# Patient Record
Sex: Female | Born: 1975 | Race: White | Hispanic: No | Smoking: Never smoker
Health system: Southern US, Community
[De-identification: ages and names within clinical notes are randomized; demographics above are authoritative.]

## PROBLEM LIST (undated history)

## (undated) DIAGNOSIS — D721 Eosinophilia: Secondary | ICD-10-CM

## (undated) DIAGNOSIS — F419 Anxiety disorder, unspecified: Secondary | ICD-10-CM

## (undated) DIAGNOSIS — J302 Other seasonal allergic rhinitis: Secondary | ICD-10-CM

## (undated) DIAGNOSIS — T7840XA Allergy, unspecified, initial encounter: Secondary | ICD-10-CM

## (undated) DIAGNOSIS — K219 Gastro-esophageal reflux disease without esophagitis: Secondary | ICD-10-CM

## (undated) DIAGNOSIS — K279 Peptic ulcer, site unspecified, unspecified as acute or chronic, without hemorrhage or perforation: Secondary | ICD-10-CM

## (undated) DIAGNOSIS — G4730 Sleep apnea, unspecified: Secondary | ICD-10-CM

## (undated) DIAGNOSIS — F32A Depression, unspecified: Secondary | ICD-10-CM

## (undated) DIAGNOSIS — D7219 Other eosinophilia: Secondary | ICD-10-CM

## (undated) HISTORY — DX: Gastro-esophageal reflux disease without esophagitis: K21.9

## (undated) HISTORY — PX: TONSILLECTOMY: SUR1361

## (undated) HISTORY — PX: FRACTURE SURGERY: SHX138

## (undated) HISTORY — PX: FOOT SURGERY: SHX648

## (undated) HISTORY — DX: Other seasonal allergic rhinitis: J30.2

## (undated) HISTORY — DX: Eosinophilia: D72.1

## (undated) HISTORY — DX: Anxiety disorder, unspecified: F41.9

## (undated) HISTORY — DX: Allergy, unspecified, initial encounter: T78.40XA

## (undated) HISTORY — DX: Depression, unspecified: F32.A

## (undated) HISTORY — DX: Other eosinophilia: D72.19

## (undated) HISTORY — PX: NASAL SINUS SURGERY: SHX719

## (undated) HISTORY — DX: Sleep apnea, unspecified: G47.30

## (undated) HISTORY — DX: Peptic ulcer, site unspecified, unspecified as acute or chronic, without hemorrhage or perforation: K27.9

---

## 2007-03-18 ENCOUNTER — Encounter: Admission: RE | Admit: 2007-03-18 | Discharge: 2007-03-18 | Payer: Self-pay | Admitting: Otolaryngology

## 2007-05-08 ENCOUNTER — Ambulatory Visit (HOSPITAL_BASED_OUTPATIENT_CLINIC_OR_DEPARTMENT_OTHER): Admission: RE | Admit: 2007-05-08 | Discharge: 2007-05-08 | Payer: Self-pay | Admitting: Otolaryngology

## 2007-05-08 ENCOUNTER — Encounter (INDEPENDENT_AMBULATORY_CARE_PROVIDER_SITE_OTHER): Payer: Self-pay | Admitting: Otolaryngology

## 2009-08-17 ENCOUNTER — Inpatient Hospital Stay (HOSPITAL_COMMUNITY): Admission: RE | Admit: 2009-08-17 | Discharge: 2009-08-18 | Payer: Self-pay | Admitting: Orthopedic Surgery

## 2010-07-31 ENCOUNTER — Encounter: Payer: Self-pay | Admitting: Gastroenterology

## 2010-07-31 ENCOUNTER — Ambulatory Visit: Payer: Self-pay | Admitting: Gastroenterology

## 2010-07-31 DIAGNOSIS — K219 Gastro-esophageal reflux disease without esophagitis: Secondary | ICD-10-CM | POA: Insufficient documentation

## 2010-08-10 ENCOUNTER — Encounter: Payer: Self-pay | Admitting: Gastroenterology

## 2010-08-10 LAB — CONVERTED CEMR LAB
Alkaline Phosphatase: 59 units/L (ref 39–117)
BUN: 9 mg/dL (ref 6–23)
CO2: 20 meq/L (ref 19–32)
Creatinine, Ser: 0.82 mg/dL (ref 0.40–1.20)
Eosinophils Absolute: 0.6 10*3/uL (ref 0.0–0.7)
Eosinophils Relative: 8 % — ABNORMAL HIGH (ref 0–5)
Free T4: 1.07 ng/dL (ref 0.80–1.80)
Glucose, Bld: 101 mg/dL — ABNORMAL HIGH (ref 70–99)
HCT: 43.7 % (ref 36.0–46.0)
Hemoglobin: 13.9 g/dL (ref 12.0–15.0)
IgA: 202 mg/dL (ref 68–378)
Lymphocytes Relative: 29 % (ref 12–46)
Lymphs Abs: 2.3 10*3/uL (ref 0.7–4.0)
MCV: 92.2 fL (ref 78.0–100.0)
Monocytes Absolute: 0.8 10*3/uL (ref 0.1–1.0)
TSH: 1.066 microintl units/mL (ref 0.350–4.500)
Tissue Transglutaminase Ab, IgA: 13.2 units (ref <20)
Total Bilirubin: 0.6 mg/dL (ref 0.3–1.2)
Total Protein: 6.5 g/dL (ref 6.0–8.3)
WBC: 8 10*3/uL (ref 4.0–10.5)

## 2010-08-16 HISTORY — PX: ESOPHAGOGASTRODUODENOSCOPY: SHX1529

## 2010-09-10 ENCOUNTER — Ambulatory Visit: Payer: Self-pay | Admitting: Gastroenterology

## 2010-09-10 ENCOUNTER — Ambulatory Visit (HOSPITAL_COMMUNITY): Admission: RE | Admit: 2010-09-10 | Discharge: 2010-09-10 | Payer: Self-pay | Admitting: Gastroenterology

## 2010-12-25 ENCOUNTER — Encounter (INDEPENDENT_AMBULATORY_CARE_PROVIDER_SITE_OTHER): Payer: Self-pay | Admitting: *Deleted

## 2011-01-15 ENCOUNTER — Ambulatory Visit
Admission: RE | Admit: 2011-01-15 | Discharge: 2011-01-15 | Payer: Self-pay | Source: Home / Self Care | Attending: Urgent Care | Admitting: Urgent Care

## 2011-01-15 DIAGNOSIS — D721 Eosinophilia: Secondary | ICD-10-CM

## 2011-01-15 DIAGNOSIS — R898 Other abnormal findings in specimens from other organs, systems and tissues: Secondary | ICD-10-CM | POA: Insufficient documentation

## 2011-01-15 DIAGNOSIS — K279 Peptic ulcer, site unspecified, unspecified as acute or chronic, without hemorrhage or perforation: Secondary | ICD-10-CM | POA: Insufficient documentation

## 2011-01-15 NOTE — Letter (Signed)
Summary: egd order  egd order   Imported By: Rosine Beat 08/10/2010 14:43:36  _____________________________________________________________________  External Attachment:    Type:   Image     Comment:   External Document

## 2011-01-15 NOTE — Assessment & Plan Note (Signed)
Summary: evaluate hiatal hernia/ss   Visit Type:  Consult Referring Provider:  Warrick Parisian Primary Care Provider:  Velna Hatchet Stallings/[facility]  Chief Complaint:  hiatal hernia evaluation.  History of Present Illness: [name] is a pleasant 35 y/o female, patient of Dr. Creta Levin, who presents for further evaluation of chronic GERD/epigastric pain. She tells me she also has 6 month h/o change in bowels.  Chronic epigastric fullness noted especially with asthma episodes. Chronic belching/hiccups. Chronic heartburn. Some regurgitation. No n/v. No dysphagia. Been on various meds for past 6 years, but most recently on ranitidine. Saw Dr. Loreta Ave early 2011, for loose stools. Had bloodwork. She notes her diarrhea improved while on prednisone given for asthma. Used to have one solid stool a day. For six months, 4-5 loose stools with fecal urgency. No melena, brbpr. No nocturnal symptoms.   Current Medications (verified): 1)  Symbicort 160-4.5 Mcg/act Aero (Budesonide-Formoterol Fumarate) .... Two Times A Day 2)  Loratadine 10 Mg Tabs (Loratadine) .... Once Daily 3)  Ranitidine Hcl 150 Mg Tabs (Ranitidine Hcl) .... Two Times A Day 4)  Ventolin Hfa 108 (90 Base) Mcg/act Aers (Albuterol Sulfate) .... As Needed 5)  Neb Tx .... As Needed  Allergies (verified): 1)  ! Jonne Ply  Past History:  Past Medical History: Allergies Asthma Chronic GERD  Past Surgical History: Sinus surgery X 2 Right foot surgery, three screws, chronic fracture Tonsillectomy  Family History: Mother, colon polyps, GERD No FH of CRC, liver dz.  Social History: Married. No children. Never smoked. No alcohol. No drug. Works for [employer].   Review of Systems General:  Denies fever, chills, sweats, anorexia, fatigue, weakness, and weight loss. Eyes:  Denies vision loss. ENT:  Denies nasal congestion, sore throat, hoarseness, and difficulty swallowing. CV:  Denies chest pains,  angina, palpitations, dyspnea on exertion, and peripheral edema. Resp:  Complains of wheezing; denies cough and sputum. GI:  See HPI. GU:  Denies urinary burning and blood in urine. MS:  Denies joint pain / LOM. Derm:  Denies rash and itching. Neuro:  Denies weakness, frequent headaches, memory loss, and confusion. Psych:  Denies depression and anxiety. Endo:  Denies unusual weight change. Heme:  Denies bruising and bleeding. Allergy:  Denies hives and rash.  Vital Signs:  Patient profile:   35 year old female Height:      61 inches Weight:      207 pounds BMI:     39.25 Temp:     98.1 degrees F oral Pulse rate:   80 / minute BP sitting:   132 / 90  (left arm) Cuff size:   regular  Vitals Entered By: Hendricks Limes LPN (July 31, 2010 10:45 AM)  Physical Exam  General:  Well developed, well nourished, no acute distress. Head:  Normocephalic and atraumatic. Eyes:  sclera nonicteric Mouth:  Oropharyngeal mucosa moist, pink.  No lesions, erythema or exudate.    Neck:  Supple; no masses or thyromegaly. Lungs:  Clear throughout to auscultation. Heart:  Regular rate and rhythm; no murmurs, rubs,  or bruits. Abdomen:  Bowel sounds normal.  Abdomen is soft, nontender, nondistended.  No rebound or guarding.  No hepatosplenomegaly, masses or hernias.  No abdominal bruits. obese.   Extremities:  No clubbing, cyanosis, edema or deformities noted. Neurologic:  Alert and  oriented x4;  grossly normal neurologically. Skin:  Intact without significant lesions or rashes. Cervical Nodes:  No significant cervical adenopathy. Psych:  Alert and cooperative. Normal mood and affect.  Impression & Recommendations:  Problem # 1:  EPIGASTRIC PAIN (ICD-789.06) Chronic GERD/indigestion. Notes epigastric fullness and pressure especially with asthma episods. She has allergies. There is concern if GERD is contributing to her asthma or if she has hiatal hernia. Suspect all symptoms GERD related but gb  remains in differential as well. EGD to be performed in near future.  Risks, alternatives, benefits including but not limited to risk of reaction to medications, bleeding, infection, and perforation addressed.  Patient voiced understanding and verbal consent obtained.   Orders: Consultation Level III (04540) T-Comprehensive Metabolic Panel [phone]) T-CBC w/Diff ([id number])  Problem # 2:  DIARRHEA (ICD-787.91) Six month h/o diarrhea. Routine labs/stools. Will check celiac screen prior to EGD. Interestingly her diarrhea improved while on prednisone given for her asthma. ?underlying IBD. May eventually need colonoscopy.  Orders: Consultation Level III [phone]) T-TSH [phone]) T-Comprehensive Metabolic Panel [id number]) T-CBC w/Diff [id number]) T-Free T4 (23300) T-Tissue Transglutamase Ab IgA ([id number]) T-igA (33295) T-Culture, C-Diff Toxin A/B ([id number]) T-Stool for O&P ([id number]) T-Fecal WBC ([id number]) T-Culture, Stool ([id number])  I would like to thank Dr. Creta Levin for allowing [place] to take part in the care of this nice patient.  Appended Document: evaluate hiatal hernia/ss ADD two times a day PPI ANF SCHEDULE EGD/BRAVO CAPSULE PLACEMENT IN ONE MONTH. STOP THE RANITIDINE.  Appended Document: evaluate hiatal hernia/ss Please see [facility] orders above. Please have pt stop ranitidine. Start Nexium 40mg  by mouth two times a day (30 mins before meals), #60 2 refills. Schedule EGD/BRAVO in one month, instead of EGD now. Will await pending stools and labs.  Appended Document: evaluate hiatal hernia/ss tried to call pt- LMOM  Appended Document: evaluate hiatal hernia/ss pt aware, already scheduled for 09/10/10, called Kim and changed to egd/bravo. [facility]

## 2011-01-17 NOTE — Letter (Signed)
Summary: Recall Office Visit  [facility]  [address]   [place], [place] [address]   Phone: [phone]  Fax: [phone]      December 25, 2010   [facility] [address] [place], [place]  [address] [birth year]   Dear Ms. [facility],   According to our records, it is time for you to schedule a follow-up office visit with [place].   At your convenience, please call [phone] to schedule an office visit. If you have any questions, concerns, or feel that this letter is in error, we would appreciate your call.   Sincerely,    Diana Eves  [facility] Ph: [phone]   Fax: [phone]

## 2011-01-18 LAB — CONVERTED CEMR LAB
Eosinophils Absolute: 0.1 10*3/uL (ref 0.0–0.7)
Lymphs Abs: 3.7 10*3/uL (ref 0.7–4.0)
MCV: 92.9 fL (ref 78.0–100.0)
Neutro Abs: 10.7 10*3/uL — ABNORMAL HIGH (ref 1.7–7.7)
Neutrophils Relative %: 64 % (ref 43–77)
Platelets: 350 10*3/uL (ref 150–400)
RBC: 5.23 M/uL — ABNORMAL HIGH (ref 3.87–5.11)
WBC: 16.6 10*3/uL — ABNORMAL HIGH (ref 4.0–10.5)

## 2011-01-23 NOTE — Assessment & Plan Note (Signed)
Summary: PP FU IN 4 MONTHS.SS   Visit Type:  Follow-up Visit Referring Provider:  same Primary Care Provider:  Dr Warrick Parisian [facility] [place])  Chief Complaint:  follow up- doing ok.  History of Present Illness: Here for follow-up EGD.  Doing well on dexilant, but expensive w/ insurance.  Pt wants generic pantoprazole.  Denies any breakthough symptoms.  Trying to lose weight, but stable.  Appetite ok.  BM normal.  Denies dysphagia or odynophagia.  Asthma better.   EOS 8% on last CBC, hx asthma/allergies.  Current Problems (verified): 1)  Eosinophilia  (ICD-288.3) 2)  Gerd  (ICD-530.81)  Current Medications (verified): 1)  Symbicort 160-4.5 Mcg/act Aero (Budesonide-Formoterol Fumarate) .... Two Times A Day 2)  Loratadine 10 Mg Tabs (Loratadine) .... Once Daily 3)  Ranitidine Hcl 150 Mg Tabs (Ranitidine Hcl) .... Two Times A Day As Needed 4)  Ventolin Hfa 108 (90 Base) Mcg/act Aers (Albuterol Sulfate) .... As Needed 5)  Neb Tx .... As Needed 6)  Pantoprazole Sodium 40 Mg Tbec (Pantoprazole Sodium) .Marland Kitchen.. 1 By Mouth Daily For Acid Refulx  Allergies (verified): 1)  ! Jonne Ply  Past History:  Past Medical History: Allergies Asthma peripheral eosinophilia suspected secondary to above Chronic GERD PUD Last EGD 08/2010->Normal esophagus, 3 1-mm superficial gastric ulcers seen in the antrum, Diffuse erythema in the antrum. No evidence h pylori. Normal duodenal bulb, ampulla, and second portion of the duodenum.  Past Surgical History: Reviewed history from 07/31/2010 and no changes required. Sinus surgery X 2 Right foot surgery, three screws, chronic fracture Tonsillectomy  Family History: Reviewed history from 07/31/2010 and no changes required. Mother, colon polyps, GERD No FH of CRC, liver dz.  Social History: Reviewed history from 07/31/2010 and no changes required. Married. No children. Never smoked. No alcohol. No drug. Works for [employer].   Review  of Systems      See HPI General:  Denies fever, chills, sweats, anorexia, fatigue, weakness, malaise, weight loss, and sleep disorder. CV:  Denies chest pains, angina, palpitations, syncope, dyspnea on exertion, orthopnea, PND, peripheral edema, and claudication. Resp:  Denies dyspnea at rest, dyspnea with exercise, cough, sputum, wheezing, coughing up blood, and pleurisy. GI:  Denies difficulty swallowing, pain on swallowing, nausea, indigestion/heartburn, vomiting, vomiting blood, abdominal pain, jaundice, gas/bloating, diarrhea, constipation, change in bowel habits, bloody BM's, black BMs, and fecal incontinence. GU:  Denies urinary burning, blood in urine, nocturnal urination, urinary frequency, and urinary incontinence. MS:  Denies joint pain / LOM, joint swelling, joint stiffness, joint deformity, low back pain, muscle weakness, muscle cramps, muscle atrophy, leg pain at night, leg pain with exertion, and shoulder pain / LOM hand / wrist pain (CTS). Derm:  Denies rash, itching, dry skin, hives, moles, warts, and unhealing ulcers. Psych:  Denies depression, anxiety, memory loss, suicidal ideation, hallucinations, paranoia, phobia, and confusion. Heme:  Denies bruising, bleeding, and enlarged lymph nodes.  Vital Signs:  Patient profile:   35 year old female Height:      61 inches Weight:      205 pounds BMI:     38.87 Temp:     98.9 degrees F oral Pulse rate:   80 / minute BP sitting:   122 / 80  (left arm) Cuff size:   large  Vitals Entered By: Hendricks Limes LPN (January 15, 2011 10:35 AM)  Physical Exam  General:  Well developed, well nourished, no acute distress. Head:  Normocephalic and atraumatic. Eyes:  Sclera clear, no icterus. Ears:  Normal auditory acuity. Nose:  No deformity, discharge,  or lesions. Mouth:  No deformity or lesions, dentition normal. Lungs:  Clear throughout to auscultation. Heart:  Regular rate and rhythm; no murmurs, rubs,  or bruits. Abdomen:  Obese  Soft, nontender and nondistended. No masses, hepatosplenomegaly or hernias noted. Normal bowel sounds.without guarding and without rebound.   Msk:  Symmetrical with no gross deformities. Normal posture. Extremities:  No clubbing, cyanosis, edema or deformities noted. Neurologic:  Alert and  oriented x4;  grossly normal neurologically. Skin:  Intact without significant lesions or rashes. Cervical Nodes:  No significant cervical adenopathy. Psych:  Alert and cooperative. Normal mood and affect.  Impression & Recommendations:  Problem # 1:  GERD (ICD-530.81) Here for FU EGD.  Doing well, requests change in PPI for insurance purposes.  Orders: Est. Patient Level III (16010)  Problem # 2:  PUD (ICD-533.90) Doing well.  No evidence h pylori, no NSAIDs.  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  Other Orders: T-CBC w/Diff [id number])  Patient Instructions: 1)  Change to pantoprazole 2)  Call if any problems, otherwise office visit 1 yr Dr Darrick Penna 3)  Continue gradual weight loss Prescriptions: PANTOPRAZOLE SODIUM 40 MG TBEC (PANTOPRAZOLE SODIUM) 1 by mouth daily for acid refulx  #31 x 11   Entered and Authorized by:   Joselyn Arrow FNP-BC   Signed by:   Joselyn Arrow FNP-BC on 01/15/2011   Method used:   [facility]        [facility] [phone]* (retail)       [address]       [place], [place]  [address]       Ph: [phone] or [phone]       Fax: [phone]   RxID:   [id number]   Appended Document: PP FU IN 4 MONTHS.SS 1 YR F/U OPV IS IN THE COMPUTER

## 2011-03-22 LAB — COMPREHENSIVE METABOLIC PANEL
ALT: 23 U/L (ref 0–35)
AST: 26 U/L (ref 0–37)
CO2: 25 mEq/L (ref 19–32)
Chloride: 104 mEq/L (ref 96–112)
Creatinine, Ser: 0.81 mg/dL (ref 0.4–1.2)
GFR calc Af Amer: >60 mL/min (ref >60)
GFR calc non Af Amer: >60 mL/min (ref >60)
Glucose, Bld: 115 mg/dL — ABNORMAL HIGH (ref 70–99)
Sodium: 136 mEq/L (ref 135–145)
Total Bilirubin: 1 mg/dL (ref 0.3–1.2)

## 2011-03-22 LAB — CBC
Hemoglobin: 14.4 g/dL (ref 12.0–15.0)
MCV: 93.9 fL (ref 78.0–100.0)
RBC: 4.51 MIL/uL (ref 3.87–5.11)
WBC: 9.2 10*3/uL (ref 4.0–10.5)

## 2011-04-30 NOTE — Op Note (Signed)
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   PROCEDURE:  Right foot triple arthrodesis.   SURGEON:  Nadara Mustard, MD   ANESTHESIA:  General plus popliteal block.   ESTIMATED BLOOD LOSS:  Minimal.   ANTIBIOTICS:  Kefzol 1 g.   DRAINS:  None.   COMPLICATIONS:  None.   TOURNIQUET TIME:  Esmarch at the ankle for approximately 50 minutes.   DISPOSITION:  To PACU in stable condition.   INDICATIONS FOR PROCEDURE:  The patient is a 35 year old woman with  posterior tibial tendon insufficiency on the right.  She has essentially  no subtalar motion due to the posterior tibial insufficiency with  subtalar arthrosis.  She has a BMI greater than 40 and has failed  conservative care.  She presents at this time for right foot triple  arthrodesis.  Risks and benefits were discussed including infection,  neurovascular injury, persistent pain, nonunion, and need for additional  surgery.  The patient states she understands and wished proceed at this  time.   DESCRIPTION OF PROCEDURE:  The patient was brought to OR room-4.  After  undergoing a popliteal block, she then underwent a general anesthetic.  After adequate level of anesthesia was obtained, the patient's right  lower extremity was prepped using DuraPrep and draped into a sterile  field.  An oblique incision was made in line with the skin creases over  the sinus tarsi, this was carried sharply through the skin.  The  extensor  muscles were elevated from proximal to distal and reflected in  one block of tissue.  The calcaneocuboid joint was denuded of cartilage  and the subtalar joint was also debrided of cartilage.  This was  debrided back to bleeding viable subchondral bone.  A separate incision  was made over the anterior tibial tendon.  Blunt dissection was carried  down medial to the anterior tibial tendon and the talonavicular joint  was debrided of articular cartilage.  The wounds were irrigated with  normal saline.  After further debridement, the foot was then held at  neutral dorsiflexion with about 10 degrees of valgus and a guidewire was  inserted from the calcaneus into the talus, this was verified with C-arm  fluoroscopy.  The skin was incised and a 7.3 cannulated screw of 70 mm  in length was placed to stabilize the subtalar joint.  Attention was  then focused on the talonavicular joint with the first column and  alignment.  A 3.5 cortical screw was then used to stabilize the  talonavicular joint from the navicular to the  talus.  A separate small  stab incision was then made proximal to the lateral incision and a 2.5  drill bit was used to drill across the calcaneus into the cuboid and  this was then stabilized with a 30-mm 3.5 cortical screw.  A C-arm  fluoroscopy verified reduction in both AP and lateral planes.  The  wounds were irrigated with normal saline and the arthrodesis sites were  then packed with graft on demineralized bone matrix and a cancellus bone  chips.  The reflected extensor muscles were then reattached with 2-0  Vicryl reapproximating through the fascia.  The subcu was closed using 2-  0 Vicryl and the skin was closed using 2-0 nylon with a modified  vertical mattress suture.  The wounds were covered with Adaptic  orthopedic sponges, ABD dressing, Webril, and Coban.  The patient was  extubated and taken to PACU in stable condition.      Nadara Mustard, MD  Electronically  Signed     MVD/MEDQ  D:  08/17/2009  T:  08/18/2009  Job:  [id number]

## 2011-05-03 NOTE — Op Note (Signed)
Katrina Roman, Katrina Roman                ACCOUNT NO.:  [id number]   MEDICAL RECORD NO.:  [id number]          PATIENT TYPE:  AMB   LOCATION:  DSC                          FACILITY:  [facility]   PHYSICIAN:  Christopher E. 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   PROCEDURE:  Functional endoscopic sinus surgery with bilateral  ethmoidectomies with removal of sinonasal polyps bilaterally, maxillary  ostial enlargements with removal of sinonasal polyps, and bilateral  sphenoidotomies with removal of sinonasal polyps.   SURGEON:  Kristine Garbe. Ezzard Standing, M.D.   ANESTHESIA:  General endotracheal.   COMPLICATIONS:  None.   CLINICAL NOTE:  Katrina Roman is a 35 year old female with history of  allergies as well as history of nasal polyps.  She has had several  previous surgeries for recurrence of nasal polyps, the last time in [place]  in September of 2004.  She is having recurrent problems with  obstruction, infections, and headaches.  On exam, she has recurrence of  sinonasal polyps.  CT scan shows fairly extensive polyps involving  predominantly the ethmoid area bilaterally as well as obstructing the  sphenoid maxillary ostia, and she has total opacification of both  frontal sinuses.  She is taken to the operating room at this time for  endoscopic removal of sinonasal polyps.   DESCRIPTION OF PROCEDURE:  After adequate endotracheal anesthesia, the  nose was prepped with Betadine solution and draped out with sterile  towels.  The patient received 1 gram Ancef and 10 mg of Decadron IV  preoperatively.   Using the 0 and 30-degree scopes and the microdebrider, first the right  side was approached.  A portion of the right middle turbinate had  been  previously amputated, and some of it anteriorly was adhered to the  lateral ethmoid wall.  These adhesions were broken up, and the middle  turbinate was outfractured.  She had fairly extensive polyps involving  the ethmoid area extending back to a sphenoidotomy that had previously  been created.  Using the microdebrider, these polyps were removed.  The  maxillary ostia was identified, and this was still open but had several  polypoid disease around the ostia, and these were removed with a  microdebrider.  After cleaning out the ethmoid of the recurrent polyps  as well as the sphenoidotomy and maxillary ostia, the nasal frontal area  was approached.  Using the microdebrider and through-cut grasping  forceps.  The lower portion of the nasal frontal duct was opened up.  Because of previous scar tissue, it was a little bit difficult to  definitely identify definitive nasal frontal duct area, so just the  lower portion of nasal frontal area was opened up, and no probes were  really forced into the sinus region.  Next, the left side was  approached.  Again, Katrina Roman had several large recurrent polyps within the  anterior  and posterior ethmoid area as well as the maxillary ostia and  sphenoid ostia that had been previously created.  These polyps were  removed with the microdebrider.  Care was taken to preserve the lamina  and the roof of the ethmoid.  Sphenoid ostia was opened up by removing  the polyps obstructing the ostia as was the maxillary ostia.  Again, the  lower portion of the nasal frontal area was opened up with the  microdebrider.  There were a few anterior ethmoid cells that were opened  up likewise with up through-cut forceps.  This completed the procedure.  The nose was injected with 80 mg of Depo-Medrol at the completion of the  case.  Kennedy sinus packs were placed in the ethmoid areas bilaterally.  Of note, the lower portion of the nasal frontal areas were opened up   bilaterally, but the nasal frontal duct was not really explored into the  frontal sinus area.   Katrina Roman was awakened from anesthesia and transferred to the recovery room  postoperatively doing well.   DISPOSITION:  Katrina Roman is discharged home later this morning on Keflex 5 mg  b.i.d. for 1 week, Tylenol and Vicodin p.r.n. pain.  I will have her  followup in my office in 4 days for recheck to have the Kennedy sinus  packs removed.           ______________________________  Kristine Garbe Ezzard Standing, M.D.     CEN/MEDQ  D:  05/08/2007  T:  05/08/2007  Job:  [id number]   cc:   Ace Gins, MD

## 2011-12-23 ENCOUNTER — Encounter: Payer: Self-pay | Admitting: Gastroenterology

## 2014-05-12 ENCOUNTER — Institutional Professional Consult (permissible substitution): Payer: Self-pay | Admitting: Emergency Medicine

## 2014-05-17 ENCOUNTER — Encounter (INDEPENDENT_AMBULATORY_CARE_PROVIDER_SITE_OTHER): Payer: Self-pay

## 2014-05-17 ENCOUNTER — Encounter: Payer: Self-pay | Admitting: Emergency Medicine

## 2014-05-17 ENCOUNTER — Ambulatory Visit (INDEPENDENT_AMBULATORY_CARE_PROVIDER_SITE_OTHER): Payer: 59 | Admitting: Emergency Medicine

## 2014-05-17 VITALS — BP 132/80 | HR 109 | Temp 98.5°F | Ht 61.0 in | Wt 247.0 lb

## 2014-05-17 DIAGNOSIS — J45909 Unspecified asthma, uncomplicated: Secondary | ICD-10-CM

## 2014-05-17 DIAGNOSIS — R059 Cough, unspecified: Secondary | ICD-10-CM

## 2014-05-17 DIAGNOSIS — R05 Cough: Secondary | ICD-10-CM

## 2014-05-17 MED ORDER — HYDROCODONE-HOMATROPINE 5-1.5 MG/5ML PO SYRP: 5.0000 mL | ORAL_SOLUTION | Freq: Four times a day (QID) | ORAL | Status: DC | PRN

## 2014-05-17 NOTE — Assessment & Plan Note (Addendum)
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  - stop symbicort as a potential UA irritant - keep albuterol available to use prn - will work to manage GERD and PND more tightly - full PFT in the future once this flare has improved

## 2014-05-17 NOTE — Progress Notes (Signed)
Subjective:    Patient ID: Katrina Roman, female    DOB: Apr 18, 1976, 38 y.o.   MRN: [id number]  HPI 38 yo never smoker, hx allergies, GERD, asthma with peripheral eosinophilia, dx at age 82. Referred by Dr Deatra Ina for asthma. She has a hx of ASA sensitivity, also multiple sinus surgeries.  She has been less well controlled since Dec '14 when she may have had URI, has not been able to get back to baseline. At her baseline barely ever needs albuterol. Currently using it 2-3x a day.    Review of Systems  Constitutional: Negative for fever and unexpected weight change.  HENT: Negative for congestion, dental problem, ear pain, nosebleeds, postnasal drip, rhinorrhea, sinus pressure, sneezing, sore throat and trouble swallowing.   Eyes: Negative for redness and itching.  Respiratory: Positive for cough, shortness of breath and wheezing. Negative for chest tightness.   Cardiovascular: Negative for palpitations and leg swelling.  Gastrointestinal: Negative for nausea and vomiting.  Genitourinary: Negative for dysuria.  Musculoskeletal: Negative for joint swelling.  Skin: Negative for rash.  Neurological: Negative for headaches.  Hematological: Does not bruise/bleed easily.  Psychiatric/Behavioral: Negative for dysphoric mood. The patient is not nervous/anxious.    Past Medical History  Diagnosis Date  . Seasonal allergies   . Asthma   . Peripheral eosinophilia   . GERD (gastroesophageal reflux disease)   . PUD (peptic ulcer disease)      Family History  Problem Relation Age of Onset  . Skin cancer Maternal Grandfather   . Hypertension Father   . Hypertension Mother   . Diabetes Father      History   Social History  . Marital Status: Married    Spouse Name: N/A    Number of Children: N/A  . Years of Education: N/A   Occupational History  . [place] History Main Topics  . Smoking status: Never Smoker   . Smokeless tobacco: Not on file  .  Alcohol Use: No  . Drug Use: No  . Sexual Activity: Not on file   Other Topics Concern  . Not on file   Social History Narrative  . No narrative on file     Allergies  Allergen Reactions  . Aspirin     REACTION: affects asthma     Outpatient Prescriptions Prior to Visit  Medication Sig Dispense Refill  . albuterol (VENTOLIN HFA) 108 (90 BASE) MCG/ACT inhaler Inhale 2 puffs into the lungs every 6 (six) hours as needed.        . budesonide-formoterol (SYMBICORT) 160-4.5 MCG/ACT inhaler Inhale 2 puffs into the lungs 2 (two) times daily.        Marland Kitchen loratadine (CLARITIN) 10 MG tablet Take 10 mg by mouth daily.        . pantoprazole (PROTONIX) 40 MG tablet Take 40 mg by mouth daily.        . ranitidine (ZANTAC) 150 MG capsule Take 150 mg by mouth 2 (two) times daily.         No facility-administered medications prior to visit.         Objective:   Physical Exam Filed Vitals:   05/17/14 1619  BP: 132/80  Pulse: 109  Temp: 98.5 F (36.9 C)  TempSrc: Oral  Height: 5' 1"  (1.549 m)  Weight: 247 lb (112.038 kg)  SpO2: 96%   Gen: Pleasant, overwt, in no distress,  normal affect  ENT: No lesions,  mouth clear,  oropharynx clear,  some mild nasal congestion, hoarse voice  Neck: No JVD, no TMG, no carotid bruits, no stridor  Lungs: No use of accessory muscles, clear without rales or rhonchi, no wheeze  Cardiovascular: RRR, heart sounds normal, no murmur or gallops, no peripheral edema  Musculoskeletal: No deformities, no cyanosis or clubbing  Neuro: alert, non focal  Skin: Warm, no lesions or rashes     Assessment & Plan:  Intrinsic asthma Poorer control for the last 6 months - some of this sounds like UA sx and cyclical cough.  - stop symbicort as a potential UA irritant - keep albuterol available to use prn - will work to manage GERD and PND more tightly - full PFT in the future once this flare has improved  Cough - increase PPI temporarily, add NSW and nasal  steroid.  - discussed voice rest, may need to do this at some point - hycodan for cough suppression - consider FOB in the future if sx continue

## 2014-05-17 NOTE — Assessment & Plan Note (Signed)
-   increase PPI temporarily, add NSW and nasal steroid.  - discussed voice rest, may need to do this at some point - hycodan for cough suppression - consider FOB in the future if sx continue

## 2014-05-17 NOTE — Patient Instructions (Addendum)
Please stop Symbicort for now Continue to have your albuterol available to use as needed Increase your pantoprazole to twice a day for the next 2 weeks  Start nasonex 2 sprays each nostril daily until next visit Continue your zantac and loratadine as you are taking them Start nasal saline washes daily We will perform full pulmonary function testing in the future when your symptoms subside.  Use hycodan 5cc up to every 6 hours if needed for cough Follow with Dr Lamonte Sakai in 1 month

## 2014-05-23 ENCOUNTER — Telehealth: Payer: Self-pay | Admitting: Emergency Medicine

## 2014-05-23 MED ORDER — PREDNISONE 10 MG PO TABS: ORAL_TABLET | ORAL | Status: DC

## 2014-05-23 MED ORDER — DOXYCYCLINE HYCLATE 100 MG PO TABS: 100.0000 mg | ORAL_TABLET | Freq: Two times a day (BID) | ORAL | Status: DC

## 2014-05-23 NOTE — Telephone Encounter (Signed)
Please order  - pred taper: Take 16m daily for 3 days, then 353mdaily for 3 days, then 2061maily for 3 days, then 109m70mily for 3 days, then stop - doxycycline 100mg75m x 7 days - ask her to try using temporary symptomatic relief like OTC Tylenol cold & flu, etc.  - make sure we made a follow up visit within a month to review sx

## 2014-05-23 NOTE — Telephone Encounter (Signed)
Pt is aware of recs. RX sent in. Nothing further needed

## 2014-05-23 NOTE — Telephone Encounter (Signed)
Per OV 05/17/14: Patient Instructions      Please stop Symbicort for now Continue to have your albuterol available to use as needed Increase your pantoprazole to twice a day for the next 2 weeks   Start nasonex 2 sprays each nostril daily until next visit Continue your zantac and loratadine as you are taking them Start nasal saline washes daily We will perform full pulmonary function testing in the future when your symptoms subside.   Use hycodan 5cc up to every 6 hours if needed for cough Follow with Dr Lamonte Sakai in 1 month  ---  Called spoke with pt. C/o lots of nasal/chest cong, prod cough-yellow-green phlem, wheezing/chest tx. She had to do breathing TX this AM. Took mucinex couple weeks ago but did not help so stopped taking it. Still doing all recs as stated above. Please advise thanks  Allergies  Allergen Reactions  . Aspirin     REACTION: affects asthma

## 2014-06-13 ENCOUNTER — Other Ambulatory Visit: Payer: Self-pay | Admitting: Emergency Medicine

## 2014-06-13 ENCOUNTER — Telehealth: Payer: Self-pay | Admitting: Emergency Medicine

## 2014-06-13 NOTE — Telephone Encounter (Signed)
Spoke with pt and advised that pred taper was sent to pharm. Pt verbalized understanding and will call back if not better

## 2014-06-22 ENCOUNTER — Encounter: Payer: Self-pay | Admitting: Emergency Medicine

## 2014-06-22 ENCOUNTER — Ambulatory Visit (INDEPENDENT_AMBULATORY_CARE_PROVIDER_SITE_OTHER): Payer: 59 | Admitting: Emergency Medicine

## 2014-06-22 VITALS — BP 172/90 | HR 99 | Ht 61.0 in | Wt 249.4 lb

## 2014-06-22 DIAGNOSIS — J45909 Unspecified asthma, uncomplicated: Secondary | ICD-10-CM

## 2014-06-22 NOTE — Progress Notes (Addendum)
Subjective:    Patient ID: Katrina Roman, female    DOB: [birth year], 38 y.o.   MRN: [id number]  HPI 38 yo never smoker, hx allergies, GERD, asthma with peripheral eosinophilia, dx at age 16. Referred by Dr Deatra Ina for asthma. She has a hx of ASA sensitivity, also multiple sinus surgeries.  She has been less well controlled since Dec '14 when she may have had URI, has not been able to get back to baseline. At her baseline barely ever needs albuterol. Currently using it 2-3x a day.   ROV 06/22/14 -- follows up for asthma. Has had poor control, suspect UA irritation superimposed on her asthma. We increased her PPI, added nasal washes and steroid. She reports that she has been given pred a few times for persistent throat noise and mucous. She is on nasal steroid, NSW, PPI qd. Not on immunotherapy.   Review of Systems  Constitutional: Negative for fever and unexpected weight change.  HENT: Negative for congestion, dental problem, ear pain, nosebleeds, postnasal drip, rhinorrhea, sinus pressure, sneezing, sore throat and trouble swallowing.   Eyes: Negative for redness and itching.  Respiratory: Positive for cough, shortness of breath and wheezing. Negative for chest tightness.   Cardiovascular: Negative for palpitations and leg swelling.  Gastrointestinal: Negative for nausea and vomiting.  Genitourinary: Negative for dysuria.  Musculoskeletal: Negative for joint swelling.  Skin: Negative for rash.  Neurological: Negative for headaches.  Hematological: Does not bruise/bleed easily.  Psychiatric/Behavioral: Negative for dysphoric mood. The patient is not nervous/anxious.    Past Medical History  Diagnosis Date  . Seasonal allergies   . Asthma   . Peripheral eosinophilia   . GERD (gastroesophageal reflux disease)   . PUD (peptic ulcer disease)      Family History  Problem Relation Age of Onset  . Skin cancer Maternal Grandfather   . Hypertension Father   . Hypertension Mother   . Diabetes  Father      History   Social History  . Marital Status: Married    Spouse Name: N/A    Number of Children: N/A  . Years of Education: N/A   Occupational History  . [place] History Main Topics  . Smoking status: Never Smoker   . Smokeless tobacco: Not on file  . Alcohol Use: No  . Drug Use: No  . Sexual Activity: Not on file   Other Topics Concern  . Not on file   Social History Narrative  . No narrative on file     Allergies  Allergen Reactions  . Aspirin     REACTION: affects asthma     Outpatient Prescriptions Prior to Visit  Medication Sig Dispense Refill  . albuterol (VENTOLIN HFA) 108 (90 BASE) MCG/ACT inhaler Inhale 2 puffs into the lungs every 6 (six) hours as needed.        . budesonide-formoterol (SYMBICORT) 160-4.5 MCG/ACT inhaler Inhale 2 puffs into the lungs 2 (two) times daily.        Marland Kitchen loratadine (CLARITIN) 10 MG tablet Take 10 mg by mouth daily.        . pantoprazole (PROTONIX) 40 MG tablet Take 40 mg by mouth daily.        . predniSONE (DELTASONE) 10 MG tablet 4TABS X 3DAYS--3TABS X 3 DAYS--2TABS X 3 DAYS--1 TAB X 3 DAYS--THEN STOP  30 tablet  0  . ranitidine (ZANTAC) 150 MG capsule Take 150 mg by mouth 2 (two) times daily.        Marland Kitchen  doxycycline (VIBRA-TABS) 100 MG tablet Take 1 tablet (100 mg total) by mouth 2 (two) times daily.  14 tablet  0  . HYDROcodone-homatropine (HYCODAN) 5-1.5 MG/5ML syrup Take 5 mLs by mouth every 6 (six) hours as needed for cough.  240 mL  0   No facility-administered medications prior to visit.         Objective:   Physical Exam Filed Vitals:   06/22/14 1544  BP: 172/90  Pulse: 99  Height: 5' 1"  (1.549 m)  Weight: 249 lb 6.4 oz (113.127 kg)  SpO2: 96%   Gen: Pleasant, overwt, in no distress,  normal affect  ENT: No lesions,  mouth clear,  oropharynx clear, some mild nasal congestion, hoarse voice  Neck: No JVD, no TMG, no carotid bruits, no stridor  Lungs: No use of  accessory muscles, clear without rales or rhonchi, no wheeze  Cardiovascular: RRR, heart sounds normal, no murmur or gallops, no peripheral edema  Musculoskeletal: No deformities, no cyanosis or clubbing  Neuro: alert, non focal  Skin: Warm, no lesions or rashes     Assessment & Plan:  Intrinsic asthma She has asthma, but severity unclear to me - most of her recent problems have been consistent with UA irritation. No real improvement with GERD and allergy regimens. She may need to restart allergy shots. We will get full PFT, follow her sx, avoid throat clearing.   We will perform full Pulmonary Function Testing at your next visit Please continue your current medications as you are taking them Finish your prednisone as planned Try hard to avoid throat clearing and coughing Follow with Dr Lamonte Sakai next available with full PFT

## 2014-06-22 NOTE — Assessment & Plan Note (Addendum)
She has asthma, but severity unclear to me - most of her recent problems have been consistent with UA irritation. No real improvement with GERD and allergy regimens. She may need to restart allergy shots. We will get full PFT, follow her sx, avoid throat clearing.   We will perform full Pulmonary Function Testing at your next visit Please continue your current medications as you are taking them Finish your prednisone as planned Try hard to avoid throat clearing and coughing Follow with Dr Lamonte Sakai next available with full PFT

## 2014-06-22 NOTE — Patient Instructions (Signed)
We will perform full Pulmonary Function Testing at your next visit Please continue your current medications as you are taking them Finish your prednisone as planned Try hard to avoid throat clearing and coughing Follow with Dr Lamonte Sakai next available with full PFT

## 2014-06-29 ENCOUNTER — Telehealth: Payer: Self-pay | Admitting: Emergency Medicine

## 2014-06-29 MED ORDER — MONTELUKAST SODIUM 10 MG PO TABS: 10.0000 mg | ORAL_TABLET | Freq: Every day | ORAL | Status: DC

## 2014-06-29 MED ORDER — PREDNISONE 10 MG PO TABS: ORAL_TABLET | ORAL | Status: DC

## 2014-06-29 NOTE — Telephone Encounter (Signed)
Called and spoke with pt and she is aware of RB recs.  She stated that she has a pending appt with RB next month and will discuss at this time.

## 2014-06-29 NOTE — Telephone Encounter (Signed)
Ask her to take prednisone taper > Take 15m daily for 3 days, then 348mdaily for 3 days, then 2075maily for 3 days, then 8m1mily for 3 days, then stop. Also tell her that i believe we need to break the cycle of recurrent pred use. Would like to add singulair 8mg68m, and we will have to consider going back on allergy shots or a more extensive allergy workup

## 2014-06-29 NOTE — Telephone Encounter (Signed)
Pt returning caLL.Katrina Roman

## 2014-06-29 NOTE — Telephone Encounter (Signed)
Pt saw RB 06/22/14. Spoke with pt. She c/o increased chest cong causing her to have hard time to breathe. She is not coughing up any phlem. Congestion seems worse at bedtime. Also c/o chest tx, wheeszing. She has tried tylenol cold/sinus. Did breathing TX and does not help. Pt has finished prednisone. Please advise RB thanks  Allergies  Allergen Reactions  . Aspirin     REACTION: affects asthma     Current Outpatient Prescriptions on File Prior to Visit  Medication Sig Dispense Refill  . albuterol (VENTOLIN HFA) 108 (90 BASE) MCG/ACT inhaler Inhale 2 puffs into the lungs every 6 (six) hours as needed.        . budesonide-formoterol (SYMBICORT) 160-4.5 MCG/ACT inhaler Inhale 2 puffs into the lungs 2 (two) times daily.        Marland Kitchen loratadine (CLARITIN) 10 MG tablet Take 10 mg by mouth daily.        . pantoprazole (PROTONIX) 40 MG tablet Take 40 mg by mouth daily.        . predniSONE (DELTASONE) 10 MG tablet 4TABS X 3DAYS--3TABS X 3 DAYS--2TABS X 3 DAYS--1 TAB X 3 DAYS--THEN STOP  30 tablet  0  . ranitidine (ZANTAC) 150 MG capsule Take 150 mg by mouth 2 (two) times daily.         No current facility-administered medications on file prior to visit.

## 2014-06-29 NOTE — Telephone Encounter (Signed)
[id number] x1 

## 2014-07-19 ENCOUNTER — Telehealth: Payer: Self-pay | Admitting: Emergency Medicine

## 2014-07-19 NOTE — Telephone Encounter (Signed)
Called spoke with pt. appt scheduled to see MW in AM. Nothing further needed

## 2014-07-20 ENCOUNTER — Encounter: Payer: Self-pay | Admitting: Internal Medicine

## 2014-07-20 ENCOUNTER — Ambulatory Visit (INDEPENDENT_AMBULATORY_CARE_PROVIDER_SITE_OTHER): Payer: 59 | Admitting: Internal Medicine

## 2014-07-20 ENCOUNTER — Other Ambulatory Visit (INDEPENDENT_AMBULATORY_CARE_PROVIDER_SITE_OTHER): Payer: 59

## 2014-07-20 ENCOUNTER — Telehealth: Payer: Self-pay | Admitting: Internal Medicine

## 2014-07-20 VITALS — BP 120/82 | HR 108 | Temp 97.8°F | Ht 61.0 in | Wt 250.0 lb

## 2014-07-20 DIAGNOSIS — J45909 Unspecified asthma, uncomplicated: Secondary | ICD-10-CM

## 2014-07-20 LAB — CBC WITH DIFFERENTIAL/PLATELET
BASOS ABS: 0.1 10*3/uL (ref 0.0–0.1)
Basophils Relative: 0.8 % (ref 0.0–3.0)
EOS PCT: 7 % — AB (ref 0.0–5.0)
Eosinophils Absolute: 0.7 10*3/uL (ref 0.0–0.7)
HEMATOCRIT: 39.8 % (ref 36.0–46.0)
Hemoglobin: 13.1 g/dL (ref 12.0–15.0)
LYMPHS PCT: 23.3 % (ref 12.0–46.0)
Lymphs Abs: 2.4 10*3/uL (ref 0.7–4.0)
MCHC: 32.9 g/dL (ref 30.0–36.0)
MCV: 91.6 fl (ref 78.0–100.0)
MONOS PCT: 7 % (ref 3.0–12.0)
Monocytes Absolute: 0.7 10*3/uL (ref 0.1–1.0)
NEUTROS PCT: 61.9 % (ref 43.0–77.0)
Neutro Abs: 6.4 10*3/uL (ref 1.4–7.7)
PLATELETS: 333 10*3/uL (ref 150.0–400.0)
RBC: 4.34 Mil/uL (ref 3.87–5.11)
RDW: 14.1 % (ref 11.5–15.5)
WBC: 10.3 10*3/uL (ref 4.0–10.5)

## 2014-07-20 MED ORDER — MOMETASONE FURO-FORMOTEROL FUM 100-5 MCG/ACT IN AERO: INHALATION_SPRAY | RESPIRATORY_TRACT | Status: DC

## 2014-07-20 MED ORDER — RANITIDINE HCL 150 MG PO CAPS: 300.0000 mg | ORAL_CAPSULE | Freq: Every evening | ORAL | Status: DC

## 2014-07-20 MED ORDER — PREDNISONE 10 MG PO TABS: ORAL_TABLET | ORAL | Status: DC

## 2014-07-20 MED ORDER — PANTOPRAZOLE SODIUM 40 MG PO TBEC: DELAYED_RELEASE_TABLET | ORAL | Status: DC

## 2014-07-20 NOTE — Patient Instructions (Signed)
Prednisone 10 mg take  4 each am x 2 days,   2 each am x 2 days,  1 each am x 2 days and stop  Start trial of dulera 100 Take 2 puffs first thing in am and then another 2 puffs about 12 hours later.   Pantoprazole (protonix) 40 mg   Take 30-60 min before first meal of the day and Zantac 150 mg two at bedtime until return to office - this is the best way to tell whether stomach acid is contributing to your problem.    GERD (REFLUX)  is an extremely common cause of respiratory symptoms, many times with no significant heartburn at all.    It can be treated with medication, but also with lifestyle changes including avoidance of late meals, excessive alcohol, smoking cessation, and avoid fatty foods, chocolate, peppermint, colas, red wine, and acidic juices such as orange juice.  NO MINT OR MENTHOL PRODUCTS SO NO COUGH DROPS  USE SUGARLESS CANDY INSTEAD (jolley ranchers or Stover's)  NO OIL BASED VITAMINS - use powdered substitutes.    Keep appt with Dr Lamonte Sakai

## 2014-07-20 NOTE — Progress Notes (Signed)
Subjective:    Patient ID: Katrina Roman, female    DOB: [birth year]  MRN: [id number]  HPI 39 yo never smoker, hx allergies, GERD, asthma with peripheral eosinophilia, dx at age 63. Referred by Dr Deatra Ina for asthma. She has a hx of ASA sensitivity, also multiple sinus surgeries.  She has been less well controlled since Dec '14 when she may have had URI, has not been able to get back to baseline. At her baseline barely ever needs albuterol.    ROV 06/22/14 -- follows up for asthma. Has had poor control, suspect UA irritation superimposed on her asthma. We increased her PPI, added nasal washes and steroid. She reports that she has been given pred a few times for persistent throat noise and mucous. She is on nasal steroid, NSW, PPI qd. Not on immunotherapy.  rec We will perform full Pulmonary Function Testing at your next visit Please continue your current medications as you are taking them [facility] your prednisone as planned Try hard to avoid throat clearing and coughing Follow with Dr Lamonte Sakai next available with full P   07/20/2014  Acute ov/Wert re: severe cough responsive to steroids   Chief Complaint  Patient presents with  . Acute Visit    Pt c/o wheezing, chest tightness, SOB and cough since Dec 2014. Cough is non prod. Using rescue inhaler 3-4 x per day.   on prednisone no cough no wheeze no need for rescue including one month prior to OV  And benefit lasts a week then gradually declines - this is a new pattern since dec 2014. Cough remains dry and night = day  No obvious day to day or daytime variabilty or assoc  cp or chest tightness, subjective wheeze overt sinus or hb symptoms. No unusual exp hx or h/o childhood pna/  or knowledge of premature birth.   Also denies any obvious fluctuation of symptoms with weather or environmental changes or other aggravating or alleviating factors except as outlined above   Current Medications, Allergies, Complete Past Medical History, Past Surgical  History, Family History, and Social History were reviewed in [employer] record.  ROS  The following are not active complaints unless bolded sore throat, dysphagia, dental problems, itching, sneezing,  nasal congestion or excess/ purulent secretions, ear ache,   fever, chills, sweats, unintended wt loss, pleuritic or exertional cp, hemoptysis,  orthopnea pnd or leg swelling, presyncope, palpitations, heartburn, abdominal pain, anorexia, nausea, vomiting, diarrhea  or change in bowel or urinary habits, change in stools or urine, dysuria,hematuria,  rash, arthralgias, visual complaints, headache, numbness weakness or ataxia or problems with walking or coordination,  change in mood/affect or memory.       .    Past Medical History  Diagnosis Date  . Seasonal allergies   . Asthma   . Peripheral eosinophilia   . GERD (gastroesophageal reflux disease)   . PUD (peptic ulcer disease)      Family History  Problem Relation Age of Onset  . Skin cancer Maternal Grandfather   . Hypertension Father   . Hypertension Mother   . Diabetes Father      History   Social History  . Marital Status: Married    Spouse Name: N/A    Number of Children: N/A  . Years of Education: N/A   Occupational History  . [place] History Main Topics  . Smoking status: Never Smoker   . Smokeless tobacco: Not on file  .  Alcohol Use: No  . Drug Use: No  . Sexual Activity: Not on file   Other Topics Concern  . Not on file   Social History Narrative  . No narrative on file     Allergies  Allergen Reactions  . Aspirin     REACTION: affects asthma         Objective:   Physical Exam  Gen: Pleasant, overwt, in no distress,  normal affect/ harsh barking cough  ENT: No lesions,  mouth clear,  oropharynx clear, some mild nasal congestion, hoarse voice  Neck: No JVD, no TMG, no carotid bruits, no stridor  Lungs: No use of accessory muscles,  clear without rales or rhonchi, no wheeze  Cardiovascular: RRR, heart sounds normal, no murmur or gallops, no peripheral edema  Musculoskeletal: No deformities, no cyanosis or clubbing  Neuro: alert, non focal  Skin: Warm, no lesions or rashes     Assessment & Plan:

## 2014-07-20 NOTE — Telephone Encounter (Signed)
Called and spoke to [facility] at [facility]. [facility] awaiting to [facility] medications. Pt stated she never received the meds scripted from today's visit with MW. Ruthe Mannan was printed and pt stated she does not have the script. Meds were pred taper, protonix, zantac and dulera, verbal order given for all. Med list updated. Nothing further needed. Pt aware.

## 2014-07-21 ENCOUNTER — Encounter: Payer: Self-pay | Admitting: Internal Medicine

## 2014-07-21 LAB — ALLERGY FULL PROFILE
Alternaria Alternata: <0.1 kU/L
Bermuda Grass: <0.1 kU/L
Candida Albicans: <0.1 kU/L
Cat Dander: <0.1 kU/L
Curvularia lunata: <0.1 kU/L
Elm IgE: <0.1 kU/L
Fescue: 0.13 kU/L — ABNORMAL HIGH
G005 RYE, PERENNIAL: 0.12 kU/L — AB
G009 Red Top: 0.12 kU/L — ABNORMAL HIGH
Lamb's Quarters: <0.1 kU/L
Timothy Grass: 0.11 kU/L — ABNORMAL HIGH

## 2014-07-21 NOTE — Assessment & Plan Note (Addendum)
-   allergy profile 07/20/2014 :   Eos 7%,  IgE < 1.5  The proper method of use, as well as anticipated side effects, of a metered-dose inhaler are discussed and demonstrated to the patient. Improved effectiveness after extensive coaching during this visit to a level of approximately  75% from a baseline of < 25 %  ? Could she have eos bronchitis vs cough variant asthma that she hasn't treated effectively with topicals because her hfa is so poor at baseline  The problem is that may ics, esp in high doses, can contribute to cough.    rec trial of low dose dulera after a short course prednisone and max rx for acid and non acid gerd  See instructions for specific recommendations which were reviewed directly with the patient who was given a copy with highlighter outlining the key components.

## 2014-07-21 NOTE — Progress Notes (Signed)
Quick Note:  Spoke with pt and notified of results per Dr. Wert. Pt verbalized understanding and denied any questions.  ______ 

## 2014-08-05 ENCOUNTER — Ambulatory Visit (INDEPENDENT_AMBULATORY_CARE_PROVIDER_SITE_OTHER): Payer: 59 | Admitting: Emergency Medicine

## 2014-09-02 ENCOUNTER — Ambulatory Visit (INDEPENDENT_AMBULATORY_CARE_PROVIDER_SITE_OTHER): Payer: 59 | Admitting: Emergency Medicine

## 2014-09-02 ENCOUNTER — Encounter: Payer: Self-pay | Admitting: Emergency Medicine

## 2014-09-02 ENCOUNTER — Encounter: Payer: 59 | Admitting: Emergency Medicine

## 2014-09-02 VITALS — BP 118/78 | HR 100 | Ht 60.5 in | Wt 248.0 lb

## 2014-09-02 DIAGNOSIS — J4520 Mild intermittent asthma, uncomplicated: Secondary | ICD-10-CM

## 2014-09-02 DIAGNOSIS — J45909 Unspecified asthma, uncomplicated: Secondary | ICD-10-CM

## 2014-09-02 LAB — PULMONARY FUNCTION TEST
DL/VA % PRED: 115 %
DL/VA: 4.99 ml/min/mmHg/L
DLCO unc % pred: 115 %
DLCO unc: 22.59 ml/min/mmHg
FEF 25-75 POST: 2.55 L/s
FEF 25-75 Pre: 1.75 L/sec
FEF2575-%CHANGE-POST: 45 %
FEF2575-%PRED-POST: 85 %
FEF2575-%PRED-PRE: 58 %
FEV1-%Change-Post: 10 %
FEV1-%PRED-PRE: 83 %
FEV1-%Pred-Post: 92 %
FEV1-POST: 2.52 L
FEV1-PRE: 2.28 L
FEV1FVC-%CHANGE-POST: 6 %
FEV1FVC-%PRED-PRE: 90 %
FEV6-%CHANGE-POST: 4 %
FEV6-%PRED-POST: 97 %
FEV6-%Pred-Pre: 93 %
FEV6-Post: 3.16 L
FEV6-Pre: 3.04 L
FEV6FVC-%Pred-Post: 102 %
FEV6FVC-%Pred-Pre: 102 %
FVC-%Change-Post: 4 %
FVC-%PRED-POST: 95 %
FVC-%Pred-Pre: 91 %
FVC-POST: 3.16 L
FVC-Pre: 3.04 L
POST FEV1/FVC RATIO: 80 %
Post FEV6/FVC ratio: 100 %
Pre FEV1/FVC ratio: 75 %
Pre FEV6/FVC Ratio: 100 %
RV % PRED: 124 %
RV: 1.71 L
TLC % pred: 108 %
TLC: 4.9 L

## 2014-09-02 MED ORDER — AEROCHAMBER MV MISC: Status: DC

## 2014-09-02 NOTE — Progress Notes (Signed)
PFT done today. 

## 2014-09-02 NOTE — Patient Instructions (Signed)
Please continue your Ruthe Mannan twice a day with a spacer Use albuterol 2 puffs if needed for shortness of breath Continue your other medications as you have been taking them Follow with Dr Lamonte Sakai in 6 months or sooner if you have any problems

## 2014-09-02 NOTE — Assessment & Plan Note (Signed)
She is improved since the pred taper and the dulera. Will continue these. Continue the GERD and allergy regimens. Will give her a spacer. ROV 6

## 2014-09-02 NOTE — Progress Notes (Signed)
   Subjective:    Patient ID: Katrina Roman, female    DOB: 04/12/1976, 38 y.o.   MRN: [id number]  HPI 38 yo never smoker, hx allergies, GERD, asthma with peripheral eosinophilia, dx at age 24. Referred by Dr Deatra Ina for asthma. She has a hx of ASA sensitivity, also multiple sinus surgeries.  She has been less well controlled since Dec '14 when she may have had URI, has not been able to get back to baseline. At her baseline barely ever needs albuterol. Currently using it 2-3x a day.   ROV 06/22/14 -- follows up for asthma. Has had poor control, suspect UA irritation superimposed on her asthma. We increased her PPI, added nasal washes and steroid. She reports that she has been given pred a few times for persistent throat noise and mucous. She is on nasal steroid, NSW, PPI qd. Not on immunotherapy.   ROV 09/02/14 -- follow up for asthma, chronic cough in setting GERD and allergic rhinitis. She was seen by Dr Melvyn Novas a month ago with a dry barking cough. She was changed to [facility] and taught proper technique. Also a pred taper. Ruthe Mannan seems to have helped her.  PFT today > mild AFL with borderline BD response.   Review of Systems  Constitutional: Negative for fever and unexpected weight change.  HENT: Negative for congestion, dental problem, ear pain, nosebleeds, postnasal drip, rhinorrhea, sinus pressure, sneezing, sore throat and trouble swallowing.   Eyes: Negative for redness and itching.  Respiratory: Positive for cough, shortness of breath and wheezing. Negative for chest tightness.   Cardiovascular: Negative for palpitations and leg swelling.  Gastrointestinal: Negative for nausea and vomiting.  Genitourinary: Negative for dysuria.  Musculoskeletal: Negative for joint swelling.  Skin: Negative for rash.  Neurological: Negative for headaches.  Hematological: Does not bruise/bleed easily.  Psychiatric/Behavioral: Negative for dysphoric mood. The patient is not nervous/anxious.        Objective:   Physical Exam Filed Vitals:   09/02/14 1449  BP: 118/78  Pulse: 100  Height: 5' 0.5" (1.537 m)  Weight: 248 lb (112.492 kg)  SpO2: 96%   Gen: Pleasant, overwt, in no distress, normal affect  ENT: No lesions, mouth clear, oropharynx clear, some mild nasal congestion, hoarse voice  Neck: No JVD, no TMG, no carotid bruits, no stridor  Lungs: No use of accessory muscles, clear without rales or rhonchi, no wheeze  Cardiovascular: RRR, heart sounds normal, no murmur or gallops, no peripheral edema  Musculoskeletal: No deformities, no cyanosis or clubbing  Neuro: alert, non focal  Skin: Warm, no lesions or rashes      Assessment & Plan:  Intrinsic asthma She is improved since the pred taper and the dulera. Will continue these. Continue the GERD and allergy regimens. Will give her a spacer. ROV 6

## 2014-09-03 ENCOUNTER — Other Ambulatory Visit: Payer: Self-pay | Admitting: Internal Medicine

## 2014-10-04 ENCOUNTER — Other Ambulatory Visit: Payer: Self-pay | Admitting: Internal Medicine

## 2015-02-17 ENCOUNTER — Telehealth: Payer: Self-pay | Admitting: *Deleted

## 2015-02-17 NOTE — Telephone Encounter (Signed)
Called for PA singulair. This was approved for lifetime through plan. Called [facility] and made aware. Nothing further needed

## 2015-04-11 ENCOUNTER — Other Ambulatory Visit: Payer: Self-pay | Admitting: Emergency Medicine

## 2015-05-10 ENCOUNTER — Other Ambulatory Visit: Payer: Self-pay | Admitting: Emergency Medicine

## 2015-06-05 ENCOUNTER — Other Ambulatory Visit: Payer: Self-pay | Admitting: Emergency Medicine

## 2015-06-08 ENCOUNTER — Other Ambulatory Visit: Payer: Self-pay | Admitting: Internal Medicine

## 2015-06-08 ENCOUNTER — Other Ambulatory Visit: Payer: Self-pay | Admitting: Emergency Medicine

## 2015-08-24 ENCOUNTER — Encounter (HOSPITAL_COMMUNITY): Payer: Self-pay | Admitting: Emergency Medicine

## 2015-08-24 ENCOUNTER — Emergency Department (HOSPITAL_COMMUNITY)
Admission: EM | Admit: 2015-08-24 | Discharge: 2015-08-24 | Disposition: A | Discharge status: Home / Self Care | Payer: BLUE CROSS/BLUE SHIELD | Attending: Emergency Medicine | Admitting: Emergency Medicine

## 2015-08-24 DIAGNOSIS — Z79899 Other long term (current) drug therapy: Secondary | ICD-10-CM | POA: Insufficient documentation

## 2015-08-24 DIAGNOSIS — J45909 Unspecified asthma, uncomplicated: Secondary | ICD-10-CM | POA: Diagnosis not present

## 2015-08-24 DIAGNOSIS — R42 Dizziness and giddiness: Secondary | ICD-10-CM | POA: Diagnosis present

## 2015-08-24 DIAGNOSIS — K219 Gastro-esophageal reflux disease without esophagitis: Secondary | ICD-10-CM | POA: Insufficient documentation

## 2015-08-24 DIAGNOSIS — Z862 Personal history of diseases of the blood and blood-forming organs and certain disorders involving the immune mechanism: Secondary | ICD-10-CM | POA: Diagnosis not present

## 2015-08-24 DIAGNOSIS — J0100 Acute maxillary sinusitis, unspecified: Secondary | ICD-10-CM

## 2015-08-24 DIAGNOSIS — J40 Bronchitis, not specified as acute or chronic: Secondary | ICD-10-CM

## 2015-08-24 MED ORDER — AZITHROMYCIN 250 MG PO TABS: ORAL_TABLET | ORAL | Status: DC

## 2015-08-24 MED ORDER — PREDNISONE 50 MG PO TABS: 50.0000 mg | ORAL_TABLET | Freq: Every day | ORAL | Status: DC

## 2015-08-24 NOTE — ED Notes (Signed)
Pt states lightheadedness began about a month ago, worsening since then. Onset of cold symptoms on Monday brought worsening dizziness. Pt c/o lightheadedness when coughing.

## 2015-08-24 NOTE — ED Notes (Signed)
MD at bedside. 

## 2015-08-24 NOTE — Discharge Instructions (Signed)
Increase fluids.  Prescription for antibiotic and prednisone. Tylenol for fever.

## 2015-08-24 NOTE — ED Notes (Signed)
Pt has hx of sinus infections, feels congested with clear drainage. Also complaining of dizziness starting yesterday, especially upon standing. No LOC reported.

## 2015-08-24 NOTE — ED Provider Notes (Signed)
CSN: [id number]     Arrival date & time 08/24/15  0702 History  This chart was scribed for Nat Christen, MD by Irene Pap, ED Scribe. This patient was seen in room APA04/APA04 and patient care was started at 8:43 AM.      Chief Complaint  Patient presents with  . Dizziness   The history is provided by the patient. No language interpreter was used.   HPI Comments: Katrina Roman is a 39 y.o. Female with hx of seasonal allergies, sinus infections and asthma who presents to the Emergency Department complaining of lightheadedness, cough, fever, fullness in her ears, throat scratchiness for 1 week. She has tried over-the-counter products with minimal relief. Severity is mild to moderate.  Past Medical History  Diagnosis Date  . Seasonal allergies   . Asthma   . Peripheral eosinophilia   . GERD (gastroesophageal reflux disease)   . PUD (peptic ulcer disease)    Past Surgical History  Procedure Laterality Date  . Esophagogastroduodenoscopy  08/2010    noraml esophagus,3 1-mm superficial gastric ulcers seen in the antrum, No evidence H pylori  . Nasal sinus surgery  1997 and 2009    x 2  . Foot surgery      Right; three screws,chronic frature  . Tonsillectomy     Family History  Problem Relation Age of Onset  . Skin cancer Maternal Grandfather   . Hypertension Father   . Hypertension Mother   . Diabetes Father    Social History  Substance Use Topics  . Smoking status: Never Smoker   . Smokeless tobacco: None  . Alcohol Use: No   OB History    No data available     Review of Systems  HENT: Positive for congestion and postnasal drip.   Neurological: Negative for syncope.  All other systems reviewed and are negative.  Allergies  Aspirin  Home Medications   Prior to Admission medications   Medication Sig Start Date End Date Taking? Authorizing Provider  albuterol (VENTOLIN HFA) 108 (90 BASE) MCG/ACT inhaler Inhale 2 puffs into the lungs every 6 (six) hours as needed.       Historical Provider, MD  azithromycin (ZITHROMAX) 250 MG tablet 2 tablets day 1, 1 tablet day 2 through 5 08/24/15   Nat Christen, MD  loratadine (CLARITIN) 10 MG tablet Take 10 mg by mouth daily.      Historical Provider, MD  mometasone-formoterol (DULERA) 100-5 MCG/ACT AERO Take 2 puffs first thing in am and then another 2 puffs about 12 hours later. 07/20/14   Tanda Rockers, MD  montelukast (SINGULAIR) 10 MG tablet TAKE 1 TABLET (10 MG TOTAL) BY MOUTH AT BEDTIME. 06/09/15   Collene Gobble, MD  pantoprazole (PROTONIX) 40 MG tablet TAKE 1 TABLET BY MOUTH 30 TO 60 MINUTES BEFORE 1ST MEAL OF THE DAY 06/09/15   Collene Gobble, MD  predniSONE (DELTASONE) 50 MG tablet Take 1 tablet (50 mg total) by mouth daily. 08/24/15   Nat Christen, MD  ranitidine (ZANTAC) 150 MG capsule Take 2 capsules (300 mg total) by mouth every evening. 07/20/14   Tanda Rockers, MD  Spacer/Aero-Holding Chambers (AEROCHAMBER MV) inhaler Use as instructed 09/02/14   Collene Gobble, MD   BP 128/84 mmHg  Pulse 98  Temp(Src) 98.3 F (36.8 C) (Oral)  Resp 16  Ht 5' 1"  (1.549 m)  Wt 220 lb (99.791 kg)  BMI 41.59 kg/m2  SpO2 95%   Physical Exam  Constitutional: She is  oriented to person, place, and time. She appears well-developed and well-nourished.  HENT:  Head: Normocephalic and atraumatic.  Minimal maxillary sinus tenderness  Eyes: Conjunctivae and EOM are normal. Pupils are equal, round, and reactive to light.  Neck: Normal range of motion. Neck supple.  Cardiovascular: Normal rate and regular rhythm.   Pulmonary/Chest: Effort normal and breath sounds normal.  Abdominal: Soft. Bowel sounds are normal.  Musculoskeletal: Normal range of motion.  Neurological: She is alert and oriented to person, place, and time.  Skin: Skin is warm and dry.  Psychiatric: She has a normal mood and affect. Her behavior is normal.  Nursing note and vitals reviewed.   ED Course  Procedures (including critical care time) DIAGNOSTIC  STUDIES: Oxygen Saturation is 95% on RA, normal by my interpretation.    COORDINATION OF CARE: 8:43 AM-Discussed treatment plan with patient  Labs Review Labs Reviewed - No data to display  Imaging Review No results found.    EKG Interpretation None      MDM   Final diagnoses:  Acute maxillary sinusitis, recurrence not specified  Bronchitis  Patient is in no acute distress. Symptoms have been persistent for 1 week. Rx Zithromax and prednisone   I personally performed the services described in this documentation, which was scribed in my presence. The recorded information has been reviewed and is accurate.    Nat Christen, MD 08/24/15 [phone]

## 2015-10-04 ENCOUNTER — Encounter: Payer: Self-pay | Admitting: Emergency Medicine

## 2015-10-04 ENCOUNTER — Encounter: Payer: Self-pay | Admitting: *Deleted

## 2015-10-04 ENCOUNTER — Ambulatory Visit (INDEPENDENT_AMBULATORY_CARE_PROVIDER_SITE_OTHER): Payer: BLUE CROSS/BLUE SHIELD | Admitting: Emergency Medicine

## 2015-10-04 ENCOUNTER — Ambulatory Visit: Payer: BLUE CROSS/BLUE SHIELD | Admitting: Emergency Medicine

## 2015-10-04 VITALS — BP 110/70 | HR 102 | Ht 61.0 in | Wt 229.0 lb

## 2015-10-04 DIAGNOSIS — K219 Gastro-esophageal reflux disease without esophagitis: Secondary | ICD-10-CM | POA: Diagnosis not present

## 2015-10-04 DIAGNOSIS — J4520 Mild intermittent asthma, uncomplicated: Secondary | ICD-10-CM | POA: Diagnosis not present

## 2015-10-04 DIAGNOSIS — J309 Allergic rhinitis, unspecified: Secondary | ICD-10-CM | POA: Diagnosis not present

## 2015-10-04 MED ORDER — FLUTICASONE PROPIONATE 50 MCG/ACT NA SUSP: 2.0000 | Freq: Every day | NASAL | Status: DC

## 2015-10-04 MED ORDER — PREDNISONE 10 MG PO TABS: ORAL_TABLET | ORAL | Status: DC

## 2015-10-04 NOTE — Assessment & Plan Note (Signed)
Continue nasal saline washes, Singulair, loratadine. We will add a nasal steroid.

## 2015-10-04 NOTE — Patient Instructions (Addendum)
Please continue your Dulera, singulair, loratadine, nasal saline as you have been using them  Please take prednisone as directed to completion Continue your pantoprazole and ranitidine Follow with Dr Lamonte Sakai in 2 months or sooner if you have any problems.

## 2015-10-04 NOTE — Progress Notes (Signed)
Subjective:    Patient ID: Katrina Roman, female    DOB: 07-30-1976, 39 y.o.   MRN: [id number]  Asthma She complains of cough, shortness of breath and wheezing. Pertinent negatives include no ear pain, fever, headaches, postnasal drip, rhinorrhea, sneezing, sore throat or trouble swallowing. Her past medical history is significant for asthma.   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 Referred by Dr Deatra Ina for asthma. She has a hx of ASA sensitivity, also multiple sinus surgeries.  She has been less well controlled since Dec '14 when she may have had URI, has not been able to get back to baseline. At her baseline barely ever needs albuterol. Currently using it 2-3x a day.   ROV 06/22/14 -- follows up for asthma. Has had poor control, suspect UA irritation superimposed on her asthma. We increased her PPI, added nasal washes and steroid. She reports that she has been given pred a few times for persistent throat noise and mucous. She is on nasal steroid, [place], PPI qd. Not on immunotherapy.   ROV 09/02/14 -- follow up for asthma, chronic cough in setting GERD and allergic rhinitis. She was seen by Dr Melvyn Novas a month ago with a dry barking cough. She was changed to [facility] and taught proper technique. Also a pred taper. Ruthe Mannan seems to have helped her.  PFT today > mild AFL with borderline BD response.   ROV 10/04/15 -- follow-up visit for chronic cough and asthma both exacerbated by GERD and allergic rhinitis. She has been doing quite well, but did have an AE mid-September as allergy sx began > was treated with pred + azithro.  She has a new house for the last 7 months.  Uses albuterol about twice a day.    Review of Systems  Constitutional: Negative for fever and unexpected weight change.  HENT: Negative for congestion, dental problem, ear pain, nosebleeds, postnasal drip, rhinorrhea, sinus pressure, sneezing, sore throat and trouble swallowing.   Eyes:  Negative for redness and itching.  Respiratory: Positive for cough, shortness of breath and wheezing. Negative for chest tightness.   Cardiovascular: Negative for palpitations and leg swelling.  Gastrointestinal: Negative for nausea and vomiting.  Genitourinary: Negative for dysuria.  Musculoskeletal: Negative for joint swelling.  Skin: Negative for rash.  Neurological: Negative for headaches.  Hematological: Does not bruise/bleed easily.  Psychiatric/Behavioral: Negative for dysphoric mood. The patient is not nervous/anxious.        Objective:   Physical Exam Filed Vitals:   10/04/15 1458  BP: 110/70  Pulse: 102  Height: 5' 1"  (1.549 m)  Weight: 229 lb (103.874 kg)  SpO2: 98%   Gen: Pleasant, overwt, in no distress, normal affect  ENT: No lesions, mouth clear, oropharynx clear, some mild nasal congestion, hoarse voice  Neck: No JVD, no TMG, no carotid bruits, no stridor  Lungs: No use of accessory muscles, clear without rales or rhonchi, no wheeze  Cardiovascular: RRR, heart sounds normal, no murmur or gallops, no peripheral edema  Musculoskeletal: No deformities, no cyanosis or clubbing  Neuro: alert, non focal  Skin: Warm, no lesions or rashes      Assessment & Plan:  Intrinsic asthma Has been doing fairly well but more difficult to manage during the fall months. She has had an apparent acute exacerbation, is now starting to have some throat irritation and stridor. I feel that she should be treated systemically while we ramp up her allergy regimen. Will treat  her with prednisone, continue her bronchodilators and follow her in 2 months  GERD Continue pantoprazole and ranitidine  Allergic rhinitis Continue nasal saline washes, Singulair, loratadine. We will add a nasal steroid.

## 2015-10-04 NOTE — Assessment & Plan Note (Signed)
Continue pantoprazole and ranitidine

## 2015-10-04 NOTE — Assessment & Plan Note (Signed)
Has been doing fairly well but more difficult to manage during the fall months. She has had an apparent acute exacerbation, is now starting to have some throat irritation and stridor. I feel that she should be treated systemically while we ramp up her allergy regimen. Will treat her with prednisone, continue her bronchodilators and follow her in 2 months

## 2015-10-05 ENCOUNTER — Telehealth: Payer: Self-pay | Admitting: Emergency Medicine

## 2015-10-05 DIAGNOSIS — J45909 Unspecified asthma, uncomplicated: Secondary | ICD-10-CM

## 2015-10-05 MED ORDER — MOMETASONE FURO-FORMOTEROL FUM 100-5 MCG/ACT IN AERO: INHALATION_SPRAY | RESPIRATORY_TRACT | Status: DC

## 2015-10-05 NOTE — Telephone Encounter (Signed)
Spoke with pt. She would prefer this to be sent to [facility]. Rx has been sent in. Nothing further was needed.

## 2015-10-17 ENCOUNTER — Telehealth: Payer: Self-pay | Admitting: Emergency Medicine

## 2015-10-17 NOTE — Telephone Encounter (Signed)
Dulera costs patient over $700 every 3 months, would like another medication to replace the [facility] that is affordable through her insurance.  She said that her insurance company did not provide her with a formulary and did not tell her any alternatives that they would pay for.    Called [facility] and they advised me that patient has not met her deductible and that is why the Ruthe Mannan is so expensive. They checked Symbicort and Breo to see if these would be less, but unfortunately, these are more expensive than the [facility].    Dr. Lamonte Sakai, please advise.

## 2015-10-20 NOTE — Telephone Encounter (Signed)
Discussed deduction with patient.  She understands that issue.  She said that she will find out how much the Advair would be and let us know if we need to send in a Rx for it. r

## 2015-10-20 NOTE — Telephone Encounter (Signed)
Need to explain the deductible problem to her  The 4 comparable meds are Dulera, Symbicort, Breo and Advair. She can check on price of Advair.  Would it be easier to handle cost of 1 per month from local drug store, instead of having to pay for 3 months all at once?

## 2015-10-20 NOTE — Telephone Encounter (Signed)
[phone], Katrina Roman

## 2015-10-20 NOTE — Telephone Encounter (Signed)
[id number] x1 

## 2015-10-20 NOTE — Telephone Encounter (Signed)
[phone], pt cb to f/u

## 2015-10-20 NOTE — Telephone Encounter (Signed)
Patient calling to check on status of medication.  Dr. Lamonte Sakai will be out of the office until 11/14.  Re-routing to DOD, Dr. Annamaria Boots, please advise.

## 2015-10-28 ENCOUNTER — Emergency Department (HOSPITAL_COMMUNITY): Payer: BLUE CROSS/BLUE SHIELD

## 2015-10-28 ENCOUNTER — Encounter (HOSPITAL_COMMUNITY): Payer: Self-pay | Admitting: Cardiology

## 2015-10-28 ENCOUNTER — Emergency Department (HOSPITAL_COMMUNITY)
Admission: EM | Admit: 2015-10-28 | Discharge: 2015-10-28 | Disposition: A | Discharge status: Home / Self Care | Payer: BLUE CROSS/BLUE SHIELD | Attending: Emergency Medicine | Admitting: Emergency Medicine

## 2015-10-28 DIAGNOSIS — Z862 Personal history of diseases of the blood and blood-forming organs and certain disorders involving the immune mechanism: Secondary | ICD-10-CM | POA: Insufficient documentation

## 2015-10-28 DIAGNOSIS — Z7951 Long term (current) use of inhaled steroids: Secondary | ICD-10-CM | POA: Diagnosis not present

## 2015-10-28 DIAGNOSIS — J45901 Unspecified asthma with (acute) exacerbation: Secondary | ICD-10-CM | POA: Insufficient documentation

## 2015-10-28 DIAGNOSIS — J4541 Moderate persistent asthma with (acute) exacerbation: Secondary | ICD-10-CM

## 2015-10-28 DIAGNOSIS — Z8711 Personal history of peptic ulcer disease: Secondary | ICD-10-CM | POA: Diagnosis not present

## 2015-10-28 DIAGNOSIS — Z79899 Other long term (current) drug therapy: Secondary | ICD-10-CM | POA: Diagnosis not present

## 2015-10-28 DIAGNOSIS — K219 Gastro-esophageal reflux disease without esophagitis: Secondary | ICD-10-CM | POA: Diagnosis not present

## 2015-10-28 DIAGNOSIS — R0602 Shortness of breath: Secondary | ICD-10-CM | POA: Diagnosis present

## 2015-10-28 MED ORDER — IPRATROPIUM-ALBUTEROL 0.5-2.5 (3) MG/3ML IN SOLN
3.0000 mL | Freq: Once | RESPIRATORY_TRACT | Status: AC
Start: 2015-10-28 — End: 2015-10-28
  Administered 2015-10-28: 3 mL via RESPIRATORY_TRACT
  Filled 2015-10-28: qty 3

## 2015-10-28 MED ORDER — ALBUTEROL (5 MG/ML) CONTINUOUS INHALATION SOLN
15.0000 mg | INHALATION_SOLUTION | Freq: Once | RESPIRATORY_TRACT | Status: AC
  Administered 2015-10-28: 15 mg via RESPIRATORY_TRACT
  Filled 2015-10-28: qty 20

## 2015-10-28 MED ORDER — PREDNISONE 50 MG PO TABS
60.0000 mg | ORAL_TABLET | Freq: Once | ORAL | Status: AC
  Administered 2015-10-28: 60 mg via ORAL
  Filled 2015-10-28 (×2): qty 1

## 2015-10-28 MED ORDER — PREDNISONE 10 MG PO TABS: ORAL_TABLET | ORAL | Status: DC

## 2015-10-28 NOTE — ED Notes (Signed)
Sob and wheezing since yesterday.

## 2015-10-28 NOTE — ED Provider Notes (Signed)
CSN: [id number]     Arrival date & time 10/28/15  2979 History   First MD Initiated Contact with Patient 10/28/15 [phone]     Chief Complaint  Patient presents with  . Shortness of Breath     (Consider location/radiation/quality/duration/timing/severity/associated sxs/prior Treatment) HPI   Katrina QUIZHPI is a 39 y.o. female procedure for evaluation of shortness of breath, present for 1 week, and worsening over the last 12 hours. Katrina Roman is using home treatment with rescue inhaler and nebulizer, without relief. Katrina Roman was treated with prednisone about 3 weeks ago, as well as 2 months ago by different providers. Katrina Roman denies fever, chills, productive cough, weakness or dizziness. Katrina Roman is using her usual medications, without relief. There are no other known modifying factors  Past Medical History  Diagnosis Date  . Seasonal allergies   . Asthma   . Peripheral eosinophilia   . GERD (gastroesophageal reflux disease)   . PUD (peptic ulcer disease)    Past Surgical History  Procedure Laterality Date  . Esophagogastroduodenoscopy  08/2010    noraml esophagus,3 1-mm superficial gastric ulcers seen in the antrum, No evidence H pylori  . Nasal sinus surgery  1997 and 2009    x 2  . Foot surgery      Right; three screws,chronic frature  . Tonsillectomy     Family History  Problem Relation Age of Onset  . Skin cancer Maternal Grandfather   . Hypertension Father   . Hypertension Mother   . Diabetes Father    Social History  Substance Use Topics  . Smoking status: Never Smoker   . Smokeless tobacco: None  . Alcohol Use: No   OB History    No data available     Review of Systems  All other systems reviewed and are negative.     Allergies  Aspirin  Home Medications   Prior to Admission medications   Medication Sig Start Date End Date Taking? Authorizing Provider  albuterol (VENTOLIN HFA) 108 (90 BASE) MCG/ACT inhaler Inhale 2 puffs into the lungs every 6 (six) hours as needed.     Yes  Historical Provider, MD  fluticasone (FLONASE) 50 MCG/ACT nasal spray Place 2 sprays into both nostrils daily. 10/04/15  Yes Collene Gobble, MD  loratadine (CLARITIN) 10 MG tablet Take 10 mg by mouth daily.     Yes Historical Provider, MD  mometasone-formoterol (DULERA) 100-5 MCG/ACT AERO Take 2 puffs first thing in am and then another 2 puffs about 12 hours later. 10/05/15  Yes Collene Gobble, MD  montelukast (SINGULAIR) 10 MG tablet TAKE 1 TABLET (10 MG TOTAL) BY MOUTH AT BEDTIME. 06/09/15  Yes Collene Gobble, MD  pantoprazole (PROTONIX) 40 MG tablet TAKE 1 TABLET BY MOUTH 30 TO 60 MINUTES BEFORE 1ST MEAL OF THE DAY 06/09/15  Yes Collene Gobble, MD  ranitidine (ZANTAC) 150 MG capsule Take 2 capsules (300 mg total) by mouth every evening. 07/20/14  Yes Tanda Rockers, MD  Spacer/Aero-Holding Chambers (AEROCHAMBER MV) inhaler Use as instructed 09/02/14  Yes Collene Gobble, MD  predniSONE (DELTASONE) 10 MG tablet Take 4 tablets for 3 days, 3 tablets for 3 days, 2 tablets for 3 days, 1 tablet for 3 days 10/28/15   Daleen Bo, MD   BP 107/55 mmHg  Pulse 111  Temp(Src) 98 F (36.7 C) (Oral)  Resp 18  Ht 5' 1"  (1.549 m)  Wt 229 lb (103.874 kg)  BMI 43.29 kg/m2  SpO2 94% Physical Exam  Constitutional: Katrina Roman is oriented to person, place, and time. Katrina Roman appears well-developed and well-nourished. Katrina Roman appears distressed (Katrina Roman is uncomfortable).  HENT:  Head: Normocephalic and atraumatic.  Right Ear: External ear normal.  Left Ear: External ear normal.  Eyes: Conjunctivae and EOM are normal. Pupils are equal, round, and reactive to light.  Neck: Normal range of motion and phonation normal. Neck supple.  Cardiovascular: Normal rate, regular rhythm and normal heart sounds.   Pulmonary/Chest: Breath sounds normal. Katrina Roman is in respiratory distress (Mild increased work of breathing). Katrina Roman exhibits no bony tenderness.  Abdominal: Soft. There is no tenderness.  Musculoskeletal: Normal range of motion.   Neurological: Katrina Roman is alert and oriented to person, place, and time. No cranial nerve deficit or sensory deficit. Katrina Roman exhibits normal muscle tone. Coordination normal.  Skin: Skin is warm, dry and intact.  Psychiatric: Katrina Roman has a normal mood and affect. Her behavior is normal. Judgment and thought content normal.  Nursing note and vitals reviewed.   ED Course  Procedures (including critical care time)  Medications  ipratropium-albuterol (DUONEB) 0.5-2.5 (3) MG/3ML nebulizer solution 3 mL (3 mLs Nebulization Given 10/28/15 0808)  albuterol (PROVENTIL,VENTOLIN) solution continuous neb (15 mg Nebulization Given 10/28/15 0951)  predniSONE (DELTASONE) tablet 60 mg (60 mg Oral Given 10/28/15 0932)    Patient Vitals for the past 24 hrs:  BP Temp Temp src Pulse Resp SpO2 Height Weight  10/28/15 1230 107/55 mmHg - - 111 18 94 % - -  10/28/15 1200 126/69 mmHg - - 106 18 94 % - -  10/28/15 1130 116/61 mmHg - - 98 20 (!) 89 % - -  10/28/15 1102 - - - 113 18 95 % - -  10/28/15 1100 131/66 mmHg - - 114 19 95 % - -  10/28/15 1030 128/66 mmHg - - 108 17 95 % - -  10/28/15 1000 122/72 mmHg - - 98 16 96 % - -  10/28/15 0950 - - - - - 95 % - -  10/28/15 0930 112/70 mmHg - - 92 17 96 % - -  10/28/15 0907 - - - 94 23 96 % - -  10/28/15 0830 117/82 mmHg - - 98 17 95 % - -  10/28/15 0808 - - - - - 100 % - -  10/28/15 0800 129/85 mmHg - - 97 14 97 % - -  10/28/15 0733 129/86 mmHg 98 F (36.7 C) Oral 102 24 95 % 5' 1"  (1.549 m) 229 lb (103.874 kg)    At D/C Reevaluation with update and discussion. After initial assessment and treatment, an updated evaluation reveals Katrina Roman feels better, is moving air better, but has increasing wheezing. Her work of breathing has decreased. Findings discussed with the patient, all questions were answered.Daleen Bo L   CRITICAL CARE Performed by: Richarda Blade Total critical care time: 35 minutes minutes Critical care time was exclusive of separately billable  procedures and treating other patients. Critical care was necessary to treat or prevent imminent or life-threatening deterioration. Critical care was time spent personally by me on the following activities: development of treatment plan with patient and/or surrogate as well as nursing, discussions with consultants, evaluation of patient's response to treatment, examination of patient, obtaining history from patient or surrogate, ordering and performing treatments and interventions, ordering and review of laboratory studies, ordering and review of radiographic studies, pulse oximetry and re-evaluation of patient's condition.  Labs Review Labs Reviewed - No data to display  Imaging Review Dg Chest 2 View  10/28/2015  CLINICAL DATA:  Shortness of breath and wheezing since yesterday. History of asthma. EXAM: CHEST  2 VIEW COMPARISON:  12/10/2013 FINDINGS: The heart size and mediastinal contours are within normal limits. Both lungs are clear. No pleural effusion or pneumothorax. The visualized skeletal structures are unremarkable. IMPRESSION: No active cardiopulmonary disease. Electronically Signed   By: Lajean Manes M.D.   On: 10/28/2015 09:17   I have personally reviewed and evaluated these images and lab results as part of my medical decision-making.   EKG Interpretation   Date/Time:  Saturday October 28 2015 07:32:56 EST Ventricular Rate:  105 PR Interval:  138 QRS Duration: 83 QT Interval:  340 QTC Calculation: 449 R Axis:   -5 Text Interpretation:  Sinus tachycardia No old tracing to compare  Confirmed by [facility]  MD, Alycea Segoviano [phone]) on 10/28/2015 7:46:23 AM      MDM   Final diagnoses:  Asthma, moderate persistent, with acute exacerbation    Recurrent frequent asthma exacerbations, 3 over 1 month. Katrina Roman has had to use prednisone each of these exacerbations. Doubt pneumonia, metabolic instability or impending vascular collapse. Katrina Roman is improved and stable for discharge with home  treatment. Katrina Roman will need close follow-up with her pulmonologist. Inciting source may be intrarenal allergies. Doubt bacterial infection today.  Nursing Notes Reviewed/ Care Coordinated Applicable Imaging Reviewed Interpretation of Laboratory Data incorporated into ED treatment  The patient appears reasonably screened and/or stabilized for discharge and I doubt any other medical condition or other [facility] requiring further screening, evaluation, or treatment in the ED at this time prior to discharge.  Plan: Home Medications- Prednisones; Home Treatments- rest; return here if the recommended treatment, does not improve the symptoms; Recommended follow up- Pulm. ASAP     Daleen Bo, MD 10/28/15 1321

## 2015-10-28 NOTE — ED Notes (Signed)
Pt awake.  No respiratory distress.  States she feels better. sats 95%,  Auscultate some bilateral expiratory and inspiratory wheezes.

## 2015-10-28 NOTE — Discharge Instructions (Signed)
Asthma, Adult Asthma is a recurring condition in which the airways tighten and narrow. Asthma can make it difficult to breathe. It can cause coughing, wheezing, and shortness of breath. Asthma episodes, also called asthma attacks, range from minor to life-threatening. Asthma cannot be cured, but medicines and lifestyle changes can help control it. CAUSES Asthma is believed to be caused by inherited (genetic) and environmental factors, but its exact cause is unknown. Asthma may be triggered by allergens, lung infections, or irritants in the air. Asthma triggers are different for each person. Common triggers include:   Animal dander.  Dust mites.  Cockroaches.  Pollen from trees or grass.  Mold.  Smoke.  Air pollutants such as dust, household cleaners, hair sprays, aerosol sprays, paint fumes, strong chemicals, or strong odors.  Cold air, weather changes, and winds (which increase molds and pollens in the air).  Strong emotional expressions such as crying or laughing hard.  Stress.  Certain medicines (such as aspirin) or types of drugs (such as beta-blockers).  Sulfites in foods and drinks. Foods and drinks that may contain sulfites include dried fruit, potato chips, and sparkling grape juice.  Infections or inflammatory conditions such as the flu, a cold, or an inflammation of the nasal membranes (rhinitis).  Gastroesophageal reflux disease (GERD).  Exercise or strenuous activity. SYMPTOMS Symptoms may occur immediately after asthma is triggered or many hours later. Symptoms include:  Wheezing.  Excessive nighttime or early morning coughing.  Frequent or severe coughing with a common cold.  Chest tightness.  Shortness of breath. DIAGNOSIS  The diagnosis of asthma is made by a review of your medical history and a physical exam. Tests may also be performed. These may include:  Lung function studies. These tests show how much air you breathe in and out.  Allergy  tests.  Imaging tests such as X-rays. TREATMENT  Asthma cannot be cured, but it can usually be controlled. Treatment involves identifying and avoiding your asthma triggers. It also involves medicines. There are 2 classes of medicine used for asthma treatment:   Controller medicines. These prevent asthma symptoms from occurring. They are usually taken every day.  Reliever or rescue medicines. These quickly relieve asthma symptoms. They are used as needed and provide short-term relief. Your health care provider will help you create an asthma action plan. An asthma action plan is a written plan for managing and treating your asthma attacks. It includes a list of your asthma triggers and how they may be avoided. It also includes information on when medicines should be taken and when their dosage should be changed. An action plan may also involve the use of a device called a peak flow meter. A peak flow meter measures how well the lungs are working. It helps you monitor your condition. HOME CARE INSTRUCTIONS   Take medicines only as directed by your health care provider. Speak with your health care provider if you have questions about how or when to take the medicines.  Use a peak flow meter as directed by your health care provider. Record and keep track of readings.  Understand and use the action plan to help minimize or stop an asthma attack without needing to seek medical care.  Control your home environment in the following ways to help prevent asthma attacks:  Do not smoke. Avoid being exposed to secondhand smoke.  Change your heating and air conditioning filter regularly.  Limit your use of fireplaces and wood stoves.  Get rid of pests (such as roaches  and mice) and their droppings.  Throw away plants if you see mold on them.  Clean your floors and dust regularly. Use unscented cleaning products.  Try to have someone else vacuum for you regularly. Stay out of rooms while they are  being vacuumed and for a short while afterward. If you vacuum, use a dust mask from a hardware store, a double-layered or microfilter vacuum cleaner bag, or a vacuum cleaner with a HEPA filter.  Replace carpet with wood, tile, or vinyl flooring. Carpet can trap dander and dust.  Use allergy-proof pillows, mattress covers, and box spring covers.  Wash bed sheets and blankets every week in hot water and dry them in a dryer.  Use blankets that are made of polyester or cotton.  Clean bathrooms and kitchens with bleach. If possible, have someone repaint the walls in these rooms with mold-resistant paint. Keep out of the rooms that are being cleaned and painted.  Wash hands frequently. SEEK MEDICAL CARE IF:   You have wheezing, shortness of breath, or a cough even if taking medicine to prevent attacks.  The colored mucus you cough up (sputum) is thicker than usual.  Your sputum changes from clear or white to yellow, green, gray, or bloody.  You have any problems that may be related to the medicines you are taking (such as a rash, itching, swelling, or trouble breathing).  You are using a reliever medicine more than 2-3 times per week.  Your peak flow is still at 50-79% of your personal best after following your action plan for 1 hour.  You have a fever. SEEK IMMEDIATE MEDICAL CARE IF:   You seem to be getting worse and are unresponsive to treatment during an asthma attack.  You are short of breath even at rest.  You get short of breath when doing very little physical activity.  You have difficulty eating, drinking, or talking due to asthma symptoms.  You develop chest pain.  You develop a fast heartbeat.  You have a bluish color to your lips or fingernails.  You are light-headed, dizzy, or faint.  Your peak flow is less than 50% of your personal best.   This information is not intended to replace advice given to you by your health care provider. Make sure you discuss any  questions you have with your health care provider.   Document Released: 12/02/2005 Document Revised: 08/23/2015 Document Reviewed: 07/01/2013 Elsevier Interactive Patient Education [employer].

## 2015-10-28 NOTE — ED Notes (Signed)
Pt states she is not breathing any better after breathing treatment.  Sat 95%

## 2015-10-30 ENCOUNTER — Ambulatory Visit (INDEPENDENT_AMBULATORY_CARE_PROVIDER_SITE_OTHER): Payer: BLUE CROSS/BLUE SHIELD | Admitting: Adult Health

## 2015-10-30 ENCOUNTER — Encounter: Payer: Self-pay | Admitting: Adult Health

## 2015-10-30 ENCOUNTER — Telehealth: Payer: Self-pay | Admitting: Emergency Medicine

## 2015-10-30 VITALS — BP 138/88 | HR 90 | Temp 98.1°F | Ht 61.0 in | Wt 230.0 lb

## 2015-10-30 DIAGNOSIS — J309 Allergic rhinitis, unspecified: Secondary | ICD-10-CM

## 2015-10-30 DIAGNOSIS — J4521 Mild intermittent asthma with (acute) exacerbation: Secondary | ICD-10-CM

## 2015-10-30 MED ORDER — MOMETASONE FURO-FORMOTEROL FUM 100-5 MCG/ACT IN AERO: 2.0000 | INHALATION_SPRAY | Freq: Two times a day (BID) | RESPIRATORY_TRACT | Status: DC

## 2015-10-30 NOTE — Progress Notes (Signed)
Subjective:    Patient ID: Katrina Roman, female    DOB: 09-23-1976, 39 y.o.   MRN: [id number]  HPI 39 year old never smoker with history of allergic rhinitis, asthma with peripheral eosinophilia  diagnosed at age 18, ASA sensitivity .   10/30/2015 Follow up : Asthma, cough , GERD /AR  Patient presents for an emergency room follow-up. Was seen in emergency room on November 12 for an asthma exacerbation Chest x-ray showed no acute process. . She had  SOB and wheezing with exertion. Denies any chest tightness/congestion, cough, sinus pressure/drainage, fever, nausea or vomiting. Pt is currently taking pred taper.  She is feeling better since starting prednisone. Has week left of steroids.  Remains on [facility], says she is taking . Insurance has high deductible with inhalers.  Has had 3 flare in last 2 months .did well until Sept. Has had 3 steroid tapers.  No changes at work.  Moved into new house. Less dust  Has dog -26 yr old.  Taking Dulera, singulair , and claritin .  Takes zantac and protonix for GERD. No break through.    Patient denies any chest pain, orthopnea, PND, or hemoptysis.  Past Medical History  Diagnosis Date  . Seasonal allergies   . Asthma   . Peripheral eosinophilia   . GERD (gastroesophageal reflux disease)   . PUD (peptic ulcer disease)    Current Outpatient Prescriptions on File Prior to Visit  Medication Sig Dispense Refill  . albuterol (VENTOLIN HFA) 108 (90 BASE) MCG/ACT inhaler Inhale 2 puffs into the lungs every 6 (six) hours as needed.      . fluticasone (FLONASE) 50 MCG/ACT nasal spray Place 2 sprays into both nostrils daily. 16 g 0  . loratadine (CLARITIN) 10 MG tablet Take 10 mg by mouth daily.      . mometasone-formoterol (DULERA) 100-5 MCG/ACT AERO Take 2 puffs first thing in am and then another 2 puffs about 12 hours later. 3 Inhaler 3  . montelukast (SINGULAIR) 10 MG tablet TAKE 1 TABLET (10 MG TOTAL) BY MOUTH AT BEDTIME. 30 tablet 0  .  pantoprazole (PROTONIX) 40 MG tablet TAKE 1 TABLET BY MOUTH 30 TO 60 MINUTES BEFORE 1ST MEAL OF THE DAY 30 tablet 0  . predniSONE (DELTASONE) 10 MG tablet Take 4 tablets for 3 days, 3 tablets for 3 days, 2 tablets for 3 days, 1 tablet for 3 days 30 tablet 0  . ranitidine (ZANTAC) 150 MG capsule Take 2 capsules (300 mg total) by mouth every evening. 60 capsule 1  . Spacer/Aero-Holding Chambers (AEROCHAMBER MV) inhaler Use as instructed 1 each 0   No current facility-administered medications on file prior to visit.     Review of Systems .Constitutional:   No  weight loss, night sweats,  Fevers, chills, fatigue, or  lassitude.  HEENT:   No headaches,  Difficulty swallowing,  Tooth/dental problems, or  Sore throat,                No sneezing, itching, ear ache,  +nasal congestion, post nasal drip,   CV:  No chest pain,  Orthopnea, PND, swelling in lower extremities, anasarca, dizziness, palpitations, syncope.   GI  No heartburn, indigestion, abdominal pain, nausea, vomiting, diarrhea, change in bowel habits, loss of appetite, bloody stools.   Resp:    No chest wall deformity  Skin: no rash or lesions.  GU: no dysuria, change in color of urine, no urgency or frequency.  No flank pain, no hematuria  MS:  No joint pain or swelling.  No decreased range of motion.  No back pain.  Psych:  No change in mood or affect. No depression or anxiety.  No memory loss.         Objective:   Physical Exam  GEN: A/Ox3; pleasant , NAD, well nourished  VS reviewed   HEENT:  [place]/AT,  EACs-clear, TMs-wnl, NOSE-clear drainage , THROAT-clear, no lesions, no postnasal drip or exudate noted.   NECK:  Supple w/ fair ROM; no JVD; normal carotid impulses w/o bruits; no thyromegaly or nodules palpated; no lymphadenopathy.  RESP  Clear  P & A; w/o, wheezes/ rales/ or rhonchi.no accessory muscle use, no dullness to percussion  CARD:  RRR, no m/r/g  , no peripheral edema, pulses intact, no cyanosis or  clubbing.  GI:   Soft & nt; nml bowel sounds; no organomegaly or masses detected.  Musco: Warm bil, no deformities or joint swelling noted.   Neuro: alert, no focal deficits noted.    Skin: Warm, no lesions or rashes          Assessment & Plan:

## 2015-10-30 NOTE — Assessment & Plan Note (Signed)
Finish prednisone taper as directed Continue on Dulera twice daily Change l Claritin to Zyrtec at bedtime Follow-up in 2 months with Dr. Lamonte Sakai as planned and as needed Please contact office for sooner follow up if symptoms do not improve or worsen or seek emergency care

## 2015-10-30 NOTE — Assessment & Plan Note (Signed)
Recurrent flare ? Triggers  Consider repeat CBC w/ diff on return once off steroids  Change antihistamine to control for triggers cxr ok   Plan  Finish prednisone taper as directed Continue on Dulera twice daily Change l Claritin to Zyrtec at bedtime Follow-up in 2 months with Dr. Lamonte Sakai as planned and as needed Please contact office for sooner follow up if symptoms do not improve or worsen or seek emergency care

## 2015-10-30 NOTE — Telephone Encounter (Signed)
Per email from patient  I had to go to the ER Sat for my asthma and the ER doc wants me to follow up with Dr Lamonte Sakai sooner than my appointment in Jan.

## 2015-10-30 NOTE — Telephone Encounter (Signed)
Called and spoke to pt. Appt made with TP today (10/30/2015). Pt verbalized understanding and denied any further questions or concerns at this time.

## 2015-10-30 NOTE — Patient Instructions (Addendum)
Finish prednisone taper as directed Continue on Dulera twice daily Change l Claritin to Zyrtec at bedtime Follow-up in 2 months with Dr. Lamonte Sakai as planned and as needed Please contact office for sooner follow up if symptoms do not improve or worsen or seek emergency care

## 2015-10-30 NOTE — Addendum Note (Signed)
Addended by: Osa Craver on: 10/30/2015 04:43 PM   Modules accepted: Orders

## 2015-11-03 ENCOUNTER — Telehealth: Payer: Self-pay | Admitting: Adult Health

## 2015-11-03 NOTE — Telephone Encounter (Signed)
Per [place] nurse- forms have not been received. Pt will need to refax forms.  Spoke with patient, states that she has tried to fax these several times, will fax one more time and if we do not receive them she will just mail them. Advised her that if she mails them to make sure that she makes a copy in case they get lost or delayed in the mail. Will send to [facility] to keep an eye out and follow up

## 2015-11-06 NOTE — Telephone Encounter (Signed)
Looked in folder up front and in TP's to-do folder at [employer], did not see any forms on this pt.    Estill Bamberg, please advise if you've received these forms, or if pt needs to re-send them.  Thanks!

## 2015-11-06 NOTE — Telephone Encounter (Signed)
I have not received any forms. Called and spoke with the pt. Informed her that we have not received the fax. She stated that she mailed a copy to our office with the Attn: Tammy Parrett NP/Female Iafrate. I explained to her that once I received the forms I would call her with confirmation. If we have not received them by Friday 11/10/15 I would return her call. Pt voiced understanding and had no further questions. Will hold message in my box till forms have been received.

## 2015-11-08 NOTE — Telephone Encounter (Signed)
Looked in TP inbox and did not see forms. Will look on friday

## 2015-11-10 NOTE — Telephone Encounter (Signed)
Forms have been received and placed in TP's to do folder. Called and made pt aware that forms were received and that TP would complete forms when she returns to the office on 11/13/15. Patient voiced understanding and had no further questions. Nothing further needed. Will sign off on message.

## 2015-11-29 ENCOUNTER — Telehealth: Payer: Self-pay | Admitting: Adult Health

## 2015-11-29 NOTE — Telephone Encounter (Signed)
Called and spoke with [place] at [facility] Patient [facility] [phone]). She stated that the [facility] assistance was denied due to the patient's income being at $68,000 a year with 2 people in a household. In order for her to receive the assistance her income amount with two people would need to be no more then $64,800. I told her that I would call the patient and explain the denial.   Called and spoke with the patient. Informed her of the above. She stated that she would have new insurance as of the first of the year and she had enough dulera to last until then. She explained that her new insurance should help with the amount of the inhaler. I explained to her if she had any questions or needed samples before the first of the year to contact our office. She had no further questions and had no further questions.  Nothing further was needed. Will make TP aware.

## 2015-12-11 ENCOUNTER — Encounter: Payer: Self-pay | Admitting: Adult Health

## 2015-12-11 DIAGNOSIS — J45909 Unspecified asthma, uncomplicated: Secondary | ICD-10-CM

## 2015-12-12 MED ORDER — MOMETASONE FURO-FORMOTEROL FUM 100-5 MCG/ACT IN AERO: INHALATION_SPRAY | RESPIRATORY_TRACT | Status: DC

## 2015-12-25 ENCOUNTER — Ambulatory Visit: Payer: BLUE CROSS/BLUE SHIELD | Admitting: Emergency Medicine

## 2016-02-12 ENCOUNTER — Ambulatory Visit: Payer: BLUE CROSS/BLUE SHIELD | Admitting: Emergency Medicine

## 2016-04-25 ENCOUNTER — Other Ambulatory Visit: Payer: Self-pay | Admitting: Nurse Practitioner

## 2016-12-19 ENCOUNTER — Other Ambulatory Visit: Payer: Self-pay | Admitting: Emergency Medicine

## 2016-12-19 DIAGNOSIS — J45909 Unspecified asthma, uncomplicated: Secondary | ICD-10-CM

## 2017-01-16 ENCOUNTER — Encounter: Payer: Self-pay | Admitting: Emergency Medicine

## 2017-01-16 ENCOUNTER — Ambulatory Visit (INDEPENDENT_AMBULATORY_CARE_PROVIDER_SITE_OTHER): Payer: BLUE CROSS/BLUE SHIELD | Admitting: Emergency Medicine

## 2017-01-16 DIAGNOSIS — J45909 Unspecified asthma, uncomplicated: Secondary | ICD-10-CM | POA: Diagnosis not present

## 2017-01-16 DIAGNOSIS — R05 Cough: Secondary | ICD-10-CM | POA: Diagnosis not present

## 2017-01-16 DIAGNOSIS — R059 Cough, unspecified: Secondary | ICD-10-CM

## 2017-01-16 MED ORDER — FLUTICASONE PROPIONATE 50 MCG/ACT NA SUSP: 2.0000 | Freq: Every day | NASAL | 2 refills | Status: DC

## 2017-01-16 MED ORDER — PREDNISONE 10 MG PO TABS: 30.0000 mg | ORAL_TABLET | Freq: Every day | ORAL | 0 refills | Status: AC

## 2017-01-16 NOTE — Progress Notes (Signed)
Subjective:    Patient ID: Katrina Roman, female    DOB: 07/12/1976, 41 y.o.   MRN: [id number]  Asthma  She complains of cough, shortness of breath and wheezing. Pertinent negatives include no ear pain, fever, headaches, postnasal drip, rhinorrhea, sneezing, sore throat or trouble swallowing. Her past medical history is significant for asthma.   41 yo never smoker, hx allergies, GERD, asthma with peripheral eosinophilia, dx at age 5. Referred by Dr Deatra Ina for asthma. She has a hx of ASA sensitivity, also multiple sinus surgeries.  She has been less well controlled since Dec '14 when she may have had URI, has not been able to get back to baseline. At her baseline barely ever needs albuterol. Currently using it 2-3x a day.   ROV 06/22/14 -- follows up for asthma. Has had poor control, suspect UA irritation superimposed on her asthma. We increased her PPI, added nasal washes and steroid. She reports that she has been given pred a few times for persistent throat noise and mucous. She is on nasal steroid, [place], PPI qd. Not on immunotherapy.   ROV 09/02/14 -- follow up for asthma, chronic cough in setting GERD and allergic rhinitis. She was seen by Dr Melvyn Novas a month ago with a dry barking cough. She was changed to [facility] and taught proper technique. Also a pred taper. Ruthe Mannan seems to have helped her.  PFT today > mild AFL with borderline BD response.   ROV 10/04/15 -- follow-up visit for chronic cough and asthma both exacerbated by GERD and allergic rhinitis. She has been doing quite well, but did have an AE mid-September as allergy sx began > was treated with pred + azithro.  She has a new house for the last 7 months.  Uses albuterol about twice a day.   ROV 01/16/17 -- patient has hx asthma, chronic recurrent cough, GERD, rhinitis. Unfortunately she has had a lot of trouble beginning in the fall 17 (Fall has been her worst time). She stopped her Dulera in the fall. Has a lot of cough, SOB, wheeze. Seems to be  refractory to albuterol, at least not very beneficial. Talking and laughing.    Review of Systems  Constitutional: Negative for fever and unexpected weight change.  HENT: Negative for congestion, dental problem, ear pain, nosebleeds, postnasal drip, rhinorrhea, sinus pressure, sneezing, sore throat and trouble swallowing.   Eyes: Negative for redness and itching.  Respiratory: Positive for cough, shortness of breath and wheezing. Negative for chest tightness.   Cardiovascular: Negative for palpitations and leg swelling.  Gastrointestinal: Negative for nausea and vomiting.  Genitourinary: Negative for dysuria.  Musculoskeletal: Negative for joint swelling.  Skin: Negative for rash.  Neurological: Negative for headaches.  Hematological: Does not bruise/bleed easily.  Psychiatric/Behavioral: Negative for dysphoric mood. The patient is not nervous/anxious.        Objective:   Physical Exam Vitals:   01/16/17 1646  BP: 122/90  BP Location: Right Arm  Patient Position: Sitting  Cuff Size: Normal  Pulse: (!) 101  SpO2: 95%  Weight: 245 lb 8 oz (111.4 kg)  Height: 5' 1"  (1.549 m)   Gen: Pleasant, overwt, in no distress, normal affect  ENT: No lesions, mouth clear, oropharynx clear, some mild nasal congestion, hoarse voice  Neck: No JVD, she does have soft insp and exp stridor.  Lungs: No use of accessory muscles, clear without rales or rhonchi, no wheeze  Cardiovascular: RRR, heart sounds normal, no murmur or gallops, no peripheral edema  Musculoskeletal: No deformities, no cyanosis or clubbing  Neuro: alert, non focal  Skin: Warm, no lesions or rashes      Assessment & Plan:  Cough Her current symptoms sound mostly upper airway in nature, they have not responded well to albuterol. Clearly she does also have asthma and it could be crossover. I will try to ramp up her GERD therapy, rhinitis therapy, off on restarting Dulera for now although she may ultimately need it. I will  restart her Singulair, nasal steroid, increase omeprazole empirically.   We will treat you with prednisone 75m daily for 5 days Start fluticasone NS 2 sprays once a day each side Restart singulair once very evening Continue loratadine 161mdaily Hold off on restarting Dulera for now. We will probably restart soon.  Try increasing your omeprazole to 4045mwice a day for 2 weeks Take albuterol 2 puffs up to every 4 hours if needed for shortness of breath.  Follow with Dr ByrLamonte Sakai 1 month   Intrinsic asthma Albuterol prn for now.  [facility], for now will try to avoid it's effects on UA.    RobBaltazar ApoD, PhD 01/16/2017, 5:21 PM [facility] [phone] if no answer [phone]

## 2017-01-16 NOTE — Assessment & Plan Note (Signed)
Albuterol prn for now.  Likely will ned to restart [facility] ultimately, for now will try to avoid it's effects on UA.

## 2017-01-16 NOTE — Assessment & Plan Note (Signed)
Her current symptoms sound mostly upper airway in nature, they have not responded well to albuterol. Clearly she does also have asthma and it could be crossover. I will try to ramp up her GERD therapy, rhinitis therapy, off on restarting Dulera for now although she may ultimately need it. I will restart her Singulair, nasal steroid, increase omeprazole empirically.   We will treat you with prednisone 63m daily for 5 days Start fluticasone NS 2 sprays once a day each side Restart singulair once very evening Continue loratadine 161mdaily Hold off on restarting Dulera for now. We will probably restart soon.  Try increasing your omeprazole to 4026mwice a day for 2 weeks Take albuterol 2 puffs up to every 4 hours if needed for shortness of breath.  Follow with Dr ByrLamonte Sakai 1 month

## 2017-01-16 NOTE — Patient Instructions (Addendum)
We will treat you with prednisone 35m daily for 5 days Start fluticasone NS 2 sprays once a day each side Restart singulair once very evening Continue loratadine 146mdaily Hold off on restarting Dulera for now. We will probably restart soon.  Try increasing your omeprazole to 4065mwice a day for 2 weeks Take albuterol 2 puffs up to every 4 hours if needed for shortness of breath.  Follow with Dr ByrLamonte Sakai 1 month

## 2017-01-21 ENCOUNTER — Telehealth: Payer: Self-pay | Admitting: Emergency Medicine

## 2017-01-21 MED ORDER — BUDESONIDE 32 MCG/ACT NA SUSP: 2.0000 | Freq: Every day | NASAL | 6 refills | Status: DC

## 2017-01-21 NOTE — Telephone Encounter (Signed)
Fluticasone has been changed to rhinocort aqua per the preferred drug.  This has been sent to the pharmacy and med list has been updated.

## 2017-01-21 NOTE — Telephone Encounter (Signed)
We received a fax for the PA for the fluticasone.  pts insurance preferred nasal corticosteroid is budesonide (generic rhinocort aqua).  RB do you want to stay with the PA for the fluticasone or would you like to change to the generic preferred medication.  Please advise. thanks

## 2017-01-21 NOTE — Telephone Encounter (Signed)
Ok to switch to the preferred formulation

## 2017-01-22 ENCOUNTER — Telehealth: Payer: Self-pay | Admitting: Emergency Medicine

## 2017-01-22 MED ORDER — MONTELUKAST SODIUM 10 MG PO TABS: ORAL_TABLET | ORAL | 3 refills | Status: DC

## 2017-01-22 NOTE — Telephone Encounter (Signed)
Spoke with pt. She is needing a refill on Singulair. Rx has been sent to [facility]. Nothing further was needed.

## 2017-02-14 ENCOUNTER — Ambulatory Visit (INDEPENDENT_AMBULATORY_CARE_PROVIDER_SITE_OTHER): Payer: BLUE CROSS/BLUE SHIELD | Admitting: Emergency Medicine

## 2017-02-14 ENCOUNTER — Encounter: Payer: Self-pay | Admitting: Emergency Medicine

## 2017-02-14 DIAGNOSIS — J45909 Unspecified asthma, uncomplicated: Secondary | ICD-10-CM

## 2017-02-14 DIAGNOSIS — R05 Cough: Secondary | ICD-10-CM | POA: Diagnosis not present

## 2017-02-14 DIAGNOSIS — R059 Cough, unspecified: Secondary | ICD-10-CM

## 2017-02-14 NOTE — Assessment & Plan Note (Signed)
Continue to try address contributing factors including allergic rhinitis and GERD. She may have benefited from omeprazole increase. I will add back Dulera if these maneuvers do not help. See if this is beneficial. If she continues to have symptoms she may need an upper airway

## 2017-02-14 NOTE — Assessment & Plan Note (Signed)
Will attempt to address UA contributors. If she still has  Sx in 2 weeks will trial back on Dulera to see if she gets benefit.

## 2017-02-14 NOTE — Progress Notes (Signed)
Subjective:    Patient ID: Katrina Roman, female    DOB: 1976/10/26, 41 y.o.   MRN: [id number]  Asthma  She complains of cough, shortness of breath and wheezing. Pertinent negatives include no ear pain, fever, headaches, postnasal drip, rhinorrhea, sneezing, sore throat or trouble swallowing. Her past medical history is significant for asthma.   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 Referred by Dr Deatra Ina for asthma. She has a hx of ASA sensitivity, also multiple sinus surgeries.  She has been less well controlled since Dec '14 when she may have had URI, has not been able to get back to baseline. At her baseline barely ever needs albuterol. Currently using it 2-3x a day.   ROV 06/22/14 -- follows up for asthma. Has had poor control, suspect UA irritation superimposed on her asthma. We increased her PPI, added nasal washes and steroid. She reports that she has been given pred a few times for persistent throat noise and mucous. She is on nasal steroid, NSW, PPI qd. Not on immunotherapy.   ROV 09/02/14 -- follow up for asthma, chronic cough in setting GERD and allergic rhinitis. She was seen by Dr Melvyn Novas a month ago with a dry barking cough. She was changed to [facility] and taught proper technique. Also a pred taper. Ruthe Mannan seems to have helped her.  PFT today > mild AFL with borderline BD response.   ROV 10/04/15 -- follow-up visit for chronic cough and asthma both exacerbated by GERD and allergic rhinitis. She has been doing quite well, but did have an AE mid-September as allergy sx began > was treated with pred + azithro.  She has a new house for the last 7 months.  Uses albuterol about twice a day.   ROV 01/16/17 -- patient has hx asthma, chronic recurrent cough, GERD, rhinitis. Unfortunately she has had a lot of trouble beginning in the fall 17 (Fall has been her worst time). She stopped her Dulera in the fall. Has a lot of cough, SOB, wheeze. Seems to be  refractory to albuterol, at least not very beneficial. Talking and laughing.   ROV 02/14/17 -- hx mild obstruction / asthma, chronic recurrent cough, GERD, rhinitis. We treated her for 5 days w pred last time, started flonase. Increased omeprazole to bid temporarily.  She believes that the flonase has helped. On loratadine. Continues to have cough, throat noise, hoarse voice.    Review of Systems  Constitutional: Negative for fever and unexpected weight change.  HENT: Negative for congestion, dental problem, ear pain, nosebleeds, postnasal drip, rhinorrhea, sinus pressure, sneezing, sore throat and trouble swallowing.   Eyes: Negative for redness and itching.  Respiratory: Positive for cough, shortness of breath and wheezing. Negative for chest tightness.   Cardiovascular: Negative for palpitations and leg swelling.  Gastrointestinal: Negative for nausea and vomiting.  Genitourinary: Negative for dysuria.  Musculoskeletal: Negative for joint swelling.  Skin: Negative for rash.  Neurological: Negative for headaches.  Hematological: Does not bruise/bleed easily.  Psychiatric/Behavioral: Negative for dysphoric mood. The patient is not nervous/anxious.        Objective:   Physical Exam Vitals:   02/14/17 0859  BP: 130/90  BP Location: Left Arm  Patient Position: Sitting  Cuff Size: Normal  Pulse: (!) 105  SpO2: 96%  Weight: 247 lb (112 kg)  Height: 5' 1"  (1.549 m)   Gen: Pleasant, overwt, in no distress, normal affect  ENT: No lesions, mouth clear,  oropharynx clear, some mild nasal congestion, hoarse voice  Neck: No JVD, continues to have soft insp and exp stridor.  Lungs: No use of accessory muscles, clear without rales or rhonchi, no wheeze  Cardiovascular: RRR, heart sounds normal, no murmur or gallops, no peripheral edema  Musculoskeletal: No deformities, no cyanosis or clubbing  Neuro: alert, non focal  Skin: Warm, no lesions or rashes      Assessment & Plan:   Cough Continue to try address contributing factors including allergic rhinitis and GERD. She may have benefited from omeprazole increase. I will add back Dulera if these maneuvers do not help. See if this is beneficial. If she continues to have symptoms she may need an upper airway  Intrinsic asthma Will attempt to address UA contributors. If she still has  Sx in 2 weeks will trial back on Dulera to see if she gets benefit.    Baltazar Apo, MD, PhD 02/14/2017, 9:26 AM [facility] [phone] or if no answer [phone]

## 2017-02-14 NOTE — Patient Instructions (Signed)
Please continue your loratadine 51m daily Continue flonase 2 sprays each side daily Try starting nasal saline rinses once a day Increase your omeprazole back to twice a day.  If you are still having cough, increased mucous after 2 weeks then try restarting Dulera 2 puffs twice a day until we follow up.  Continue to use albuterol if needed for shortness of breath or wheezing.         Follow with Dr BLamonte Sakaiin 1 month or next available.

## 2017-02-19 ENCOUNTER — Ambulatory Visit: Payer: BLUE CROSS/BLUE SHIELD | Admitting: Emergency Medicine

## 2017-02-25 ENCOUNTER — Other Ambulatory Visit: Payer: Self-pay

## 2017-02-25 MED ORDER — MOMETASONE FURO-FORMOTEROL FUM 100-5 MCG/ACT IN AERO: INHALATION_SPRAY | RESPIRATORY_TRACT | 0 refills | Status: DC

## 2017-04-01 ENCOUNTER — Ambulatory Visit (INDEPENDENT_AMBULATORY_CARE_PROVIDER_SITE_OTHER): Payer: BLUE CROSS/BLUE SHIELD | Admitting: Orthopedic Surgery

## 2017-04-01 ENCOUNTER — Encounter (INDEPENDENT_AMBULATORY_CARE_PROVIDER_SITE_OTHER): Payer: Self-pay | Admitting: Orthopedic Surgery

## 2017-04-01 ENCOUNTER — Ambulatory Visit (INDEPENDENT_AMBULATORY_CARE_PROVIDER_SITE_OTHER): Payer: Self-pay

## 2017-04-01 VITALS — Ht 61.0 in | Wt 247.0 lb

## 2017-04-01 DIAGNOSIS — G5603 Carpal tunnel syndrome, bilateral upper limbs: Secondary | ICD-10-CM | POA: Diagnosis not present

## 2017-04-01 DIAGNOSIS — M25571 Pain in right ankle and joints of right foot: Secondary | ICD-10-CM

## 2017-04-01 MED ORDER — METHYLPREDNISOLONE ACETATE 40 MG/ML IJ SUSP
40.0000 mg | INTRAMUSCULAR | Status: AC | PRN
  Administered 2017-04-01: 40 mg via INTRA_ARTICULAR

## 2017-04-01 MED ORDER — LIDOCAINE HCL 1 % IJ SOLN
2.0000 mL | INTRAMUSCULAR | Status: AC | PRN
  Administered 2017-04-01: 2 mL

## 2017-04-01 NOTE — Progress Notes (Signed)
Office Visit Note   Patient: Katrina Roman           Date of Birth: 06/19/1976           MRN: [id number] Visit Date: 04/01/2017              Requested by: [facility] [place], [place] [address] PCP: Pcp Not In System  Chief Complaint  Patient presents with  . Right Ankle - Pain  . Left Wrist - Pain  . Right Wrist - Pain      HPI: Patient is status post triple arthrodesis and has been having some increasing pain in the right ankle over the anterior medial and anterior lateral joint line. Pain with activities of daily living pain with walking. Patient complains of numbness and weakness in both hands in the thenar eminence including thumb and index finger. Patient states the pain and numbness is worse in the morning. Patient states that she cannot take anti-inflammatories due to having a asthma exacerbated by nonsteroidals.  Assessment & Plan: Visit Diagnoses:  1. Pain in right ankle and joints of right foot   2. Carpal tunnel syndrome, bilateral     Plan: The right ankle was injected. Discussed that if she only gets temporary relief we could consider arthroscopic debridement.   Discussed that with her carpal tunnel symptoms she should start with night splints and Tylenol as needed for pain if she is still symptomatic she will call us and we will set up nerve conduction studies.  Follow-Up Instructions: Return if symptoms worsen or fail to improve.   Ortho Exam  Patient is alert, oriented, no adenopathy, well-dressed, normal affect, normal respiratory effort. Examination the patient's both upper extremities she has no significant focal weakness in either upper extremity the first dorsal extensor compartment scaphoid scapholunate and TFCC are nontender to palpation bilaterally. She has some mild tenderness to palpation over the carpal tunnel and wrist flexion does reproduce some of her symptoms. Examination the right ankle she has a stable triple  arthrodesis she is tender to palpation anterior medial anterolateral joint line there is no crepitation with range of motion anterior drawer stable.  Imaging: Xr Ankle Complete Right  Result Date: 04/01/2017 Three-view radiographs of the right ankle shows stable subtalar and talonavicular fusion. Patient does have some osteophytic bone spurs around the tibial talar joint the joint space is congruent.   Labs: No results found for: HGBA1C, ESRSEDRATE, CRP, LABURIC, REPTSTATUS, GRAMSTAIN, CULT, LABORGA  Orders:  Orders Placed This Encounter  Procedures  . XR Ankle Complete Right   No orders of the defined types were placed in this encounter.    Procedures: Medium Joint Inj Date/Time: 04/01/2017 4:37 PM Performed by: DUDA, MARCUS V Authorized by: Newt Minion   Consent Given by:  Patient Site marked: the procedure site was marked   Timeout: prior to procedure the correct patient, procedure, and site was verified   Indications:  Pain and diagnostic evaluation Location:  Ankle Site:  R ankle Prep: patient was prepped and draped in usual sterile fashion   Needle Size:  22 G Needle Length:  1.5 inches Approach:  Anteromedial Ultrasound Guided: No   Fluoroscopic Guidance: No   Medications:  2 mL lidocaine 1 %; 40 mg methylPREDNISolone acetate 40 MG/ML Aspiration Attempted: No   Patient tolerance:  Patient tolerated the procedure well with no immediate complications    Clinical Data: No additional findings.  ROS:  All other  systems negative, except as noted in the HPI. Review of Systems  Objective: Vital Signs: Ht 5' 1"  (1.549 m)   Wt 247 lb (112 kg)   BMI 46.67 kg/m   Specialty Comments:  No specialty comments available.  PMFS History: Patient Active Problem List   Diagnosis Date Noted  . Allergic rhinitis 10/04/2015  . Intrinsic asthma 05/17/2014  . Cough 05/17/2014  . EOSINOPHILIA 01/15/2011  . PUD 01/15/2011  . GERD 07/31/2010   Past Medical History:    Diagnosis Date  . Asthma   . GERD (gastroesophageal reflux disease)   . Peripheral eosinophilia   . PUD (peptic ulcer disease)   . Seasonal allergies     Family History  Problem Relation Age of Onset  . Skin cancer Maternal Grandfather   . Hypertension Father   . Hypertension Mother   . Diabetes Father     Past Surgical History:  Procedure Laterality Date  . ESOPHAGOGASTRODUODENOSCOPY  08/2010   noraml esophagus,3 1-mm superficial gastric ulcers seen in the antrum, No evidence H pylori  . FOOT SURGERY     Right; three screws,chronic frature  . NASAL SINUS SURGERY  1997 and 2009   x 2  . TONSILLECTOMY     Social History   Occupational History  . [place] History Main Topics  . Smoking status: Never Smoker  . Smokeless tobacco: Never Used  . Alcohol use No  . Drug use: No  . Sexual activity: Not on file

## 2017-04-07 ENCOUNTER — Ambulatory Visit: Payer: BLUE CROSS/BLUE SHIELD | Admitting: Emergency Medicine

## 2017-04-20 ENCOUNTER — Other Ambulatory Visit: Payer: Self-pay | Admitting: Emergency Medicine

## 2017-04-28 ENCOUNTER — Telehealth (INDEPENDENT_AMBULATORY_CARE_PROVIDER_SITE_OTHER): Payer: Self-pay | Admitting: Radiology

## 2017-04-28 NOTE — Telephone Encounter (Signed)
Patient emailed and said-   I received a cortisone shot in the office on Apr 17th. Dr Sharol Given is who gave the shot. My foot is still very swollen. Is this normal? How can I make it go away?   Phone: [phone]  Email: [email or web address]

## 2017-04-28 NOTE — Telephone Encounter (Signed)
I called and left voicemail advised patient to please come in for repeat evaluation. Looking at office notes if patient is symptomatic or having problems she would need repeat evaluation. Swelling can be normal, and it also cannot be, need physical exam for further treatment plan. Advised she call the office and schedule an appointment.

## 2017-06-26 ENCOUNTER — Ambulatory Visit (INDEPENDENT_AMBULATORY_CARE_PROVIDER_SITE_OTHER): Payer: BLUE CROSS/BLUE SHIELD | Admitting: Podiatry

## 2017-06-26 ENCOUNTER — Ambulatory Visit (INDEPENDENT_AMBULATORY_CARE_PROVIDER_SITE_OTHER): Payer: BLUE CROSS/BLUE SHIELD

## 2017-06-26 ENCOUNTER — Encounter: Payer: Self-pay | Admitting: Podiatry

## 2017-06-26 DIAGNOSIS — M779 Enthesopathy, unspecified: Secondary | ICD-10-CM

## 2017-06-26 DIAGNOSIS — M2141 Flat foot [pes planus] (acquired), right foot: Secondary | ICD-10-CM

## 2017-06-26 DIAGNOSIS — M2142 Flat foot [pes planus] (acquired), left foot: Secondary | ICD-10-CM | POA: Diagnosis not present

## 2017-06-26 NOTE — Progress Notes (Signed)
Subjective:    Patient ID: Katrina Roman, female   DOB: 41 y.o.   MRN: [id number]   HPI patient presents stating she's having a lot of pain in the outside of her right ankle and she's had previous fusion done 8 years ago and has severe flatfoot deformity bilateral    Review of Systems  All other systems reviewed and are negative.       Objective:  Physical Exam  Cardiovascular: Intact distal pulses.   Pulmonary/Chest: Breath sounds normal.  Musculoskeletal: Normal range of motion.  Neurological: She is alert.  Skin: Skin is warm.  Nursing note and vitals reviewed.  neurovascular status intact muscle strength adequate range of motion within normal limits with patient found to have discomfort around the lateral ankle gutter right more the peroneal complex with no range of motion subtalar joint talonavicular joint right secondary to fusion with severe flatfoot deformity noted left. Found have good digital perfusion well oriented 3 and states she did have a cortisone injection on top of the right foot and had a strange reaction and wants to get it done but wants someone here with her     Assessment:    Probability for inflammatory condition of the sinus tarsi right and the inflammatory process outside with also patient noted to have severe flatfoot deformity left     Plan:    H&P conditions reviewed and discussed careful injection of the right lateral ankle gutter and she wants to do this wants to hold off a couple weeks. I also discussed orthotics which I think would be beneficial and she'll we have point to recheck in 2 weeks to have orthotics fabricated and then I will see her for lateral injection and I do want her to take Benadryl prior to [place] doing the procedure  X-ray indicates screws in place triple arthrodesis right with patient's left foot showing severe flatfoot deformity

## 2017-06-26 NOTE — Progress Notes (Signed)
   Subjective:    Patient ID: Katrina Roman, female    DOB: 02/17/1976, 41 y.o.   MRN: [id number]  HPI  Chief Complaint  Patient presents with  . Foot Pain    B/L feet are flat, bone spur, pain 3 months.     Review of Systems  All other systems reviewed and are negative.      Objective:   Physical Exam        Assessment & Plan:

## 2017-07-10 ENCOUNTER — Ambulatory Visit: Payer: BLUE CROSS/BLUE SHIELD | Admitting: Orthotics

## 2017-09-18 ENCOUNTER — Encounter: Payer: Self-pay | Admitting: Emergency Medicine

## 2017-09-18 MED ORDER — PREDNISONE 10 MG PO TABS
ORAL_TABLET | ORAL | 0 refills | Status: DC
Start: 2017-09-18 — End: 2017-09-30

## 2017-09-18 MED ORDER — BUDESONIDE-FORMOTEROL FUMARATE 80-4.5 MCG/ACT IN AERO: 2.0000 | INHALATION_SPRAY | Freq: Two times a day (BID) | RESPIRATORY_TRACT | 1 refills | Status: DC

## 2017-09-18 NOTE — Telephone Encounter (Signed)
email sent to pt to find out what pharmacy she would like these rx send to .

## 2017-09-19 ENCOUNTER — Encounter: Payer: Self-pay | Admitting: *Deleted

## 2017-09-19 NOTE — Telephone Encounter (Signed)
RB---pt is wanting to get the pred taper sent in to keep on hand since this is her bad time of the year.  Are you ok with sending this in?  Thanks

## 2017-09-23 NOTE — Telephone Encounter (Signed)
OK with me to give her a script for pred taper as follows, AS LONG AS SHE AGREE TO CALL us SHOULD SHE NEED TO FILL AND START THE MEDICATION.   Prednisone: Take 68m daily for 3 days, then 335mdaily for 3 days, then 2059maily for 3 days, then 43m55mily for 3 days, then stop. No refills

## 2017-09-25 ENCOUNTER — Telehealth: Payer: Self-pay | Admitting: Emergency Medicine

## 2017-09-25 NOTE — Telephone Encounter (Signed)
Spoke with pharmacist at [facility]. She stated that they received a new request for Prednisone 70m from the patient. Their directions state to take 1 pill daily. Her chart indicates a Prednisone taper was sent in on 09/25/17.   Left a message for patient to call back to see if she wanted this to be seen to mail order or local pharmacy as noted in the MLabettemessage from 09/24/17.

## 2017-09-26 NOTE — Telephone Encounter (Signed)
ATC pt, no answer. Left message that would would try to call back later. Our phone lines in the office are down.

## 2017-09-29 NOTE — Telephone Encounter (Signed)
lmtcb X2 for pt.  

## 2017-09-30 MED ORDER — PREDNISONE 10 MG PO TABS
ORAL_TABLET | ORAL | 0 refills | Status: DC
Start: 2017-09-30 — End: 2017-10-01

## 2017-09-30 NOTE — Telephone Encounter (Signed)
lmomtcb x 3 for the pt.

## 2017-10-01 MED ORDER — PREDNISONE 10 MG PO TABS: ORAL_TABLET | ORAL | 0 refills | Status: DC

## 2017-10-01 NOTE — Telephone Encounter (Signed)
Called and spoke with pt and she is just needing the taper to be sent in so it can be on hold in case she needs this.  This has been done and nothing further is needed.

## 2017-11-11 ENCOUNTER — Other Ambulatory Visit: Payer: Self-pay | Admitting: Emergency Medicine

## 2017-11-18 ENCOUNTER — Other Ambulatory Visit: Payer: Self-pay | Admitting: *Deleted

## 2017-11-18 MED ORDER — MOMETASONE FURO-FORMOTEROL FUM 100-5 MCG/ACT IN AERO: INHALATION_SPRAY | RESPIRATORY_TRACT | 0 refills | Status: DC

## 2017-12-03 ENCOUNTER — Other Ambulatory Visit: Payer: Self-pay | Admitting: Emergency Medicine

## 2017-12-17 ENCOUNTER — Other Ambulatory Visit: Payer: Self-pay | Admitting: Emergency Medicine

## 2018-01-09 ENCOUNTER — Ambulatory Visit (INDEPENDENT_AMBULATORY_CARE_PROVIDER_SITE_OTHER): Payer: 59 | Admitting: Emergency Medicine

## 2018-01-09 ENCOUNTER — Encounter: Payer: Self-pay | Admitting: Emergency Medicine

## 2018-01-09 VITALS — BP 138/80 | HR 100 | Ht 61.0 in | Wt 247.4 lb

## 2018-01-09 DIAGNOSIS — J4520 Mild intermittent asthma, uncomplicated: Secondary | ICD-10-CM | POA: Diagnosis not present

## 2018-01-09 MED ORDER — BUDESONIDE-FORMOTEROL FUMARATE 80-4.5 MCG/ACT IN AERO: 2.0000 | INHALATION_SPRAY | Freq: Two times a day (BID) | RESPIRATORY_TRACT | 4 refills | Status: DC

## 2018-01-09 NOTE — Assessment & Plan Note (Signed)
Rarely managed on Zantac

## 2018-01-09 NOTE — Assessment & Plan Note (Signed)
  Please continue your nasal saline washes daily. Please continue Claritin and Singulair as you have been taking them. Remember to restart your fluticasone nasal spray, 2 sprays each nostril once a day during the spring and fall months.

## 2018-01-09 NOTE — Progress Notes (Signed)
Subjective:    Patient ID: Katrina Roman, female    DOB: November 30, 1976, 42 y.o.   MRN: [id number]  Asthma  She complains of cough, shortness of breath and wheezing. Pertinent negatives include no ear pain, fever, headaches, postnasal drip, rhinorrhea, sneezing, sore throat or trouble swallowing. Her past medical history is significant for asthma.   42 yo never smoker, hx allergies, GERD, asthma with peripheral eosinophilia, dx at age 42. Referred by Dr Deatra Ina for asthma. She has a hx of ASA sensitivity, also multiple sinus surgeries.  She has been less well controlled since Dec '14 when she may have had URI, has not been able to get back to baseline. At her baseline barely ever needs albuterol. Currently using it 2-3x a day.   ROV 06/22/14 -- follows up for asthma. Has had poor control, suspect UA irritation superimposed on her asthma. We increased her PPI, added nasal washes and steroid. She reports that she has been given pred a few times for persistent throat noise and mucous. She is on nasal steroid, NSW, PPI qd. Not on immunotherapy.   ROV 09/02/14 -- follow up for asthma, chronic cough in setting GERD and allergic rhinitis. She was seen by Dr Melvyn Novas a month ago with a dry barking cough. She was changed to [facility] and taught proper technique. Also a pred taper. Ruthe Mannan seems to have helped her.  PFT today > mild AFL with borderline BD response.   ROV 10/04/15 -- follow-up visit for chronic cough and asthma both exacerbated by GERD and allergic rhinitis. She has been doing quite well, but did have an AE mid-September as allergy sx began > was treated with pred + azithro.  She has a new house for the last 7 months.  Uses albuterol about twice a day.   ROV 01/16/17 -- patient has hx asthma, chronic recurrent cough, GERD, rhinitis. Unfortunately she has had a lot of trouble beginning in the fall 17 (Fall has been her worst time). She stopped her Dulera in the fall. Has a lot of cough, SOB, wheeze. Seems to be  refractory to albuterol, at least not very beneficial. Talking and laughing.   ROV 02/14/17 -- hx mild obstruction / asthma, chronic recurrent cough, GERD, rhinitis. We treated her for 5 days w pred last time, started flonase. Increased omeprazole to bid temporarily.  She believes that the flonase has helped. On loratadine. Continues to have cough, throat noise, hoarse voice.   ROV 01/09/18 --follow-up visit 42 year old woman with asthma and mild intermittent obstruction, chronic recurrent cough, GERD, rhinitis.  She returns today for regular follow-up. She had been managed on Dulera, no longer on formulary. Changed to Symbicort 80. She has done quite well. No flares, no pred / abx. Uses NSW daily   Review of Systems  Constitutional: Negative for fever and unexpected weight change.  HENT: Negative for congestion, dental problem, ear pain, nosebleeds, postnasal drip, rhinorrhea, sinus pressure, sneezing, sore throat and trouble swallowing.   Eyes: Negative for redness and itching.  Respiratory: Positive for cough, shortness of breath and wheezing. Negative for chest tightness.   Cardiovascular: Negative for palpitations and leg swelling.  Gastrointestinal: Negative for nausea and vomiting.  Genitourinary: Negative for dysuria.  Musculoskeletal: Negative for joint swelling.  Skin: Negative for rash.  Neurological: Negative for headaches.  Hematological: Does not bruise/bleed easily.  Psychiatric/Behavioral: Negative for dysphoric mood. The patient is not nervous/anxious.        Objective:   Physical Exam Vitals:  01/09/18 1615  BP: 138/80  Pulse: 100  SpO2: 99%  Weight: 247 lb 6.4 oz (112.2 kg)  Height: 5' 1"  (1.549 m)   Gen: Pleasant, overwt, in no distress, normal affect  ENT: No lesions, mouth clear, oropharynx clear, some mild nasal congestion, hoarse voice  Neck: No JVD, continues to have soft insp and exp stridor.  Lungs: No use of accessory muscles, clear without rales or  rhonchi, no wheeze  Cardiovascular: RRR, heart sounds normal, no murmur or gallops, no peripheral edema  Musculoskeletal: No deformities, no cyanosis or clubbing  Neuro: alert, non focal  Skin: Warm, no lesions or rashes      Assessment & Plan:  GERD Rarely managed on Zantac  Allergic rhinitis  Please continue your nasal saline washes daily. Please continue Claritin and Singulair as you have been taking them. Remember to restart your fluticasone nasal spray, 2 sprays each nostril once a day during the spring and fall months.  Intrinsic asthma We will continue Symbicort 80/4.5 mcg, 2 puffs twice a day.  Remember to rinse and gargle after using this medication  Cough With contributions from her GERD, allergic rhinitis, also possibly some involvement of her asthma although suspect this is mostly upper airway at this time.   Baltazar Apo, MD, PhD 01/09/2018, 4:46 PM [facility] [phone] or if no answer [phone]

## 2018-01-09 NOTE — Patient Instructions (Addendum)
We will continue Symbicort 80/4.5 mcg, 2 puffs twice a day.  Remember to rinse and gargle after using this medication Please continue your nasal saline washes daily. Please continue Claritin and Singulair as you have been taking them. Remember to restart your fluticasone nasal spray, 2 sprays each nostril once a day during the spring and fall months. Continue your Zantac as you are taking it Get your flu shot annually Follow with Dr Lamonte Sakai in 12 months or sooner if you have any problems

## 2018-01-09 NOTE — Assessment & Plan Note (Signed)
We will continue Symbicort 80/4.5 mcg, 2 puffs twice a day.  Remember to rinse and gargle after using this medication

## 2018-01-09 NOTE — Assessment & Plan Note (Signed)
With contributions from her GERD, allergic rhinitis, also possibly some involvement of her asthma although suspect this is mostly upper airway at this time.

## 2018-01-14 ENCOUNTER — Encounter: Payer: Self-pay | Admitting: Emergency Medicine

## 2018-01-14 MED ORDER — AMOXICILLIN-POT CLAVULANATE 875-125 MG PO TABS: 1.0000 | ORAL_TABLET | Freq: Two times a day (BID) | ORAL | 0 refills | Status: DC

## 2018-01-14 NOTE — Telephone Encounter (Signed)
OK to treat with Augmentin 858m bid x 7 days.

## 2018-01-14 NOTE — Telephone Encounter (Signed)
Rx sent E-mail sent to patient to make her aware

## 2018-01-14 NOTE — Telephone Encounter (Signed)
RB please advise. Thanks.  

## 2018-01-19 ENCOUNTER — Encounter: Payer: Self-pay | Admitting: Emergency Medicine

## 2018-01-19 NOTE — Telephone Encounter (Signed)
RB please advise on ventolin HFA and neb solution. Thanks   Marleta, Lapierre     01/19/18 3:51 PM  Can I get 90 day prescriptions mailed to me? I am having problems with the mail order company, and I would like to have the prescriptions in my physical possession.  I will need :  Montelukast  Symbicort  Ventolin Inhaler  Liquid Ventolin (for my nebulizer)   Thanks so much!

## 2018-01-21 NOTE — Telephone Encounter (Signed)
Okay to refill these medications

## 2018-01-22 ENCOUNTER — Other Ambulatory Visit: Payer: Self-pay

## 2018-01-22 MED ORDER — ALBUTEROL SULFATE HFA 108 (90 BASE) MCG/ACT IN AERS: 2.0000 | INHALATION_SPRAY | Freq: Four times a day (QID) | RESPIRATORY_TRACT | 1 refills | Status: DC | PRN

## 2018-01-22 MED ORDER — MONTELUKAST SODIUM 10 MG PO TABS: ORAL_TABLET | ORAL | 0 refills | Status: DC

## 2018-01-22 MED ORDER — BUDESONIDE-FORMOTEROL FUMARATE 80-4.5 MCG/ACT IN AERO: 2.0000 | INHALATION_SPRAY | Freq: Two times a day (BID) | RESPIRATORY_TRACT | 3 refills | Status: DC

## 2018-01-22 MED ORDER — ALBUTEROL SULFATE (2.5 MG/3ML) 0.083% IN NEBU: 2.5000 mg | INHALATION_SOLUTION | Freq: Four times a day (QID) | RESPIRATORY_TRACT | 3 refills | Status: DC | PRN

## 2018-06-27 ENCOUNTER — Other Ambulatory Visit: Payer: Self-pay | Admitting: Emergency Medicine

## 2018-12-02 ENCOUNTER — Ambulatory Visit: Payer: 59 | Admitting: Emergency Medicine

## 2018-12-07 ENCOUNTER — Telehealth: Payer: Self-pay | Admitting: Emergency Medicine

## 2018-12-07 ENCOUNTER — Telehealth: Payer: 59 | Admitting: Family Medicine

## 2018-12-07 DIAGNOSIS — J329 Chronic sinusitis, unspecified: Secondary | ICD-10-CM

## 2018-12-07 DIAGNOSIS — J45909 Unspecified asthma, uncomplicated: Secondary | ICD-10-CM

## 2018-12-07 MED ORDER — AMOXICILLIN-POT CLAVULANATE 875-125 MG PO TABS: 1.0000 | ORAL_TABLET | Freq: Two times a day (BID) | ORAL | 0 refills | Status: AC

## 2018-12-07 NOTE — Progress Notes (Signed)

## 2018-12-07 NOTE — Telephone Encounter (Signed)
Returned patient call, per patient she states her nebulizer machine is old and she would like a new one. Shes states she has had it 6-7 years and she got it from a DME company in [place], [place]. Unable to recall the name or any identifying information for the DME company.   SG please advise if okay to place order for nebulizer machine as RB is not in the office. Thanks.

## 2018-12-07 NOTE — Addendum Note (Signed)
Addended by: Vivia Ewing on: 12/07/2018 11:38 AM   Modules accepted: Orders

## 2018-12-07 NOTE — Telephone Encounter (Signed)
Order placed for nebulizer machine. Call made to patient to make aware order has been placed and it takes about 2 weeks. Voiced understanding. Nothing further is needed at this time.

## 2018-12-07 NOTE — Telephone Encounter (Signed)
Please place the order  for the neb machine. Thanks so much.

## 2018-12-31 ENCOUNTER — Telehealth: Payer: Self-pay | Admitting: Emergency Medicine

## 2018-12-31 NOTE — Telephone Encounter (Signed)
Called and spoke with patient regarding update on her Nebulizer that was ordered on 12/07/18 [facility] with [facility] spoke with Maudie Mercury with [facility] in [facility] They are researching the order, and will provide an update later today Will keep in triage to be updated.

## 2019-01-01 NOTE — Telephone Encounter (Signed)
I have made documentation on the pt's appointment notes that she needs a new nebulizer machine. Message will be closed.

## 2019-01-01 NOTE — Telephone Encounter (Signed)
Called and spoke with patient regarding neb machine Pt is in need of an appt in order to have neb machine placed per [facility] in [place] Pt has appt with RB 01/07/2019 at 4:30pm  Routing message to [place] for heads up. Thank you.

## 2019-01-07 ENCOUNTER — Ambulatory Visit (INDEPENDENT_AMBULATORY_CARE_PROVIDER_SITE_OTHER): Payer: 59 | Admitting: Emergency Medicine

## 2019-01-07 ENCOUNTER — Encounter: Payer: Self-pay | Admitting: Emergency Medicine

## 2019-01-07 VITALS — BP 116/82 | HR 89 | Ht 61.0 in | Wt 247.0 lb

## 2019-01-07 DIAGNOSIS — J45909 Unspecified asthma, uncomplicated: Secondary | ICD-10-CM | POA: Diagnosis not present

## 2019-01-07 DIAGNOSIS — J301 Allergic rhinitis due to pollen: Secondary | ICD-10-CM | POA: Diagnosis not present

## 2019-01-07 DIAGNOSIS — K219 Gastro-esophageal reflux disease without esophagitis: Secondary | ICD-10-CM | POA: Diagnosis not present

## 2019-01-07 MED ORDER — BUDESONIDE-FORMOTEROL FUMARATE 80-4.5 MCG/ACT IN AERO: 2.0000 | INHALATION_SPRAY | Freq: Two times a day (BID) | RESPIRATORY_TRACT | 0 refills | Status: DC

## 2019-01-07 MED ORDER — PANTOPRAZOLE SODIUM 40 MG PO TBEC: 40.0000 mg | DELAYED_RELEASE_TABLET | Freq: Every day | ORAL | 11 refills | Status: DC

## 2019-01-07 MED ORDER — FLUTICASONE PROPIONATE 50 MCG/ACT NA SUSP: 2.0000 | Freq: Every day | NASAL | 11 refills | Status: DC

## 2019-01-07 NOTE — Patient Instructions (Signed)
Continue Symbicort 2 puffs twice a day as you are taking it for now.  Remove to rinse and gargle after using. Please obtain a copy of your insurance inhaler formulary for [place] to review.  This will allow [place] to find the most cost effective medication for you Keep your albuterol available to use either 2 puffs or 1 nebulizer treatment if you needed for shortness of breath, chest tightness, wheezing. Continue fluticasone nasal spray, 2 sprays each nostril once daily.  We will write a prescription for this. Continue loratadine 10 mg once daily. Continue your nasal saline washes as you have been doing them. Stop Zantac for now.  We will start pantoprazole 40 mg once daily.  Try to take this medication 1 hour before eating. Follow with Dr Lamonte Sakai in 12 months, sooner if you have any problems.

## 2019-01-07 NOTE — Assessment & Plan Note (Signed)
Continue Symbicort 2 puffs twice a day as you are taking it for now.  Remove to rinse and gargle after using. Please obtain a copy of your insurance inhaler formulary for [place] to review.  This will allow [place] to find the most cost effective medication for you Keep your albuterol available to use either 2 puffs or 1 nebulizer treatment if you needed for shortness of breath, chest tightness, wheezing. Follow with Dr Lamonte Sakai in 12 months, sooner if you have any problems.

## 2019-01-07 NOTE — Assessment & Plan Note (Signed)
Stop Zantac for now.  We will start pantoprazole 40 mg once daily.  Try to take this medication 1 hour before eating.

## 2019-01-07 NOTE — Progress Notes (Signed)
   Subjective:    Patient ID: Katrina Roman, female    DOB: September 16, 1976, 43 y.o.   MRN: [id number]  Asthma  There is no cough, shortness of breath or wheezing. Pertinent negatives include no ear pain, fever, headaches, postnasal drip, rhinorrhea, sneezing, sore throat or trouble swallowing. Her past medical history is significant for asthma.   ROV 01/07/19 --Katrina Roman is 43, never smoker, with a history of allergic rhinitis, chronic cough and mild intermittent asthma.  She has GERD which also contributes to her chronic cough.  She is managed on Symbicort 80/4.5, uses albuterol rarely. Has nebs and HFA available. Since last time she has done very well since last time. She was treated once in the last year with abx for a sinus infection. She is on loratadine, flonase, NSW's about every other day. She is on zantac - wants to change back to pantoprazole.    Review of Systems  Constitutional: Negative for fever and unexpected weight change.  HENT: Negative for congestion, dental problem, ear pain, nosebleeds, postnasal drip, rhinorrhea, sinus pressure, sneezing, sore throat and trouble swallowing.   Eyes: Negative for redness and itching.  Respiratory: Negative for cough, chest tightness, shortness of breath and wheezing.   Cardiovascular: Negative for palpitations and leg swelling.  Gastrointestinal: Negative for nausea and vomiting.  Genitourinary: Negative for dysuria.  Musculoskeletal: Negative for joint swelling.  Skin: Negative for rash.  Neurological: Negative for headaches.  Hematological: Does not bruise/bleed easily.  Psychiatric/Behavioral: Negative for dysphoric mood. The patient is not nervous/anxious.        Objective:   Physical Exam Vitals:   01/07/19 1624  BP: 116/82  Pulse: 89  SpO2: 97%  Weight: 247 lb (112 kg)  Height: 5' 1"  (1.549 m)   Gen: Pleasant, overwt, in no distress, normal affect  ENT: No lesions, mouth clear, oropharynx clear, some mild nasal congestion, mild  hoarse voice  Neck: No JVD, no stridor Lungs: No use of accessory muscles, clear without rales or rhonchi, no wheeze  Cardiovascular: RRR, heart sounds normal, no murmur or gallops, no peripheral edema  Musculoskeletal: No deformities, no cyanosis or clubbing  Neuro: alert, non focal  Skin: Warm, no lesions or rashes      Assessment & Plan:  Intrinsic asthma Continue Symbicort 2 puffs twice a day as you are taking it for now.  Remove to rinse and gargle after using. Please obtain a copy of your insurance inhaler formulary for [place] to review.  This will allow [place] to find the most cost effective medication for you Keep your albuterol available to use either 2 puffs or 1 nebulizer treatment if you needed for shortness of breath, chest tightness, wheezing. Follow with Dr Lamonte Sakai in 12 months, sooner if you have any problems.  Allergic rhinitis Continue fluticasone nasal spray, 2 sprays each nostril once daily.  We will write a prescription for this. Continue loratadine 10 mg once daily. Continue your nasal saline washes as you have been doing them.  GERD Stop Zantac for now.  We will start pantoprazole 40 mg once daily.  Try to take this medication 1 hour before eating.    Baltazar Apo, MD, PhD 01/07/2019, 4:54 PM [facility] [phone] or if no answer [phone]

## 2019-01-07 NOTE — Assessment & Plan Note (Signed)
Continue fluticasone nasal spray, 2 sprays each nostril once daily.  We will write a prescription for this. Continue loratadine 10 mg once daily. Continue your nasal saline washes as you have been doing them.

## 2019-02-04 NOTE — Telephone Encounter (Signed)
It's not the equivalent to Symbicort. She needs to ask her insurance what ICS/LABA is formulary on her plan

## 2019-02-04 NOTE — Telephone Encounter (Signed)
Patient would like to know if she can  Use Alvesco since it is is more cost affective since the Symbicort    Cost more. She would also like to know if she can change acid reflux medication from Protonix to omeprazole.  Since Dr. Lamonte Sakai is out and patient has been waiting for a response I will send it to [facility] NP.    I have been waiting since Jan 24th for a prescription for the omeprazole that I want to be switched to. I need this to be a 90 day prescription sent to my mail order service.  I have also been waiting since 02/04 to see if Dr Lamonte Sakai is okay with me switching to Alvesco.  I am about out of the samples that he gave me in Jan. If he is okay with switching, I also need this to be 90 days and sent to my mail order service.   I have been more than patient when it comes to waiting for your replies. Please get this handled today.   February 01, 2019  [name], CMA  to Katrina Roman        2:28 PM  Katrina Roman,   Dr. Lamonte Sakai is here today so we should know something soon. He is currently in clinic and usually addresses patient emails at the end. I will get back to you as soon as possible.    Last read by Katrina Roman at 6:47 AM on 02/04/2019.  January 26, 2019  Katrina Roman  to [name], [place]        6:13 AM  Did you hear back from Dr Lamonte Sakai about switching to [facility]?   January 19, 2019        9:40 AM  [name], CMA routed this conversation to Collene Gobble, MD  [name], CMA  to Katrina Roman        9:40 AM  [name],   I will send your message to Dr. Lamonte Sakai about switching your inhaler to Alvesco. As soon as he responds, we will let you know. Have a great day!    Last read by Katrina Roman at 6:46 AM on 02/04/2019.  Katrina Roman  to Collene Gobble, MD        9:20 AM  Dr Lamonte Sakai asked me to find a more cost effective medicine to replace my symbicort. I spoke with my insurance company and they suggested Alvesco. It is 80  mcg if that is okay. The generic of symbicort is not covered by my plan.   The cost for Alvesco is $37/90 days vs $84/90 days for the symbicort.   If it's okay to switch, please send a 90 day supply request to my mail order RX.   Thank you!

## 2019-02-04 NOTE — Telephone Encounter (Signed)
Emailed patient back that we are in need of her formulary from her insurance company  Advised her of EW recommendations below Awaiting formulary at this time.

## 2019-02-05 ENCOUNTER — Telehealth: Payer: Self-pay

## 2019-02-05 NOTE — Telephone Encounter (Signed)
   I have been waiting since Jan 24th for a prescription for the omeprazole that I want to be switched to. I need this to be a 90 day prescription sent to my mail order service.  I have also been waiting since 02/04 to see if Dr Lamonte Sakai is okay with me switching to Alvesco.  I am about out of the samples that he gave me in Jan. If he is okay with switching, I also need this to be 90 days and sent to my mail order service.   I have been more than patient when it comes to waiting for your replies. Please get this handled today.

## 2019-02-05 NOTE — Telephone Encounter (Signed)
This has been taken care of via a MyChart message.

## 2019-02-05 NOTE — Telephone Encounter (Signed)
Per RB >> we can switch Pantoprazole to Omeprazole but this would be 85m BID. If the pt is okay with taking it BID then we can change the prescription. Alvesco does have the same components as Symbicort. If the pt is okay with this then it is okay to proceed with the change.  I have emailed all of this information to the pt.

## 2019-02-08 MED ORDER — OMEPRAZOLE 20 MG PO CPDR: 20.0000 mg | DELAYED_RELEASE_CAPSULE | Freq: Two times a day (BID) | ORAL | 3 refills | Status: DC

## 2019-02-08 MED ORDER — CICLESONIDE 80 MCG/ACT IN AERS: 1.0000 | INHALATION_SPRAY | Freq: Two times a day (BID) | RESPIRATORY_TRACT | 3 refills | Status: DC

## 2019-02-08 NOTE — Addendum Note (Signed)
Addended by: [name] on: 02/08/2019 09:03 AM   Modules accepted: Orders

## 2019-03-01 MED ORDER — PREDNISONE 10 MG PO TABS: ORAL_TABLET | ORAL | 0 refills | Status: DC

## 2019-03-01 MED ORDER — AZITHROMYCIN 250 MG PO TABS: ORAL_TABLET | ORAL | 0 refills | Status: AC

## 2019-03-01 NOTE — Telephone Encounter (Signed)
Called and spoke with Patient.  Patient stated that she is concerned with coronavirus.  Patient stated she is concerned with asthma flare.  Patient stated she is having chest congestion, non productive cough at times, more tickle in her throat. Patient denies any SHOB, or distress.  Patient stated she is checking her temp and is normal.  Patient request Prednisone to be sent to [facility].  Message routed to Dr Lamonte Sakai

## 2019-03-01 NOTE — Telephone Encounter (Signed)
I am Ok with treating as an asthma exacerbation. Please send prednisone and azithro as below. If she develops fever, aches, viral symptoms then she needs to call us back, possibly be seen and screened for flu, COVID, considered for testing   Prednisone > Take 23m daily for 3 days, then 323mdaily for 3 days, then 2046maily for 3 days, then 94m65mily for 3 days, then stop  Azithromycin > Z pack

## 2019-04-01 ENCOUNTER — Telehealth: Payer: 59 | Admitting: Physician Assistant

## 2019-04-01 DIAGNOSIS — M545 Low back pain, unspecified: Secondary | ICD-10-CM

## 2019-04-01 DIAGNOSIS — B9689 Other specified bacterial agents as the cause of diseases classified elsewhere: Secondary | ICD-10-CM | POA: Diagnosis not present

## 2019-04-01 DIAGNOSIS — J019 Acute sinusitis, unspecified: Secondary | ICD-10-CM

## 2019-04-01 MED ORDER — AMOXICILLIN-POT CLAVULANATE 875-125 MG PO TABS: 1.0000 | ORAL_TABLET | Freq: Two times a day (BID) | ORAL | 0 refills | Status: DC

## 2019-04-01 MED ORDER — CYCLOBENZAPRINE HCL 10 MG PO TABS: 10.0000 mg | ORAL_TABLET | Freq: Three times a day (TID) | ORAL | 0 refills | Status: DC | PRN

## 2019-04-01 NOTE — Progress Notes (Signed)
We are sorry that you are not feeling well.  Here is how we plan to help!  For the back symptoms you will need to submit another visit so that the appropriate clinical questions can be answered regarding this. You will only be charged for one e-visit though!  Based on what you have shared with me it looks like you have sinusitis.  Sinusitis is inflammation and infection in the sinus cavities of the head.  Based on your presentation I believe you most likely have Acute Bacterial Sinusitis.  This is an infection caused by bacteria and is treated with antibiotics. I have prescribed Augmentin 883m/125mg one tablet twice daily with food, for 7 days. You may use an oral decongestant such as Mucinex D or if you have glaucoma or high blood pressure use plain Mucinex. Saline nasal spray help and can safely be used as often as needed for congestion.  If you develop worsening sinus pain, fever or notice severe headache and vision changes, or if symptoms are not better after completion of antibiotic, please schedule an appointment with a health care provider.    Sinus infections are not as easily transmitted as other respiratory infection, however we still recommend that you avoid close contact with loved ones, especially the very young and elderly.  Remember to wash your hands thoroughly throughout the day as this is the number one way to prevent the spread of infection!  Home Care:  Only take medications as instructed by your medical team.  Complete the entire course of an antibiotic.  Do not take these medications with alcohol.  A steam or ultrasonic humidifier can help congestion.  You can place a towel over your head and breathe in the steam from hot water coming from a faucet.  Avoid close contacts especially the very young and the elderly.  Cover your mouth when you cough or sneeze.  Always remember to wash your hands.  Get Help Right Away If:  You develop worsening fever or sinus pain.  You  develop a severe head ache or visual changes.  Your symptoms persist after you have completed your treatment plan.  Make sure you  Understand these instructions.  Will watch your condition.  Will get help right away if you are not doing well or get worse.  Your e-visit answers were reviewed by a board certified advanced clinical practitioner to complete your personal care plan.  Depending on the condition, your plan could have included both over the counter or prescription medications.  If there is a problem please reply  once you have received a response from your provider.  Your safety is important to [place]  If you have drug allergies check your prescription carefully.    You can use MyChart to ask questions about today's visit, request a non-urgent call back, or ask for a work or school excuse for 24 hours related to this e-Visit. If it has been greater than 24 hours you will need to follow up with your provider, or enter a new e-Visit to address those concerns.  You will get an e-mail in the next two days asking about your experience.  I hope that your e-visit has been valuable and will speed your recovery. Thank you for using e-visits.

## 2019-04-01 NOTE — Progress Notes (Signed)
I have spent 5 minutes in review of e-visit questionnaire, review and updating patient chart, medical decision making and response to patient.   Kalanie Fewell Cody Raelin Pixler, PA-C    

## 2019-04-01 NOTE — Progress Notes (Signed)

## 2019-04-05 NOTE — Telephone Encounter (Signed)
Received the following message from patient:   "My insurance has recently changed to [facility].It shows that omeprazole and Alvesco are not covered.I also need to find a replacement for the Fluticasone.It shows it will now be $100.I have attached my formulary.I will need new 90 day prescriptions mailed to me so I can send them to my new insurance.Thank you for all you do!"  Per her formulary, the following inhalers are covered: Advair HFA & Diskus, Arnuity, Breo, Flovent Diskus & HFA, Spiriva, Symbicort and Wixela. Per her chart, she has already tried and failed Symbicort 80&160 and Dulera 10-46mg.   Dr. BLamonte Sakai please advise if you would like to switch her to one of the covered medications mentioned above. Thanks!

## 2019-04-06 ENCOUNTER — Other Ambulatory Visit: Payer: Self-pay | Admitting: *Deleted

## 2019-04-06 MED ORDER — FLUTICASONE PROPIONATE HFA 110 MCG/ACT IN AERO: 2.0000 | INHALATION_SPRAY | Freq: Two times a day (BID) | RESPIRATORY_TRACT | 1 refills | Status: DC

## 2019-04-06 NOTE — Telephone Encounter (Signed)
Would switch to Flovent 100 HFA 2 puffs Twice daily

## 2019-04-06 NOTE — Progress Notes (Signed)
Due to insurance formulary change Flovent sent.

## 2019-04-06 NOTE — Telephone Encounter (Signed)
Due to Dr. Lamonte Sakai being at the hospital, sending this to APP of day for them to review.  Tammy, please advise on pt's mychart message and the info from [facility] in regards to pt's meds. Thanks!

## 2019-04-14 ENCOUNTER — Telehealth: Payer: 59 | Admitting: Family

## 2019-04-14 DIAGNOSIS — R0602 Shortness of breath: Secondary | ICD-10-CM

## 2019-04-14 DIAGNOSIS — R062 Wheezing: Secondary | ICD-10-CM

## 2019-04-14 DIAGNOSIS — J4540 Moderate persistent asthma, uncomplicated: Secondary | ICD-10-CM

## 2019-04-14 NOTE — Progress Notes (Signed)
Based on what you shared with me, I feel your condition warrants further evaluation and I recommend that you be seen for a face to face office visit.  Since your symptoms have not improved and have worsen you need to be seen face to face.     NOTE: If you entered your credit card information for this eVisit, you will not be charged. You may see a "hold" on your card for the $35 but that hold will drop off and you will not have a charge processed.  If you are having a true medical emergency please call 911.  If you need an urgent face to face visit, [facility] has four urgent care centers for your convenience.    PLEASE NOTE: THE [facility] LOCATIONS AND URGENT CARE CLINICS DO NOT HAVE THE TESTING FOR CORONAVIRUS COVID19 AVAILABLE.  IF YOU FEEL YOU NEED THIS TEST YOU MUST GO TO A TRIAGE LOCATION AT [facility]   [email or web address] to reserve your spot online an avoid wait times  [facility] [address] [place], [place] [address] Modified hours of operation: Monday-Friday, 10 AM to 6 PM  Saturday & Sunday 10 AM to 4 PM *Across the street from [facility] (New Address[facility] [address], [place], [place] [address] *Just off [address], across the road from [facility] hours of operation: Monday-Friday, 10 AM to 5 PM  Closed Saturday & Sunday   The following sites will take your insurance:  . [facility] [place] a Provider at this Location  [address] [place], [place] [address] . 10 am to 8 pm Monday-Friday . 12 pm to 8 pm Saturday-Sunday   . [facility] at [place] a Provider at this Location  [address] [place], [place], [place] [address] . 8 am to 8 pm Monday-Friday . 9 am to 6 pm Saturday . 11 am to 6 pm Sunday   . [facility] at [place] Get Driving Directions  [address] Suite [place], [place] [address] . 8 am to 8 pm Monday-Friday . 8 am to 4 pm Saturday-Sunday   Your e-visit answers were reviewed by a board certified advanced clinical practitioner to complete your personal care plan.  Thank you for using e-Visits.

## 2019-04-15 ENCOUNTER — Encounter: Payer: Self-pay | Admitting: Adult Health

## 2019-04-15 ENCOUNTER — Other Ambulatory Visit: Payer: Self-pay

## 2019-04-15 ENCOUNTER — Ambulatory Visit (INDEPENDENT_AMBULATORY_CARE_PROVIDER_SITE_OTHER): Payer: 59 | Admitting: Adult Health

## 2019-04-15 ENCOUNTER — Telehealth: Payer: Self-pay | Admitting: Emergency Medicine

## 2019-04-15 DIAGNOSIS — J0191 Acute recurrent sinusitis, unspecified: Secondary | ICD-10-CM

## 2019-04-15 DIAGNOSIS — J4531 Mild persistent asthma with (acute) exacerbation: Secondary | ICD-10-CM | POA: Diagnosis not present

## 2019-04-15 MED ORDER — BUDESONIDE-FORMOTEROL FUMARATE 80-4.5 MCG/ACT IN AERO: 2.0000 | INHALATION_SPRAY | Freq: Two times a day (BID) | RESPIRATORY_TRACT | 5 refills | Status: DC

## 2019-04-15 MED ORDER — AZITHROMYCIN 250 MG PO TABS: ORAL_TABLET | ORAL | 0 refills | Status: AC

## 2019-04-15 MED ORDER — PREDNISONE 10 MG PO TABS: ORAL_TABLET | ORAL | 0 refills | Status: DC

## 2019-04-15 NOTE — Progress Notes (Signed)
Virtual Visit via Telephone Note  I connected with Riley Kill on 04/15/19 at 10:30 AM EDT by telephone and verified that I am speaking with the correct person using two identifiers.  Location: Patient: Home  Provider: Office    I discussed the limitations, risks, security and privacy concerns of performing an evaluation and management service by telephone and the availability of in person appointments. I also discussed with the patient that there may be a patient responsible charge related to this service. The patient expressed understanding and agreed to proceed.   History of Present Illness: Today tele-visit is for an acute visit for cough Patient is a 43 year old female never smoker followed for mild intermittent asthma, allergic rhinitis and chronic cough.  She has underlying GERD. Patient complains of 2 week of nasal congestion sinus pressure, thick nasal discharge with green and yellow mucus.  Has noticed increased wheezing especially at nighttime.  Also has a cough that is minimally productive .   She has been using Mucinex DM and Tylenol Sinus without much relief.  She is requesting antibiotic. She did an E visit on 4/16 with PCP , rx Augmentin x 7 days . Still has sinus congestion is slightly better worse at night . Has sinus pain /pressure . No fever. No travel . Working from home .  No loss of smell. Has poor smell with her chronic sinus disease.  She was changed from Symbicort to Alvesco due to insurance . Recently had new change insurance change . Unclear if now will cover Symbicort .  Appetite is good.  No nausea vomiting or diarrhea.  Denies hemoptysis chest pain orthopnea PND or leg swelling.  No rash.   Patient is on Claritin daily, Flonase daily.and using saline nasal rinses As needed  .    Observations/Objective: Pulmonary function testing August 05, 2014 was normal with no airflow obstruction or restriction.  Positive bronchodilator reversibility, normal  DLCO  Assessment and Plan: Acute asthmatic bronchitis with sinusitis-slow to resolve exacerbation We will try to change back to ICS/LABA combination  Plan  Patient Instructions  Z-Pak take as directed Prednisone taper over the next week Mucinex DM twice daily as needed for cough and congestion Saline nasal rinses as needed Flonase 2 puffs daily Claritin 10 mg daily Change Flovent to Symbicort 80 2 puffs twice daily.  Co-pay card will be sent.  Follow up for televisit in 2 weeks and As needed   Please contact office for sooner follow up if symptoms do not improve or worsen or seek emergency care     Follow Up Instructions: Follow-up in 2 weeks and as needed Please contact office for sooner follow up if symptoms do not improve or worsen or seek emergency care    if not improving will need face to face with chest xray and further evaluation .   I discussed the assessment and treatment plan with the patient. The patient was provided an opportunity to ask questions and all were answered. The patient agreed with the plan and demonstrated an understanding of the instructions.   The patient was advised to call back or seek an in-person evaluation if the symptoms worsen or if the condition fails to improve as anticipated.  I provided 24 minutes of non-face-to-face time during this encounter.   Rexene Edison, NP

## 2019-04-15 NOTE — Telephone Encounter (Signed)
Need televisit , I can complete it this today .

## 2019-04-15 NOTE — Telephone Encounter (Signed)
Primary Pulmonologist: [place] Last office visit and with whom: 01/07/19 with Dr. Lamonte Sakai What do we see them for (pulmonary problems): Asthma  Reason for call: sinus infection and non productive cough.  In the last month, have you been in contact with someone who was confirmed or suspected to have [place]avirus / COVID-19?  No and the patient has not traveled.  Do you have any of the following symptoms developed in the last 30 days? Fever: none at all Cough: yes since Monday/Tuesday but it is non productive Shortness of breath: no, but does have wheezing at night due to sinus drainage.  When did your symptoms start?  Sinus infection started a week ago.  If the patient has a fever, what is the last reading?  (use n/a if patient denies fever)  n/a . IF THE PATIENT STATES THEY DO NOT OWN A THERMOMETER, THEY MUST GO AND PURCHASE ONE When did the fever start?: n/a Have you taken any medication to suppress a fever (ie Ibuprofen, Aleve, Tylenol)?: n/a  Patient stated the nasal congestion is yellow and green and chunky. The wheezing is happening at night when she lays down to sleep. When she coughs she said nothing is coming up out of her chest at all.  Patient stated she has already tried Mucinex DM and Tylenol Sinus, but it has not helped.   Above message sent to app of the day Rexene Edison, NP for review and response.   Tammy, can you please review and advise if anything can be sent to pharmacy for her. Thank you.

## 2019-04-15 NOTE — Telephone Encounter (Signed)
Called and spoke with pt stating to her that TP wanted [place] to schedule televisit with her. Pt expressed understanding. televisit has been scheduled for pt with TP today at 10:30. Nothing further needed.

## 2019-04-15 NOTE — Addendum Note (Signed)
Addended by: Parke Poisson E on: 04/15/2019 11:27 AM   Modules accepted: Orders

## 2019-04-15 NOTE — Patient Instructions (Signed)
Z-Pak take as directed Prednisone taper over the next week Mucinex DM twice daily as needed for cough and congestion Saline nasal rinses as needed Flonase 2 puffs daily Claritin 10 mg daily Change Flovent to Symbicort 80 2 puffs twice daily.  Co-pay card will be sent.  Follow up for televisit in 2 weeks and As needed   Please contact office for sooner follow up if symptoms do not improve or worsen or seek emergency care

## 2019-04-19 ENCOUNTER — Other Ambulatory Visit: Payer: Self-pay | Admitting: *Deleted

## 2019-04-19 MED ORDER — ALBUTEROL SULFATE HFA 108 (90 BASE) MCG/ACT IN AERS: 2.0000 | INHALATION_SPRAY | Freq: Four times a day (QID) | RESPIRATORY_TRACT | 1 refills | Status: DC | PRN

## 2019-04-29 ENCOUNTER — Encounter: Payer: Self-pay | Admitting: Adult Health

## 2019-04-29 ENCOUNTER — Ambulatory Visit (INDEPENDENT_AMBULATORY_CARE_PROVIDER_SITE_OTHER): Payer: 59 | Admitting: Adult Health

## 2019-04-29 ENCOUNTER — Other Ambulatory Visit: Payer: Self-pay

## 2019-04-29 DIAGNOSIS — J4531 Mild persistent asthma with (acute) exacerbation: Secondary | ICD-10-CM | POA: Diagnosis not present

## 2019-04-29 DIAGNOSIS — J310 Chronic rhinitis: Secondary | ICD-10-CM | POA: Diagnosis not present

## 2019-04-29 NOTE — Patient Instructions (Addendum)
Mucinex DM twice daily as needed for cough and congestion Saline nasal rinses as needed Flonase 2 puffs daily Claritin 10 mg daily Continue on Symbicort 80 2 puffs twice daily.  Co-pay card will be sent.  Follow up with Dr. Lamonte Sakai  In 3 months and As needed   Please contact office for sooner follow up if symptoms do not improve or worsen or seek emergency care

## 2019-04-29 NOTE — Progress Notes (Signed)
Virtual Visit via Telephone Note  I connected with Katrina Roman on 04/29/19 at  9:00 AM EDT by telephone and verified that I am speaking with the correct person using two identifiers.  Location: Patient: Home  Provider: Office    I discussed the limitations, risks, security and privacy concerns of performing an evaluation and management service by telephone and the availability of in person appointments. I also discussed with the patient that there may be a patient responsible charge related to this service. The patient expressed understanding and agreed to proceed.   History of Present Illness: 43 year old female never smoker followed for mild intermittent asthma, allergic rhinitis and chronic cough. Medical history significant for GERD. Today's tele-visit is for a follow-up of recent asthmatic bronchitic exacerbation.  Patient had a slow to resolve bronchitic exacerbation with suspected underlying sinusitis.  She was initially seen by her PCP on April 01, 2019 she was prescribed Augmentin for 7 days.  Patient continued to have ongoing symptoms with sinus congestion and cough.  At last visit patient was started on a Z-Pak.  Given a prednisone taper.  And changed from Flovent to Symbicort. Since last visit patient says she is much improved.  Her cough and wheezing have resolved.  Breathing is much better.  Sinus congestion and pressure are improved.  She does have some lingering nasal congestion that is white mucus.  Has a little bit of intermittent sinus pressure and ear pressure.  She is taking Flonase and Claritin.  She denies any fever, chest pain, fever, orthopnea.    Observations/Objective: Pulmonary function testing August 05, 2014 was normal with no airflow obstruction or restriction.  Positive bronchodilator reversibility, normal DLCO  Assessment and Plan: Recent asthmatic bronchitic exacerbation with sinusitis.-Much improved.  Mild persistent asthma.  Significant improvement on  Symbicort.  Chronic rhinitis.-Continue to control for triggers.  Plan Patient Instructions  Mucinex DM twice daily as needed for cough and congestion Saline nasal rinses as needed Flonase 2 puffs daily Claritin 10 mg daily Continue on Symbicort 80 2 puffs twice daily.  Co-pay card will be sent.  Follow up with Dr. Lamonte Sakai  In 3 months and As needed   Please contact office for sooner follow up if symptoms do not improve or worsen or seek emergency care       Follow Up Instructions: Follow-up with Dr. Lamonte Sakai in 3 months and as needed   I discussed the assessment and treatment plan with the patient. The patient was provided an opportunity to ask questions and all were answered. The patient agreed with the plan and demonstrated an understanding of the instructions.   The patient was advised to call back or seek an in-person evaluation if the symptoms worsen or if the condition fails to improve as anticipated.  I provided 22 minutes of non-face-to-face time during this encounter.   Rexene Edison, NP

## 2019-05-26 ENCOUNTER — Telehealth: Payer: 59 | Admitting: Nurse Practitioner

## 2019-05-26 DIAGNOSIS — J329 Chronic sinusitis, unspecified: Secondary | ICD-10-CM | POA: Diagnosis not present

## 2019-05-26 DIAGNOSIS — B9789 Other viral agents as the cause of diseases classified elsewhere: Secondary | ICD-10-CM | POA: Diagnosis not present

## 2019-05-26 NOTE — Progress Notes (Signed)
We are sorry that you are not feeling well.  Here is how we plan to help!  Based on what you have shared with me it looks like you have sinusitis.  Sinusitis is inflammation and infection in the sinus cavities of the head.  Based on your presentation I believe you most likely have Acute Viral Sinusitis.This is an infection most likely caused by a virus. There is not specific treatment for viral sinusitis other than to help you with the symptoms until the infection runs its course.  You may use an oral decongestant such as Mucinex D or if you have glaucoma or high blood pressure use plain Mucinex. Saline nasal spray help and can safely be used as often as needed for congestion. Use Fluticasone nasal spray two sprays in each nostril once a day as previously prescribed  Some authorities believe that zinc sprays or the use of Echinacea may shorten the course of your symptoms.  Sinus infections are not as easily transmitted as other respiratory infection, however we still recommend that you avoid close contact with loved ones, especially the very young and elderly.  Remember to wash your hands thoroughly throughout the day as this is the number one way to prevent the spread of infection!  Home Care:  Only take medications as instructed by your medical team.  Do not take these medications with alcohol.  A steam or ultrasonic humidifier can help congestion.  You can place a towel over your head and breathe in the steam from hot water coming from a faucet.  Avoid close contacts especially the very young and the elderly.  Cover your mouth when you cough or sneeze.  Always remember to wash your hands.  Get Help Right Away If:  You develop worsening fever or sinus pain.  You develop a severe head ache or visual changes.  Your symptoms persist after you have completed your treatment plan.  Make sure you  Understand these instructions.  Will watch your condition.  Will get help right away if  you are not doing well or get worse.  Your e-visit answers were reviewed by a board certified advanced clinical practitioner to complete your personal care plan.  Depending on the condition, your plan could have included both over the counter or prescription medications.  If there is a problem please reply  once you have received a response from your provider.  Your safety is important to [place].  If you have drug allergies check your prescription carefully.    You can use MyChart to ask questions about today's visit, request a non-urgent call back, or ask for a work or school excuse for 24 hours related to this e-Visit. If it has been greater than 24 hours you will need to follow up with your provider, or enter a new e-Visit to address those concerns.  You will get an e-mail in the next two days asking about your experience.  I hope that your e-visit has been valuable and will speed your recovery. Thank you for using e-visits.  5-10 minutes spent reviewing and documenting in chart.

## 2019-06-03 ENCOUNTER — Telehealth: Payer: 59 | Admitting: Family

## 2019-06-03 DIAGNOSIS — B9689 Other specified bacterial agents as the cause of diseases classified elsewhere: Secondary | ICD-10-CM

## 2019-06-03 DIAGNOSIS — J329 Chronic sinusitis, unspecified: Secondary | ICD-10-CM

## 2019-06-03 MED ORDER — DOXYCYCLINE HYCLATE 100 MG PO TABS: 100.0000 mg | ORAL_TABLET | Freq: Two times a day (BID) | ORAL | 0 refills | Status: DC

## 2019-06-03 NOTE — Progress Notes (Signed)
Greater than 5 minutes, yet less than 10 minutes of time have been spent researching, coordinating, and implementing care for this patient today.  Thank you for the details you included in the comment boxes. Those details are very helpful in determining the best course of treatment for you and help [place] to provide the best care.  We are sorry that you are not feeling well.  Here is how we plan to help!  Based on what you have shared with me it looks like you have sinusitis.  Sinusitis is inflammation and infection in the sinus cavities of the head.  Based on your presentation I believe you most likely have Acute Bacterial Sinusitis.  This is an infection caused by bacteria and is treated with antibiotics. I have prescribed Doxycycline 119m by mouth twice a day for 10 days. You may use an oral decongestant such as Mucinex D or if you have glaucoma or high blood pressure use plain Mucinex. Saline nasal spray help and can safely be used as often as needed for congestion.  If you develop worsening sinus pain, fever or notice severe headache and vision changes, or if symptoms are not better after completion of antibiotic, please schedule an appointment with a health care provider.    Sinus infections are not as easily transmitted as other respiratory infection, however we still recommend that you avoid close contact with loved ones, especially the very young and elderly.  Remember to wash your hands thoroughly throughout the day as this is the number one way to prevent the spread of infection!  Home Care:  Only take medications as instructed by your medical team.  Complete the entire course of an antibiotic.  Do not take these medications with alcohol.  A steam or ultrasonic humidifier can help congestion.  You can place a towel over your head and breathe in the steam from hot water coming from a faucet.  Avoid close contacts especially the very young and the elderly.  Cover your mouth when you cough  or sneeze.  Always remember to wash your hands.  Get Help Right Away If:  You develop worsening fever or sinus pain.  You develop a severe head ache or visual changes.  Your symptoms persist after you have completed your treatment plan.  Make sure you  Understand these instructions.  Will watch your condition.  Will get help right away if you are not doing well or get worse.  Your e-visit answers were reviewed by a board certified advanced clinical practitioner to complete your personal care plan.  Depending on the condition, your plan could have included both over the counter or prescription medications.  If there is a problem please reply  once you have received a response from your provider.  Your safety is important to [place]  If you have drug allergies check your prescription carefully.    You can use MyChart to ask questions about today's visit, request a non-urgent call back, or ask for a work or school excuse for 24 hours related to this e-Visit. If it has been greater than 24 hours you will need to follow up with your provider, or enter a new e-Visit to address those concerns.  You will get an e-mail in the next two days asking about your experience.  I hope that your e-visit has been valuable and will speed your recovery. Thank you for using e-visits.

## 2019-06-11 ENCOUNTER — Telehealth: Payer: 59 | Admitting: Family

## 2019-06-11 DIAGNOSIS — J309 Allergic rhinitis, unspecified: Secondary | ICD-10-CM

## 2019-06-11 NOTE — Progress Notes (Signed)
Greater than 5 minutes, yet less than 10 minutes of time have been spent researching, coordinating, and implementing care for this patient today.  Thank you for the details you included in the comment boxes. Those details are very helpful in determining the best course of treatment for you and help [place] to provide the best care.  With the shortness of breath and wheezing, this is more consistent with a bacterial URI rather than allergies alone. I know you are on the antibiotics I gave you a wek ago, ending in a couple days, so the allergies could certainly be irritating your already-inflamed airways. Will add allergy treatment options below.  --------------------------------------------  E visit for Allergic Rhinitis We are sorry that you are not feeling well.  Here is how we plan to help!  Based on what you have shared with me it looks like you have Allergic Rhinitis.  Rhinitis is when a reaction occurs that causes nasal congestion, runny nose, sneezing, and itching.  Most types of rhinitis are caused by an inflammation and are associated with symptoms in the eyes ears or throat. There are several types of rhinitis.  The most common are acute rhinitis, which is usually caused by a viral illness, allergic or seasonal rhinitis, and nonallergic or year-round rhinitis.  Nasal allergies occur certain times of the year.  Allergic rhinitis is caused when allergens in the air trigger the release of histamine in the body.  Histamine causes itching, swelling, and fluid to build up in the fragile linings of the nasal passages, sinuses and eyelids.  An itchy nose and clear discharge are common.  I recommend the following over the counter treatments: You should take a daily dose of antihistamine and Xyzal 5 mg take 1 tablet daily  I also would recommend a nasal spray: Flonase 2 sprays into each nostril once daily  You may also benefit from eye drops such as: Systane 1-2 driops each eye twice daily as  needed  HOME CARE:   You can use an over-the-counter saline nasal spray as needed  Avoid areas where there is heavy dust, mites, or molds  Stay indoors on windy days during the pollen season  Keep windows closed in home, at least in bedroom; use air conditioner.  Use high-efficiency house air filter  Keep windows closed in car, turn AC on re-circulate  Avoid playing out with dog during pollen season  GET HELP RIGHT AWAY IF:   If your symptoms do not improve within 10 days  You become short of breath  You develop yellow or green discharge from your nose for over 3 days  You have coughing fits  MAKE SURE YOU:   Understand these instructions  Will watch your condition  Will get help right away if you are not doing well or get worse  Thank you for choosing an e-visit. Your e-visit answers were reviewed by a board certified advanced clinical practitioner to complete your personal care plan. Depending upon the condition, your plan could have included both over the counter or prescription medications. Please review your pharmacy choice. Be sure that the pharmacy you have chosen is open so that you can pick up your prescription now.  If there is a problem you may message your provider in [place] to have the prescription routed to another pharmacy. Your safety is important to [place]. If you have drug allergies check your prescription carefully.  For the next 24 hours, you can use MyChart to ask questions about today's visit, request  a non-urgent call back, or ask for a work or school excuse from your e-visit provider. You will get an email in the next two days asking about your experience. I hope that your e-visit has been valuable and will speed your recovery.

## 2019-06-21 ENCOUNTER — Other Ambulatory Visit: Payer: Self-pay | Admitting: *Deleted

## 2019-06-21 MED ORDER — ALBUTEROL SULFATE (2.5 MG/3ML) 0.083% IN NEBU: 2.5000 mg | INHALATION_SOLUTION | Freq: Four times a day (QID) | RESPIRATORY_TRACT | 1 refills | Status: DC | PRN

## 2019-07-07 ENCOUNTER — Other Ambulatory Visit: Payer: Self-pay

## 2019-07-07 MED ORDER — ALBUTEROL SULFATE HFA 108 (90 BASE) MCG/ACT IN AERS: 2.0000 | INHALATION_SPRAY | Freq: Four times a day (QID) | RESPIRATORY_TRACT | 3 refills | Status: DC | PRN

## 2019-07-19 NOTE — Telephone Encounter (Signed)
Attempted to call patient regarding the Albuterol HFA.  No answer.  Left voicemail to call back.  Will also respond on mychart email.

## 2019-07-19 NOTE — Telephone Encounter (Signed)
[facility] regarding the patient's concern for not receiving the 'albuterol' only HFA inhaler.  Explained to pharmacy tech, Loma Sousa, that patient states her usual inhaler was gray and she receive a white one and feels it is not the same as the gray one she received prior.  Pharmacy had several questions for the patient, acknowledging the pharmacy did receive our notification of 'albuterol' only dispensing.  Patient had been made aware of various types of albuterol inhalers in previous conversations with this office but still had concerns.  Will notify patient of pharmacy's request for her to call them directly in order to work through the patient's concern.  Nothing further needed.

## 2019-08-03 ENCOUNTER — Encounter: Payer: Self-pay | Admitting: Emergency Medicine

## 2019-08-03 ENCOUNTER — Other Ambulatory Visit: Payer: Self-pay

## 2019-08-03 ENCOUNTER — Ambulatory Visit (INDEPENDENT_AMBULATORY_CARE_PROVIDER_SITE_OTHER): Payer: 59 | Admitting: Emergency Medicine

## 2019-08-03 DIAGNOSIS — K219 Gastro-esophageal reflux disease without esophagitis: Secondary | ICD-10-CM

## 2019-08-03 DIAGNOSIS — J309 Allergic rhinitis, unspecified: Secondary | ICD-10-CM

## 2019-08-03 DIAGNOSIS — J45909 Unspecified asthma, uncomplicated: Secondary | ICD-10-CM

## 2019-08-03 MED ORDER — OMEPRAZOLE 20 MG PO CPDR: 20.0000 mg | DELAYED_RELEASE_CAPSULE | Freq: Every day | ORAL | 3 refills | Status: DC

## 2019-08-03 NOTE — Progress Notes (Signed)
Subjective:    Patient ID: Katrina Roman, female    DOB: [birth year], 43 y.o.   MRN: [id number]  Asthma There is no cough, shortness of breath or wheezing. Pertinent negatives include no ear pain, fever, headaches, postnasal drip, rhinorrhea, sneezing, sore throat or trouble swallowing. Her past medical history is significant for asthma.   ROV 01/07/19 --Katrina Roman is 43, never smoker, with a history of allergic rhinitis, chronic cough and mild intermittent asthma.  She has GERD which also contributes to her chronic cough.  She is managed on Symbicort 80/4.5, uses albuterol rarely. Has nebs and HFA available. Since last time she has done very well since last time. She was treated once in the last year with abx for a sinus infection. She is on loratadine, flonase, [place]'s about every other day. She is on zantac - wants to change back to pantoprazole.   ROV 08/03/2019 --follow-up visit for 43 year old woman with allergic rhinitis and mild intermittent asthma.  She also has GERD and associated chronic cough.  We have been managing her on Symbicort 80/4.5.  Her allergy regimen is loratadine + xyzal, flonase prn. Saline rinses daily.  Treats her GERD with omeprazole qd. Exercise tolerance is good. Never needs albuterol - just got approved for ventolin via [place]. She has never had PNA shot.    Review of Systems  Constitutional: Negative for fever and unexpected weight change.  HENT: Negative for congestion, dental problem, ear pain, nosebleeds, postnasal drip, rhinorrhea, sinus pressure, sneezing, sore throat and trouble swallowing.   Eyes: Negative for redness and itching.  Respiratory: Negative for cough, chest tightness, shortness of breath and wheezing.   Cardiovascular: Negative for palpitations and leg swelling.  Gastrointestinal: Negative for nausea and vomiting.  Genitourinary: Negative for dysuria.  Musculoskeletal: Negative for joint swelling.  Skin: Negative for rash.  Neurological: Negative for  headaches.  Hematological: Does not bruise/bleed easily.  Psychiatric/Behavioral: Negative for dysphoric mood. The patient is not nervous/anxious.        Objective:   Physical Exam Vitals:   08/03/19 1039  BP: 122/70  Pulse: 85  SpO2: 98%  Weight: 248 lb (112.5 kg)  Height: 5' 1"  (1.549 m)   Gen: Pleasant, overwt woman, in no distress,  normal affect  ENT: No lesions,  mouth clear,  oropharynx clear, no postnasal drip  Neck: No JVD, no stridor  Lungs: No use of accessory muscles, no crackles or wheezing on normal respiration, no wheeze on forced expiration  Cardiovascular: RRR, heart sounds normal, no murmur or gallops, no peripheral edema  Musculoskeletal: No deformities, no cyanosis or clubbing  Neuro: alert, awake, non focal  Skin: Warm, no lesions or rash       Assessment & Plan:  Intrinsic asthma Please continue Symbicort 2 puffs twice daily.  Remember to rinse and gargle after using this medication. Keep your albuterol (Ventolin) available to use 2 puffs if needed for shortness of breath, chest tightness, wheezing.   Allergic rhinitis A contributor to her asthma, fairly well-controlled currently.  She does still have periods of congestion and difficulty moving air through her nose.  We talked today about possibly increasing her nasal steroid to daily on a schedule.  She will experiment with this.  Continue your nasal saline rinses once daily. Continue loratadine and Xyzal as you have been taking them. Continue your Flonase 2 sprays each nostril once daily if needed for allergy symptoms.  You could consider changing this to every day on a schedule to  see if you get better control of your congestion.  The maximum dose would be 2 sprays each nostril twice a day.  GERD She is using omeprazole once daily on a schedule, benefiting from this.   Baltazar Apo, MD, PhD 08/03/2019, 11:15 AM [facility] [phone] or if no answer [phone]

## 2019-08-03 NOTE — Assessment & Plan Note (Signed)
A contributor to her asthma, fairly well-controlled currently.  She does still have periods of congestion and difficulty moving air through her nose.  We talked today about possibly increasing her nasal steroid to daily on a schedule.  She will experiment with this.  Continue your nasal saline rinses once daily. Continue loratadine and Xyzal as you have been taking them. Continue your Flonase 2 sprays each nostril once daily if needed for allergy symptoms.  You could consider changing this to every day on a schedule to see if you get better control of your congestion.  The maximum dose would be 2 sprays each nostril twice a day.

## 2019-08-03 NOTE — Assessment & Plan Note (Signed)
Please continue Symbicort 2 puffs twice daily.  Remember to rinse and gargle after using this medication. Keep your albuterol (Ventolin) available to use 2 puffs if needed for shortness of breath, chest tightness, wheezing.

## 2019-08-03 NOTE — Patient Instructions (Addendum)
Please continue Symbicort 2 puffs twice daily.  Remember to rinse and gargle after using this medication. Keep your albuterol (Ventolin) available to use 2 puffs if needed for shortness of breath, chest tightness, wheezing. Continue your nasal saline rinses once daily. Continue loratadine and Xyzal as you have been taking them. Continue your Flonase 2 sprays each nostril once daily if needed for allergy symptoms.  You could consider changing this to every day on a schedule to see if you get better control of your congestion.  The maximum dose would be 2 sprays each nostril twice a day. Please continue omeprazole once daily as you have been taking it. Get the flu shot in the fall. You are due for the pneumonia shot.  Let us know when you would like to get it and we will arrange Follow with Katrina Roman in 12 months or sooner if you have any problems.

## 2019-08-03 NOTE — Assessment & Plan Note (Signed)
She is using omeprazole once daily on a schedule, benefiting from this.

## 2019-08-06 ENCOUNTER — Telehealth: Payer: 59 | Admitting: Physician Assistant

## 2019-08-06 DIAGNOSIS — J988 Other specified respiratory disorders: Secondary | ICD-10-CM

## 2019-08-06 MED ORDER — PREDNISONE 20 MG PO TABS: 40.0000 mg | ORAL_TABLET | Freq: Every day | ORAL | 0 refills | Status: AC

## 2019-08-06 NOTE — Progress Notes (Addendum)
We are sorry you are not feeling well.  Here is how we plan to help!  Based on what you have shared with me, it looks like you may have a viral upper respiratory infection.  Upper respiratory infections are caused by a large number of viruses; however, rhinovirus is the most common cause. Azithromycin is used for bacterial infections and based on your timeframe of symptoms it is less likely to be bacterial in nature. If you feel that you are developing a bacterial infection you should seek in person evaluation.   Symptoms vary from person to person, with common symptoms including sore throat, cough, and fatigue or lack of energy.  A low-grade fever of up to 100.4 may present, but is often uncommon.  Symptoms vary however, and are closely related to a person's age or underlying illnesses.  The most common symptoms associated with an upper respiratory infection are nasal discharge or congestion, cough, sneezing, headache and pressure in the ears and face.  These symptoms usually persist for about 3 to 10 days, but can last up to 2 weeks.  It is important to know that upper respiratory infections do not cause serious illness or complications in most cases.    Upper respiratory infections can be transmitted from person to person, with the most common method of transmission being a person's hands.  The virus is able to live on the skin and can infect other persons for up to 2 hours after direct contact.  Also, these can be transmitted when someone coughs or sneezes; thus, it is important to cover the mouth to reduce this risk.  To keep the spread of the illness at [place], good hand hygiene is very important.  This is an infection that is most likely caused by a virus. There are no specific treatments other than to help you with the symptoms until the infection runs its course.  We are sorry you are not feeling well.  Here is how we plan to help!   For nasal congestion, you may use an oral decongestants such as  Mucinex D or if you have glaucoma or high blood pressure use plain Mucinex.  Saline nasal spray or nasal drops can help and can safely be used as often as needed for congestion.   If you do not have a history of heart disease, hypertension, diabetes or thyroid disease, prostate/bladder issues or glaucoma, you may also use Sudafed to treat nasal congestion.  It is highly recommended that you consult with a pharmacist or your primary care physician to ensure this medication is safe for you to take.     If you have a cough, you may use cough suppressants such as Delsym and Robitussin.  If you have glaucoma or high blood pressure, you can also use Coricidin HBP.    Because of your history of asthma and wheezing I do think it is ok to use prednisone. It appears that you are allergic to cortisone but have taken prednisone previously.   If you have a sore or scratchy throat, use a saltwater gargle-  to  teaspoon of salt dissolved in a 4-ounce to 8-ounce glass of warm water.  Gargle the solution for approximately 15-30 seconds and then spit.  It is important not to swallow the solution.  You can also use throat lozenges/cough drops and Chloraseptic spray to help with throat pain or discomfort.  Warm or cold liquids can also be helpful in relieving throat pain.  For headache, pain or general  discomfort, you can use Ibuprofen or Tylenol as directed.   Some authorities believe that zinc sprays or the use of Echinacea may shorten the course of your symptoms.   HOME CARE . Only take medications as instructed by your medical team. . Be sure to drink plenty of fluids. Water is fine as well as fruit juices, sodas and electrolyte beverages. You may want to stay away from caffeine or alcohol. If you are nauseated, try taking small sips of liquids. How do you know if you are getting enough fluid? Your urine should be a pale yellow or almost colorless. . Get rest. . Taking a steamy shower or using a humidifier may  help nasal congestion and ease sore throat pain. You can place a towel over your head and breathe in the steam from hot water coming from a faucet. . Using a saline nasal spray works much the same way. . Cough drops, hard candies and sore throat lozenges may ease your cough. . Avoid close contacts especially the very young and the elderly . Cover your mouth if you cough or sneeze . Always remember to wash your hands.   GET HELP RIGHT AWAY IF: . You develop worsening fever. . If your symptoms do not improve within 10 days . You develop yellow or green discharge from your nose over 3 days. . You have coughing fits . You develop a severe head ache or visual changes. . You develop shortness of breath, difficulty breathing or start having chest pain . Your symptoms persist after you have completed your treatment plan  MAKE SURE YOU   Understand these instructions.  Will watch your condition.  Will get help right away if you are not doing well or get worse.  Your e-visit answers were reviewed by a board certified advanced clinical practitioner to complete your personal care plan. Depending upon the condition, your plan could have included both over the counter or prescription medications. Please review your pharmacy choice. If there is a problem, you may call our nursing hot line at and have the prescription routed to another pharmacy. Your safety is important to [place]. If you have drug allergies check your prescription carefully.   You can use MyChart to ask questions about today's visit, request a non-urgent call back, or ask for a work or school excuse for 24 hours related to this e-Visit. If it has been greater than 24 hours you will need to follow up with your provider, or enter a new e-Visit to address those concerns. You will get an e-mail in the next two days asking about your experience.  I hope that your e-visit has been valuable and will speed your recovery. Thank you for using  e-visits.  Greater than 5 minutes, yet less than 10 minutes of time have been spent researching, coordinating, and implementing care for this patient today

## 2019-09-01 ENCOUNTER — Ambulatory Visit (INDEPENDENT_AMBULATORY_CARE_PROVIDER_SITE_OTHER): Payer: 59 | Admitting: Pulmonary Disease

## 2019-09-01 ENCOUNTER — Other Ambulatory Visit: Payer: Self-pay

## 2019-09-01 ENCOUNTER — Encounter: Payer: Self-pay | Admitting: Pulmonary Disease

## 2019-09-01 DIAGNOSIS — J45909 Unspecified asthma, uncomplicated: Secondary | ICD-10-CM | POA: Diagnosis not present

## 2019-09-01 DIAGNOSIS — K219 Gastro-esophageal reflux disease without esophagitis: Secondary | ICD-10-CM

## 2019-09-01 DIAGNOSIS — J309 Allergic rhinitis, unspecified: Secondary | ICD-10-CM | POA: Diagnosis not present

## 2019-09-01 DIAGNOSIS — D721 Eosinophilia, unspecified: Secondary | ICD-10-CM

## 2019-09-01 MED ORDER — PREDNISONE 10 MG PO TABS: ORAL_TABLET | ORAL | 0 refills | Status: DC

## 2019-09-01 NOTE — Assessment & Plan Note (Signed)
Plan: Continue omeprazole

## 2019-09-01 NOTE — Patient Instructions (Signed)
You were seen today by Lauraine Rinne, NP  for:   1. Intrinsic asthma  Continue Symbicort 80 >>> 2 puffs in the morning right when you wake up, rinse out your mouth after use, 12 hours later 2 puffs, rinse after use >>> Take this daily, no matter what >>> This is not a rescue inhaler   Only use your albuterol as a rescue medication to be used if you can't catch your breath by resting or doing a relaxed purse lip breathing pattern.  - The less you use it, the better it will work when you need it. - Ok to use up to 2 puffs  every 4 hours if you must but call for immediate appointment if use goes up over your usual need - Don't leave home without it !!  (think of it like the spare tire for your car)   - predniSONE (DELTASONE) 10 MG tablet; 4 tabs for 2 days, then 3 tabs for 2 days, 2 tabs for 2 days, then 1 tab for 2 days, then stop  Dispense: 20 tablet; Refill: 0  2. Eosinophilia  Previously seen on your blood work  If you have another asthma flare requiring steroids may need to repeat the blood work as well as have an office visit  3. Allergic rhinitis, unspecified seasonality, unspecified trigger  Continue daily antihistamine Encourage nasal saline rinses especially in summer and fall times as you have high triggers for that  4. Gastroesophageal reflux disease without esophagitis  Omeprazole 20 mg tablet  >>>Please take 1 tablet daily 15 minutes to 30 minutes before your first meal of the day as well as before your other medications >>>Try to take at the same time each day >>>take this medication daily  GERD management: >>>Avoid laying flat until 2 hours after meals >>>Elevate head of the bed including entire chest >>>Reduce size of meals and amount of fat, acid, spices, caffeine and sweets >>>If you are smoking, Please stop! >>>Decrease alcohol consumption >>>Work on maintaining a healthy weight with normal BMI      We recommend today:  No orders of the defined types were  placed in this encounter.  No orders of the defined types were placed in this encounter.  Meds ordered this encounter  Medications  . predniSONE (DELTASONE) 10 MG tablet    Sig: 4 tabs for 2 days, then 3 tabs for 2 days, 2 tabs for 2 days, then 1 tab for 2 days, then stop    Dispense:  20 tablet    Refill:  0    Follow Up:    No follow-ups on file.   Please do your part to reduce the spread of COVID-19:      Reduce your risk of any infection  and COVID19 by using the similar precautions used for avoiding the common cold or flu:  Marland Kitchen Wash your hands often with soap and warm water for at least 20 seconds.  If soap and water are not readily available, use an alcohol-based hand sanitizer with at least 60% alcohol.  . If coughing or sneezing, cover your mouth and nose by coughing or sneezing into the elbow areas of your shirt or coat, into a tissue or into your sleeve (not your hands). Langley Gauss A MASK when in public  . Avoid shaking hands with others and consider head nods or verbal greetings only. . Avoid touching your eyes, nose, or mouth with unwashed hands.  . Avoid close contact with people who are  sick. . Avoid places or events with large numbers of people in one location, like concerts or sporting events. . If you have some symptoms but not all symptoms, continue to monitor at home and seek medical attention if your symptoms worsen. . If you are having a medical emergency, call 911.   [place] / e-Visit: [email or web address]         [facility]: [facility]: [phone]                   [facility]: [phone]     It is flu season:   >>> Best ways to protect herself from the flu: Receive the yearly flu vaccine, practice good hand hygiene washing with soap and also using hand sanitizer when available, eat a nutritious  meals, get adequate rest, hydrate appropriately   Please contact the office if your symptoms worsen or you have concerns that you are not improving.   Thank you for choosing [facility] for your healthcare, and for allowing [place] to partner with you on your healthcare journey. I am thankful to be able to provide care to you today.   Wyn Quaker FNP-C

## 2019-09-01 NOTE — Assessment & Plan Note (Signed)
Previously seen on last blood work in 2015 May need to consider repeat blood work if patient has recurrent flares If patient continues to have recurrent flares likely will need higher dose ICS

## 2019-09-01 NOTE — Assessment & Plan Note (Signed)
Plan: Continue daily antihistamine Start nasal saline rinses 1-2 times daily especially in fall in summer months to help with asthma symptoms

## 2019-09-01 NOTE — Assessment & Plan Note (Signed)
Plan: Prednisone taper today Believe we can hold off on antibiotics as patient has a nonproductive dry cough as well as having no fatigue or systemic symptoms Patient knows to contact our office if the symptoms develop and then we can consider antibiotic therapy such as azithromycin Continue Symbicort 80 Continue albuterol rescue inhaler or nebulized treatments every 6 hours as needed for shortness of breath or wheezing If patient is not improving despite these interventions she needs to contact our office and likely will need an in person evaluation If patient requires an additional steroid taper within the next 12 months patient should consider having additional blood work (repeat IgE, repeat CBC with differential) done to confirm peripheral eosinophilia as well as increase Symbicort to 160 dose Continue daily antihistamine Would recommend nasal saline rinses and summer and fall months as patient has recurrent exacerbations at this time to help with removing triggers

## 2019-09-01 NOTE — Progress Notes (Signed)
Virtual Visit via Telephone Note  I connected with Katrina Roman on 09/01/19 at 11:15 AM EDT by telephone and verified that I am speaking with the correct person using two identifiers.  Location: Patient: Home Provider: [facility] - [address], [place], [place], [place] [address]   I discussed the limitations, risks, security and privacy concerns of performing an evaluation and management service by telephone and the availability of in person appointments. I also discussed with the patient that there may be a patient responsible charge related to this service. The patient expressed understanding and agreed to proceed.  Patient consented to consult via telephone: Yes People present and their role in pt care: Pt    History of Present Illness:  43 year old female never smoker followed in our office for intrinsic asthma  Past medical history: GERD Smoking history: Never smoker Maintenance: Symbicort 80 Patient of Dr. Lamonte Sakai  Chief complaint: Increased shortness of breath, wheezing, potential asthma flare  43 year old female never smoker followed in our office for intrinsic asthma contacted our office via [place] today reporting worsened breathing.  Patient reports that symptoms started to worsen around 08/29/2019.  She is had to use her rescue inhaler 4 times a day since then.  She is continued to take her Symbicort 80.  She has had worsened wheezing.  She has a nonproductive dry cough.  ACT score today is 8.  Asthma Control Test ACT Total Score  09/01/2019 8    Observations/Objective:  07/20/2014-allergy profile- elevations with fescue, rye, Timothy grass, red top, IgE less than 1.5  07/20/2014-CBC with differential- eosinophils relative 7, eosinophils absolute 0.7  08/05/2014-pulmonary function test- FVC 3.04 (91% predicted), postbronchodilator ratio 80, postbronchodilator FEV1 2.52 (92% predicted), no bronchodilator response, mid flow reversibility, DLCO 22.59 (115%  predicted)  Assessment and Plan:  Intrinsic asthma Plan: Prednisone taper today Believe we can hold off on antibiotics as patient has a nonproductive dry cough as well as having no fatigue or systemic symptoms Patient knows to contact our office if the symptoms develop and then we can consider antibiotic therapy such as azithromycin Continue Symbicort 80 Continue albuterol rescue inhaler or nebulized treatments every 6 hours as needed for shortness of breath or wheezing If patient is not improving despite these interventions she needs to contact our office and likely will need an in person evaluation If patient requires an additional steroid taper within the next 12 months patient should consider having additional blood work (repeat IgE, repeat CBC with differential) done to confirm peripheral eosinophilia as well as increase Symbicort to 160 dose Continue daily antihistamine Would recommend nasal saline rinses and summer and fall months as patient has recurrent exacerbations at this time to help with removing triggers   Allergic rhinitis Plan: Continue daily antihistamine Start nasal saline rinses 1-2 times daily especially in fall in summer months to help with asthma symptoms  GERD Plan: Continue omeprazole  Eosinophilia Previously seen on last blood work in 2015 May need to consider repeat blood work if patient has recurrent flares If patient continues to have recurrent flares likely will need higher dose ICS   Follow Up Instructions:  Return in about 11 months (around 07/31/2020), or if symptoms worsen or fail to improve, for Follow up with Dr. Lamonte Sakai.   I discussed the assessment and treatment plan with the patient. The patient was provided an opportunity to ask questions and all were answered. The patient agreed with the plan and demonstrated an understanding of the instructions.  The patient was advised to call back or seek an in-person evaluation if the symptoms worsen or  if the condition fails to improve as anticipated.  I provided 24 minutes of non-face-to-face time during this encounter.   Lauraine Rinne, NP

## 2019-09-01 NOTE — Telephone Encounter (Signed)
Received email message from patient.  Attempted to call patient to schedule a video or telephone visit with an NP.  Left voicemail and sent mychart message.   "The change in weather has caused my asthma to flare up.  I have done a few breathing treatments, have been having to use my rescue inhaler and have been having to clear my throat a lot.  It has settled in my chest.  I have tried mucinex d and some sinus meds to try to clear up the congestion, but this is definite asthma.  There is a lot of wheezing.  Could you send in prednisone and the zpack to [facility] in [place], [place]?"

## 2019-09-09 ENCOUNTER — Telehealth: Payer: 59 | Admitting: Physician Assistant

## 2019-09-09 DIAGNOSIS — J019 Acute sinusitis, unspecified: Secondary | ICD-10-CM

## 2019-09-09 NOTE — Progress Notes (Signed)

## 2019-09-09 NOTE — Progress Notes (Signed)
I have spent 5 minutes in review of e-visit questionnaire, review and updating patient chart, medical decision making and response to patient.   Merlyn Bollen Cody Brynlei Klausner, PA-C    

## 2019-09-10 MED ORDER — AMOXICILLIN-POT CLAVULANATE 875-125 MG PO TABS: 1.0000 | ORAL_TABLET | Freq: Two times a day (BID) | ORAL | 0 refills | Status: DC

## 2019-09-10 NOTE — Addendum Note (Signed)
Addended by: Evelina Dun A on: 09/10/2019 08:21 AM   Modules accepted: Orders

## 2019-09-27 ENCOUNTER — Other Ambulatory Visit: Payer: Self-pay | Admitting: Emergency Medicine

## 2019-09-27 MED ORDER — OMEPRAZOLE 20 MG PO CPDR: 20.0000 mg | DELAYED_RELEASE_CAPSULE | Freq: Every day | ORAL | 3 refills | Status: DC

## 2019-10-04 ENCOUNTER — Telehealth: Payer: 59 | Admitting: Family

## 2019-10-04 DIAGNOSIS — J45909 Unspecified asthma, uncomplicated: Secondary | ICD-10-CM

## 2019-10-04 MED ORDER — PREDNISONE 20 MG PO TABS: 40.0000 mg | ORAL_TABLET | Freq: Every day | ORAL | 0 refills | Status: AC

## 2019-10-04 NOTE — Progress Notes (Signed)
E Visit for Asthma  Based on what you have shared with me, it looks like you may have a flare up of your asthma.  Asthma is a chronic (ongoing) lung disease which results in airway obstruction, inflammation and hyper-responsiveness.   Asthma symptoms vary from person to person, with common symptoms including nighttime awakening and decreased ability to participate in normal activities as a result of shortness of breath. It is often triggered by changes in weather, changes in the season, changes in air temperature, or inside (home, school, daycare or work) allergens such as animal dander, mold, mildew, woodstoves or cockroaches.   It can also be triggered by hormonal changes, extreme emotion, physical exertion or an upper respiratory tract illness.     It is important to identify the trigger, and then eliminate or avoid the trigger if possible.   If you have been prescribed medications to be taken on a regular basis, it is important to follow the asthma action plan and to follow guidelines to adjust medication in response to increasing symptoms of decreased peak expiratory flow rate  Treatment: I have prescribed: Prednisone 41m by mouth per day for 7 days. You need to continue your Symbicort twice a day every day and keep your follow up with with Pulmonologist's as it does not seem like your asthma is well controlled.   HOME CARE . Only take medications as instructed by your medical team. . Consider wearing a mask or scarf to improve breathing air temperature have been shown to decrease irritation and decrease exacerbations . Get rest. . Taking a steamy shower or using a humidifier may help nasal congestion sand ease sore throat pain. You can place a towel over your head and breathe in the steam from hot water coming from a faucet. . Using a saline nasal spray works much the same way.   . Cough drops, hare candies and sore throat lozenges may ease your cough.  . Avoid close contacts especially the very you and the elderly . Cover your mouth if you cough or sneeze . Always remember to wash your hands.    GET HELP RIGHT AWAY IF: . You develop worsening symptoms; breathlessness at rest, drowsy, confused or agitated, unable to speak in full sentences . You have coughing fits . You develop a severe headache or visual changes . You develop shortness of breath, difficulty breathing or start having chest pain . Your symptoms persist after you have completed your treatment plan . If your symptoms do not improve within 10 days  MAKE SURE YOU . Understand these instructions. . Will watch your condition. . Will get help right away if you are not doing well or get worse.   Your e-visit answers were reviewed by a board certified advanced clinical practitioner to complete your personal care plan, Depending upon the condition, your plan could have included both over the counter or prescription medications.  Please review your pharmacy choice. Your safety is important to [place] If you have drug allergies check your prescription carefully. You can use MyChart to ask questions about today's visit, request a non-urgent call back, or ask for a work or school excuse for 24 hours related to this e-Visit. If it has been greater than 24 hours you will need to follow up with your provider, or enter a new e-Visit to address those concerns.  You will get an e-mail in the next two days asking about your experience. I hope that your e-visit has been valuable and will  speed your recovery. Thank you for using e-visits.   Approximately 5 minutes was spent documenting and reviewing patient's chart.

## 2019-10-28 ENCOUNTER — Telehealth: Payer: 59 | Admitting: Emergency Medicine

## 2019-10-28 DIAGNOSIS — J069 Acute upper respiratory infection, unspecified: Secondary | ICD-10-CM

## 2019-10-28 MED ORDER — BENZONATATE 100 MG PO CAPS: 100.0000 mg | ORAL_CAPSULE | Freq: Three times a day (TID) | ORAL | 0 refills | Status: DC | PRN

## 2019-10-28 NOTE — Progress Notes (Signed)
We are sorry you are not feeling well.  Here is how we plan to help!  Based on what you have shared with me, it looks like you may have a viral upper respiratory infection.  Upper respiratory infections are caused by a large number of viruses; however, rhinovirus is the most common cause.   Symptoms vary from person to person, with common symptoms including sore throat, cough, fatigue or lack of energy and feeling of general discomfort.  A low-grade fever of up to 100.4 may present, but is often uncommon.  Symptoms vary however, and are closely related to a person's age or underlying illnesses.  The most common symptoms associated with an upper respiratory infection are nasal discharge or congestion, cough, sneezing, headache and pressure in the ears and face.  These symptoms usually persist for about 3 to 10 days, but can last up to 2 weeks.  It is important to know that upper respiratory infections do not cause serious illness or complications in most cases.    Upper respiratory infections can be transmitted from person to person, with the most common method of transmission being a person's hands.  The virus is able to live on the skin and can infect other persons for up to 2 hours after direct contact.  Also, these can be transmitted when someone coughs or sneezes; thus, it is important to cover the mouth to reduce this risk.  To keep the spread of the illness at [place], good hand hygiene is very important.  This is an infection that is most likely caused by a virus. There are no specific treatments other than to help you with the symptoms until the infection runs its course.  We are sorry you are not feeling well.  Here is how we plan to help!   For nasal congestion, you may use an oral decongestants such as Mucinex D or if you have glaucoma or high blood pressure use plain Mucinex.  Saline nasal spray or nasal drops can help and can safely be used as often as needed for congestion.   If you do not  have a history of heart disease, hypertension, diabetes or thyroid disease, prostate/bladder issues or glaucoma, you may also use Sudafed to treat nasal congestion.  It is highly recommended that you consult with a pharmacist or your primary care physician to ensure this medication is safe for you to take.     If you have a cough, you may use cough suppressants such as Delsym and Robitussin.  If you have glaucoma or high blood pressure, you can also use Coricidin HBP.   For cough I have prescribed for you A prescription cough medication called Tessalon Perles 100 mg. You may take 1-2 capsules every 8 hours as needed for cough  If you have a sore or scratchy throat, use a saltwater gargle-  to  teaspoon of salt dissolved in a 4-ounce to 8-ounce glass of warm water.  Gargle the solution for approximately 15-30 seconds and then spit.  It is important not to swallow the solution.  You can also use throat lozenges/cough drops and Chloraseptic spray to help with throat pain or discomfort.  Warm or cold liquids can also be helpful in relieving throat pain.  For headache, pain or general discomfort, you can use Ibuprofen or Tylenol as directed.   Some authorities believe that zinc sprays or the use of Echinacea may shorten the course of your symptoms.   HOME CARE . Only take medications as  instructed by your medical team. . Be sure to drink plenty of fluids. Water is fine as well as fruit juices, sodas and electrolyte beverages. You may want to stay away from caffeine or alcohol. If you are nauseated, try taking small sips of liquids. How do you know if you are getting enough fluid? Your urine should be a pale yellow or almost colorless. . Get rest. . Taking a steamy shower or using a humidifier may help nasal congestion and ease sore throat pain. You can place a towel over your head and breathe in the steam from hot water coming from a faucet. . Using a saline nasal spray works much the same way. . Cough  drops, hard candies and sore throat lozenges may ease your cough. . Avoid close contacts especially the very young and the elderly . Cover your mouth if you cough or sneeze . Always remember to wash your hands.   GET HELP RIGHT AWAY IF: . You develop worsening fever. . If your symptoms do not improve within 10 days . You develop yellow or green discharge from your nose over 3 days. . You have coughing fits . You develop a severe head ache or visual changes. . You develop shortness of breath, difficulty breathing or start having chest pain . Your symptoms persist after you have completed your treatment plan  MAKE SURE YOU   Understand these instructions.  Will watch your condition.  Will get help right away if you are not doing well or get worse.  Your e-visit answers were reviewed by a board certified advanced clinical practitioner to complete your personal care plan. Depending upon the condition, your plan could have included both over the counter or prescription medications. Please review your pharmacy choice. If there is a problem, you may call our nursing hot line at and have the prescription routed to another pharmacy. Your safety is important to [place]. If you have drug allergies check your prescription carefully.   You can use MyChart to ask questions about today's visit, request a non-urgent call back, or ask for a work or school excuse for 24 hours related to this e-Visit. If it has been greater than 24 hours you will need to follow up with your provider, or enter a new e-Visit to address those concerns. You will get an e-mail in the next two days asking about your experience.  I hope that your e-visit has been valuable and will speed your recovery. Thank you for using e-visits.  Greater than 5 minutes, yet less than 10 minutes of time have been spent researching, coordinating, and implementing care for this patient today

## 2019-11-12 ENCOUNTER — Telehealth: Payer: 59 | Admitting: Family

## 2019-11-12 DIAGNOSIS — J4520 Mild intermittent asthma, uncomplicated: Secondary | ICD-10-CM

## 2019-11-12 MED ORDER — PREDNISONE 20 MG PO TABS: 20.0000 mg | ORAL_TABLET | Freq: Every day | ORAL | 0 refills | Status: DC

## 2019-11-12 MED ORDER — ALBUTEROL SULFATE (2.5 MG/3ML) 0.083% IN NEBU: 2.5000 mg | INHALATION_SOLUTION | Freq: Four times a day (QID) | RESPIRATORY_TRACT | 1 refills | Status: DC | PRN

## 2019-11-12 NOTE — Progress Notes (Signed)
Visit for Asthma  Based on what you have shared with me, it looks like you may have a flare up of your asthma.  Asthma is a chronic (ongoing) lung disease which results in airway obstruction, inflammation and hyper-responsiveness.   Asthma symptoms vary from person to person, with common symptoms including nighttime awakening and decreased ability to participate in normal activities as a result of shortness of breath. It is often triggered by changes in weather, changes in the season, changes in air temperature, or inside (home, school, daycare or work) allergens such as animal dander, mold, mildew, woodstoves or cockroaches.   It can also be triggered by hormonal changes, extreme emotion, physical exertion or an upper respiratory tract illness.     It is important to identify the trigger, and then eliminate or avoid the trigger if possible.   If you have been prescribed medications to be taken on a regular basis, it is important to follow the asthma action plan and to follow guidelines to adjust medication in response to increasing symptoms of decreased peak expiratory flow rate  Treatment: I have prescribed: Prednisone 4m by mouth per day for 5 - 7 days. I have also sent in medication for your nebulizer.  HOME CARE . Only take medications as instructed by your medical team. . Consider wearing a mask or scarf to improve breathing air temperature have been shown to decrease irritation and decrease exacerbations . Get rest. . Taking a steamy shower or using a humidifier may help nasal congestion sand ease sore throat pain. You can place a towel over your head and breathe in the steam from hot water coming from a faucet. . Using a saline nasal spray works much the same way.  . Cough drops, hare candies and sore throat lozenges may ease your cough.  . Avoid close contacts especially the  very you and the elderly . Cover your mouth if you cough or sneeze . Always remember to wash your hands.    GET HELP RIGHT AWAY IF: . You develop worsening symptoms; breathlessness at rest, drowsy, confused or agitated, unable to speak in full sentences . You have coughing fits . You develop a severe headache or visual changes . You develop shortness of breath, difficulty breathing or start having chest pain . Your symptoms persist after you have completed your treatment plan . If your symptoms do not improve within 10 days  MAKE SURE YOU . Understand these instructions. . Will watch your condition. . Will get help right away if you are not doing well or get worse.   Your e-visit answers were reviewed by a board certified advanced clinical practitioner to complete your personal care plan, Depending upon the condition, your plan could have included both over the counter or prescription medications.  Please review your pharmacy choice. Your safety is important to [place] If you have drug allergies check your prescription carefully. You can use MyChart to ask questions about today's visit, request a non-urgent call back, or ask for a work or school excuse for 24 hours related to this e-Visit. If it has been greater than 24 hours you will need to follow up with your provider, or enter a new e-Visit to address those concerns.  You will get an e-mail in the next two days asking about your experience. I hope that your e-visit has been valuable and will speed your recovery. Thank you for using e-visits.  Greater than 5 minutes, yet less than 10 minutes of time have  been spent researching, coordinating, and implementing care for this patient today.  Thank you for the details you included in the comment boxes. Those details are very helpful in determining the best course of treatment for you and help [place] to provide the best care.

## 2019-11-15 NOTE — Telephone Encounter (Signed)
Received the following message from patient on 11/27:  "I am out of town and having an asthma flare up.  Was wondering if you could call in prednisone and the smallest quantity possible of the liquid albuterol for my nebulizer.   Also, any other medicine you recommend.  I have already requested an appointment with you when I get back to [place]. "  Patient had an evisit on 11/27. She was prescribed prednisone 73m once daily for 5 days and also received a refill on her nebulizer medication. When asked, she stated that she was not feeling any better today. She said she normally gets a prednisone taper and this works well for her.   RB, please advise. Thanks!

## 2019-11-15 NOTE — Telephone Encounter (Signed)
Ok with me to extend the prednisone course >>  Take 36m daily for 3 days, then 344mdaily for 3 days, then 2071maily for 3 days, then 11m58mily for 3 days, then stop

## 2019-11-16 ENCOUNTER — Telehealth: Payer: Self-pay | Admitting: Occupational Therapy

## 2019-11-16 MED ORDER — PREDNISONE 10 MG PO TABS: ORAL_TABLET | ORAL | 0 refills | Status: DC

## 2019-11-16 NOTE — Telephone Encounter (Signed)
Call returned to patient, confirmed DOB, aware script has been sent. Voiced understanding.   Nothing further needed at this time.

## 2019-11-22 ENCOUNTER — Other Ambulatory Visit: Payer: Self-pay

## 2019-11-22 ENCOUNTER — Ambulatory Visit (INDEPENDENT_AMBULATORY_CARE_PROVIDER_SITE_OTHER): Payer: 59 | Admitting: Primary Care

## 2019-11-22 ENCOUNTER — Encounter: Payer: Self-pay | Admitting: Primary Care

## 2019-11-22 DIAGNOSIS — J45909 Unspecified asthma, uncomplicated: Secondary | ICD-10-CM

## 2019-11-22 MED ORDER — GUAIFENESIN ER 600 MG PO TB12: 1200.0000 mg | ORAL_TABLET | Freq: Two times a day (BID) | ORAL | 0 refills | Status: DC

## 2019-11-22 MED ORDER — MONTELUKAST SODIUM 10 MG PO TABS: 10.0000 mg | ORAL_TABLET | Freq: Every day | ORAL | 6 refills | Status: DC

## 2019-11-22 MED ORDER — BUDESONIDE-FORMOTEROL FUMARATE 160-4.5 MCG/ACT IN AERO: 2.0000 | INHALATION_SPRAY | Freq: Two times a day (BID) | RESPIRATORY_TRACT | 6 refills | Status: DC

## 2019-11-22 NOTE — Patient Instructions (Signed)
Asthma - Plan increase Symbicort 160 two puff twice daily; continue prn albuterol q 4-6 hrs - Continue prednisone taper until complete - Adding back Singulair 77m at bedtime; continue claritin daily  - Recommend DS mucinex twice daily with full glass of water - Check labs when off prednisone or during next exacerbation   Follow-up: 2-4 weeks with Dr. BLamonte Sakaior BEustaquio MaizeNP

## 2019-11-22 NOTE — Progress Notes (Signed)
Virtual Visit via Telephone Note  I connected with Katrina Roman on 11/22/19 at 10:00 AM EST by telephone and verified that I am speaking with the correct person using two identifiers.  Location: Patient: Home Provider: Office   I discussed the limitations, risks, security and privacy concerns of performing an evaluation and management service by telephone and the availability of in person appointments. I also discussed with the patient that there may be a patient responsible charge related to this service. The patient expressed understanding and agreed to proceed.   History of Present Illness: 43 year old female, never smoked. PMH significant for intrinsic asthma, allergic rhinitis, GERD. Patient of Dr. Lamonte Sakai, last seen on 09/01/19. Maintained on Symbicort 80. Eosinophil absolute 700. IgE <1.5.   Asthma exacerbations: November-December 2020 - two rounds prednisone, currently one second  09/01/19- prednisone taper 04/15/19- prednisone taper, zpack  11/22/2019 Patient contacted today for acute televisit. Reports waking up at night with wheezing and dry cough. Associated chest congestion. She is on her second round of prednisone prescribed from our office, currently taking 7m qd and has one week left. She is compliant with Symbicort 80 twice daily. She has tried using her albuterol nebulizer before bedtime and first thing in the morning. Occasionally needs her rescue inhaler in the middle of the night. States that she can not get rid of her chest congestion. She is taking Claritin daily as prescribed. She has been on Singulair in the past with good results but was taking off when her asthma symptoms were stable. Prednisone does help her symptoms. No recent sick contacts. Denies fever, chills, sweats, nasal congestion/sinusitis, chest tightness.   Observations/Objective:  - No significant shortness of breath, wheezing or cough noted during phone conversation  Significant  testing: 07/20/2014-allergy profile- elevations with fescue, rye, Timothy grass, red top, IgE less than 1.5  07/20/2014-CBC with differential- eosinophils relative 7, eosinophils absolute 0.7  08/05/2014-pulmonary function test- FVC 3.04 (91% predicted), postbronchodilator ratio 80, postbronchodilator FEV1 2.52 (92% predicted), no bronchodilator response, mid flow reversibility, DLCO 22.59 (115% predicted)   Assessment and Plan:  Asthma - Not well controlled; she has had three exacerbation this year requiring prednisone tapers - Plan increase Symbicort 160 two puff twice daily; continue prn albuterol q 4-6 hrs - Continue prednisone taper until complete - Adding back Singulair 126mat bedtime; continue claritin daily  - Recommend DS mucinex twice daily with full glass of water - Unable to check labs (CBC with diff, IgE) because she is currently on steroids, recommend checking when off prednisone or during next exacerbation  - Consider biologic in the future if above plan does not reduce exacerbations   Follow Up Instructions:  - FU in 2-4 weeks with Dr. ByLamonte Sakair NP   I discussed the assessment and treatment plan with the patient. The patient was provided an opportunity to ask questions and all were answered. The patient agreed with the plan and demonstrated an understanding of the instructions.   The patient was advised to call back or seek an in-person evaluation if the symptoms worsen or if the condition fails to improve as anticipated.  I provided 25 minutes of non-face-to-face time during this encounter.   ElMartyn EhrichNP

## 2019-11-26 ENCOUNTER — Other Ambulatory Visit: Payer: Self-pay | Admitting: *Deleted

## 2019-11-26 MED ORDER — ALBUTEROL SULFATE HFA 108 (90 BASE) MCG/ACT IN AERS: 2.0000 | INHALATION_SPRAY | Freq: Four times a day (QID) | RESPIRATORY_TRACT | 3 refills | Status: DC | PRN

## 2019-12-06 ENCOUNTER — Encounter: Payer: Self-pay | Admitting: Primary Care

## 2019-12-06 ENCOUNTER — Ambulatory Visit (INDEPENDENT_AMBULATORY_CARE_PROVIDER_SITE_OTHER): Payer: 59 | Admitting: Primary Care

## 2019-12-06 ENCOUNTER — Other Ambulatory Visit: Payer: Self-pay

## 2019-12-06 DIAGNOSIS — R05 Cough: Secondary | ICD-10-CM

## 2019-12-06 DIAGNOSIS — Z7951 Long term (current) use of inhaled steroids: Secondary | ICD-10-CM | POA: Diagnosis not present

## 2019-12-06 DIAGNOSIS — J45909 Unspecified asthma, uncomplicated: Secondary | ICD-10-CM | POA: Diagnosis not present

## 2019-12-06 NOTE — Progress Notes (Signed)
Virtual Visit via Telephone Note  I connected with Katrina Roman on 12/06/19 at 10:00 AM EST by telephone and verified that I am speaking with the correct person using two identifiers.  Location: Patient: Home  Provider: Office    I discussed the limitations, risks, security and privacy concerns of performing an evaluation and management service by telephone and the availability of in person appointments. I also discussed with the patient that there may be a patient responsible charge related to this service. The patient expressed understanding and agreed to proceed.   History of Present Illness: 43 year old female, never smoked. PMH significant for intrinsic asthma, allergic rhinitis, GERD. Patient of Dr. Lamonte Sakai, last seen on 09/01/19. Maintained on Symbicort 160 and Singulair (started 11/22/19). Eosinophil absolute 700. IgE <1.5.   Asthma exacerbations: November-December 2020 - two rounds prednisone, currently one second  09/01/19- prednisone taper 04/15/19- prednisone taper, zpack  Previous [facility] pulmonary encounter: 11/22/2019 Patient contacted today for acute televisit. Reports waking up at night with wheezing and dry cough. Associated chest congestion. She is on her second round of prednisone prescribed from our office, currently taking 68m qd and has one week left. She is compliant with Symbicort 80 twice daily. She has tried using her albuterol nebulizer before bedtime and first thing in the morning. Occasionally needs her rescue inhaler in the middle of the night. States that she can not get rid of her chest congestion. She is taking Claritin daily as prescribed. She has been on Singulair in the past with good results but was taking off when her asthma symptoms were stable. Prednisone does help her symptoms. No recent sick contacts. Denies fever, chills, sweats, nasal congestion/sinusitis, chest tightness.   12/06/2019 Patient contacted today for 2 week follow-up. She is feeling a lot  better after increasing Symbicort to 160 two puffs twice daily and adding back Singulair 156mat bedtime. She is still having to clear her throat and has some ear pressure/ache. Reports that her breathing has improved. She is not waking up in the middle of the night and has not needed to use her Albuterol nebulizer. Completed prednisone taper last week. She was unable to pick up mucinex. Denies shortness of breath, wheezing, chest tightness.   Observations/Objective:   - Mild nasal congestion and clearing her throat - No obvious shortness of breath or wheezing  Assessment and Plan:  Intrinsic asthma: - Improved after increasing ICS/LABA dose   - Continue Symbicort 160 two puffs twice daily - Continue Albuterol hfa/neb every 6 hours as needed for breakthrough shortness of breath/wheezing  - Checking CBC with diff/IgE when off prednisone   Allergic rhinitis: - Continue Singulair 1042mt bedtime and nasal spray/irrigation   Upper airway cough: - Prevent throat clearing with sips of water and sugar free lozenges - Try chlorphenamine (Chlor-a-tabs)- take 4mg50mery 4-6 hours for cough - Delsym cough syrup twice daily as needed   Follow Up Instructions:   - Annually with Dr. ByruLamonte Sakaisooner if needed  I discussed the assessment and treatment plan with the patient. The patient was provided an opportunity to ask questions and all were answered. The patient agreed with the plan and demonstrated an understanding of the instructions.   The patient was advised to call back or seek an in-person evaluation if the symptoms worsen or if the condition fails to improve as anticipated.  I provided 22 minutes of non-face-to-face time during this encounter.   ElizMartyn Ehrich

## 2019-12-06 NOTE — Patient Instructions (Addendum)
Nice speaking with you today Katrina Roman, I am glad you are feeling better  Asthma: Continue Symbicort 160 two puffs twice daily Continue Singulair 14m at bedtime Continue nasal spray and saline irrigation   Recommendations: Try decongestant over the counter for ear ache/pressure- if not better see PCP Try chlorphenamine (Chlor-a-tabs)- take 422mevery 4-6 hours for cough (can make you sleepy) Please have labs drawn in 1-3 weeks while off prednisone   Follow-up: Annually with DR. Bryum or sooner if needed

## 2019-12-30 ENCOUNTER — Telehealth: Payer: 59 | Admitting: Physician Assistant

## 2019-12-30 DIAGNOSIS — J329 Chronic sinusitis, unspecified: Secondary | ICD-10-CM | POA: Diagnosis not present

## 2019-12-30 MED ORDER — AMOXICILLIN-POT CLAVULANATE 875-125 MG PO TABS: 1.0000 | ORAL_TABLET | Freq: Two times a day (BID) | ORAL | 0 refills | Status: DC

## 2019-12-30 NOTE — Progress Notes (Signed)
We are sorry that you are not feeling well.   I have reviewed your prior records and it appears that you have had recurrent sinus infections. I have not seen that you have reported intermittent vision changes (similar to what occur with migraines) with your prior sinus infections. If this is a new symptom that you do not normally have with your sinus infections then I would advise you to seek an urgent, in person appointment with your primary care provider or at your nearest urgent care or emergency room, especially if this symptom is persistent.   With regard to your nasal congestion and other symptoms, it looks like you have sinusitis.  Sinusitis is inflammation and infection in the sinus cavities of the head.  Based on your presentation I believe you most likely have Acute Bacterial Sinusitis.  This is an infection caused by bacteria and is treated with antibiotics. I have prescribed Augmentin 828m/125mg one tablet twice daily with food, for 7 days. You may use an oral decongestant such as Mucinex D or if you have glaucoma or high blood pressure use plain Mucinex. Saline nasal spray help and can safely be used as often as needed for congestion.  If you develop worsening sinus pain, fever or notice severe headache and vision changes, or if symptoms are not better after completion of antibiotic, please schedule an appointment with a health care provider.    Sinus infections are not as easily transmitted as other respiratory infection, however we still recommend that you avoid close contact with loved ones, especially the very young and elderly.  Remember to wash your hands thoroughly throughout the day as this is the number one way to prevent the spread of infection!  Home Care:  Only take medications as instructed by your medical team.  Complete the entire course of an antibiotic.  Do not take these medications with alcohol.  A steam or ultrasonic humidifier can help congestion.  You can place a  towel over your head and breathe in the steam from hot water coming from a faucet.  Avoid close contacts especially the very young and the elderly.  Cover your mouth when you cough or sneeze.  Always remember to wash your hands.  Get Help Right Away If:  You develop worsening fever or sinus pain.  You develop a severe head ache or visual changes.  Your symptoms persist after you have completed your treatment plan.  Make sure you  Understand these instructions.  Will watch your condition.  Will get help right away if you are not doing well or get worse.  Your e-visit answers were reviewed by a board certified advanced clinical practitioner to complete your personal care plan.  Depending on the condition, your plan could have included both over the counter or prescription medications.  If there is a problem please reply  once you have received a response from your provider.  Your safety is important to [place]  If you have drug allergies check your prescription carefully.    You can use MyChart to ask questions about today's visit, request a non-urgent call back, or ask for a work or school excuse for 24 hours related to this e-Visit. If it has been greater than 24 hours you will need to follow up with your provider, or enter a new e-Visit to address those concerns.  You will get an e-mail in the next two days asking about your experience.  I hope that your e-visit has been valuable and will  speed your recovery. Thank you for using e-visits.   Greater than 5 minutes, yet less than 10 minutes of time have been spend researching, coordinating, and implementing care for this patient today.

## 2020-01-06 MED ORDER — AMOXICILLIN-POT CLAVULANATE 875-125 MG PO TABS: 1.0000 | ORAL_TABLET | Freq: Two times a day (BID) | ORAL | 0 refills | Status: DC

## 2020-01-06 NOTE — Addendum Note (Signed)
Addended by: Abigail Butts on: 01/06/2020 11:05 AM   Modules accepted: Orders

## 2020-01-28 ENCOUNTER — Telehealth: Payer: 59 | Admitting: Emergency Medicine

## 2020-01-28 DIAGNOSIS — J45901 Unspecified asthma with (acute) exacerbation: Secondary | ICD-10-CM | POA: Diagnosis not present

## 2020-01-28 MED ORDER — ALBUTEROL SULFATE HFA 108 (90 BASE) MCG/ACT IN AERS: 2.0000 | INHALATION_SPRAY | Freq: Four times a day (QID) | RESPIRATORY_TRACT | 0 refills | Status: DC | PRN

## 2020-01-28 MED ORDER — PREDNISONE 20 MG PO TABS: 40.0000 mg | ORAL_TABLET | Freq: Every day | ORAL | 0 refills | Status: DC

## 2020-01-28 MED ORDER — ALBUTEROL SULFATE (2.5 MG/3ML) 0.083% IN NEBU: 2.5000 mg | INHALATION_SOLUTION | Freq: Four times a day (QID) | RESPIRATORY_TRACT | 0 refills | Status: DC | PRN

## 2020-01-28 NOTE — Progress Notes (Signed)
Visit for Asthma  Based on what you have shared with me, it looks like you may have a flare up of your asthma.  Asthma is a chronic (ongoing) lung disease which results in airway obstruction, inflammation and hyper-responsiveness.   Asthma symptoms vary from person to person, with common symptoms including nighttime awakening and decreased ability to participate in normal activities as a result of shortness of breath. It is often triggered by changes in weather, changes in the season, changes in air temperature, or inside (home, school, daycare or work) allergens such as animal dander, mold, mildew, woodstoves or cockroaches.   It can also be triggered by hormonal changes, extreme emotion, physical exertion or an upper respiratory tract illness.     It is important to identify the trigger, and then eliminate or avoid the trigger if possible.   If you have been prescribed medications to be taken on a regular basis, it is important to follow the asthma action plan and to follow guidelines to adjust medication in response to increasing symptoms of decreased peak expiratory flow rate  Treatment: I have prescribed: Albuterol (Proventil HFA; Ventolin HFA) 108 (90 Base) MCG/ACT Inhaler 2 puffs into the lungs every six hours as needed for wheezing or shortness of breath, a refill of your nebulizer solution, and prednisone.  If you have persistent symptoms, you should be evaluated in person.  I see that you have been seen in the last month for sinusitis.  If you are continuing to have symptoms, you should be seen in-person.  Continuing to manage your symptoms virtually may not be appropriate.  HOME CARE . Only take medications as instructed by your medical team. . Consider wearing a mask or scarf to improve breathing air temperature have been shown to decrease irritation and decrease  exacerbations . Get rest. . Taking a steamy shower or using a humidifier may help nasal congestion sand ease sore throat pain. You can place a towel over your head and breathe in the steam from hot water coming from a faucet. . Using a saline nasal spray works much the same way.  . Cough drops, hare candies and sore throat lozenges may ease your cough.  . Avoid close contacts especially the very you and the elderly . Cover your mouth if you cough or sneeze . Always remember to wash your hands.    GET HELP RIGHT AWAY IF: . You develop worsening symptoms; breathlessness at rest, drowsy, confused or agitated, unable to speak in full sentences . You have coughing fits . You develop a severe headache or visual changes . You develop shortness of breath, difficulty breathing or start having chest pain . Your symptoms persist after you have completed your treatment plan . If your symptoms do not improve within 10 days  MAKE SURE YOU . Understand these instructions. . Will watch your condition. . Will get help right away if you are not doing well or get worse.   Your e-visit answers were reviewed by a board certified advanced clinical practitioner to complete your personal care plan, Depending upon the condition, your plan could have included both over the counter or prescription medications.  Please review your pharmacy choice. Your safety is important to [place]. If you have drug allergies check your prescription carefully. You can use MyChart to ask questions about today's visit, request a non-urgent call back, or ask for a work or school excuse for 24 hours related to this e-Visit. If it has been greater than 24 hours  you will need to follow up with your provider, or enter a new e-Visit to address those concerns.  You will get an e-mail in the next two days asking about your experience. I hope that your e-visit has been valuable and will speed your recovery. Thank you for using e-visits. Greater  than 5 minutes, yet less than 10 minutes was used in reviewing the patient's chart, questionnaire, prescribing medications, and documentation for this visit.

## 2020-03-28 ENCOUNTER — Telehealth: Payer: 59 | Admitting: Emergency Medicine

## 2020-03-28 DIAGNOSIS — J329 Chronic sinusitis, unspecified: Secondary | ICD-10-CM

## 2020-03-28 MED ORDER — AMOXICILLIN-POT CLAVULANATE 875-125 MG PO TABS: 1.0000 | ORAL_TABLET | Freq: Two times a day (BID) | ORAL | 0 refills | Status: DC

## 2020-03-28 NOTE — Progress Notes (Signed)
We are sorry that you are not feeling well.  Here is how we plan to help!  Based on what you have shared with me it looks like you have sinusitis.  Sinusitis is inflammation and infection in the sinus cavities of the head.  Based on your presentation I believe you most likely have Acute Bacterial Sinusitis.  This is an infection caused by bacteria and is treated with antibiotics. I have prescribed Augmentin 859m/125mg one tablet twice daily with food, for 7 days. You may use an oral decongestant such as Mucinex D or if you have glaucoma or high blood pressure use plain Mucinex. Saline nasal spray help and can safely be used as often as needed for congestion.  If you develop worsening sinus pain, fever or notice severe headache and vision changes, or if symptoms are not better after completion of antibiotic, please schedule an appointment with a health care provider.    Sinus infections are not as easily transmitted as other respiratory infection, however we still recommend that you avoid close contact with loved ones, especially the very young and elderly.  Remember to wash your hands thoroughly throughout the day as this is the number one way to prevent the spread of infection!  Home Care:  Only take medications as instructed by your medical team.  Complete the entire course of an antibiotic.  Do not take these medications with alcohol.  A steam or ultrasonic humidifier can help congestion.  You can place a towel over your head and breathe in the steam from hot water coming from a faucet.  Avoid close contacts especially the very young and the elderly.  Cover your mouth when you cough or sneeze.  Always remember to wash your hands.  Get Help Right Away If:  You develop worsening fever or sinus pain.  You develop a severe head ache or visual changes.  Your symptoms persist after you have completed your treatment plan.  Make sure you  Understand these instructions.  Will watch your  condition.  Will get help right away if you are not doing well or get worse.  Your e-visit answers were reviewed by a board certified advanced clinical practitioner to complete your personal care plan.  Depending on the condition, your plan could have included both over the counter or prescription medications.  If there is a problem please reply  once you have received a response from your provider.  Your safety is important to [place]  If you have drug allergies check your prescription carefully.    You can use MyChart to ask questions about today's visit, request a non-urgent call back, or ask for a work or school excuse for 24 hours related to this e-Visit. If it has been greater than 24 hours you will need to follow up with your provider, or enter a new e-Visit to address those concerns.  You will get an e-mail in the next two days asking about your experience.  I hope that your e-visit has been valuable and will speed your recovery. Thank you for using e-visits.   Greater than 5 minutes, yet less than 10 minutes was used in reviewing the patient's chart, questionnaire, prescribing medications, and documentation for this visit.

## 2020-05-10 ENCOUNTER — Other Ambulatory Visit: Payer: Self-pay | Admitting: Primary Care

## 2020-06-05 ENCOUNTER — Telehealth: Payer: 59 | Admitting: Family

## 2020-06-05 DIAGNOSIS — J4521 Mild intermittent asthma with (acute) exacerbation: Secondary | ICD-10-CM | POA: Diagnosis not present

## 2020-06-05 MED ORDER — ALBUTEROL SULFATE HFA 108 (90 BASE) MCG/ACT IN AERS: 2.0000 | INHALATION_SPRAY | Freq: Four times a day (QID) | RESPIRATORY_TRACT | 0 refills | Status: DC | PRN

## 2020-06-05 MED ORDER — PREDNISONE 20 MG PO TABS: 40.0000 mg | ORAL_TABLET | Freq: Every day | ORAL | 0 refills | Status: DC

## 2020-06-05 NOTE — Progress Notes (Signed)
Visit for Asthma  Based on what you have shared with me, it looks like you may have a flare up of your asthma.  Asthma is a chronic (ongoing) lung disease which results in airway obstruction, inflammation and hyper-responsiveness.   Asthma symptoms vary from person to person, with common symptoms including nighttime awakening and decreased ability to participate in normal activities as a result of shortness of breath. It is often triggered by changes in weather, changes in the season, changes in air temperature, or inside (home, school, daycare or work) allergens such as animal dander, mold, mildew, woodstoves or cockroaches.   It can also be triggered by hormonal changes, extreme emotion, physical exertion or an upper respiratory tract illness.     It is important to identify the trigger, and then eliminate or avoid the trigger if possible.   If you have been prescribed medications to be taken on a regular basis, it is important to follow the asthma action plan and to follow guidelines to adjust medication in response to increasing symptoms of decreased peak expiratory flow rate  Treatment: I have prescribed: Albuterol (Proventil HFA; Ventolin HFA) 108 (90 Base) MCG/ACT Inhaler 2 puffs into the lungs every six hours as needed for wheezing or shortness of breath  HOME CARE  Only take medications as instructed by your medical team.  Consider wearing a mask or scarf to improve breathing air temperature have been shown to decrease irritation and decrease exacerbations  Get rest.  Taking a steamy shower or using a humidifier may help nasal congestion sand ease sore throat pain. You can place a towel over your head and breathe in the steam from hot water coming from a faucet.  Using a saline nasal spray works much the same way.   Cough drops, hare candies and sore throat lozenges may  ease your cough.   Avoid close contacts especially the very you and the elderly  Cover your mouth if you cough or sneeze  Always remember to wash your hands.    GET HELP RIGHT AWAY IF:  You develop worsening symptoms; breathlessness at rest, drowsy, confused or agitated, unable to speak in full sentences  You have coughing fits  You develop a severe headache or visual changes  You develop shortness of breath, difficulty breathing or start having chest pain  Your symptoms persist after you have completed your treatment plan  If your symptoms do not improve within 10 days  MAKE SURE YOU  Understand these instructions.  Will watch your condition.  Will get help right away if you are not doing well or get worse.   Your e-visit answers were reviewed by a board certified advanced clinical practitioner to complete your personal care plan, Depending upon the condition, your plan could have included both over the counter or prescription medications.  Please review your pharmacy choice. Your safety is important to [place]. If you have drug allergies check your prescription carefully. You can use MyChart to ask questions about today's visit, request a non-urgent call back, or ask for a work or school excuse for 24 hours related to this e-Visit. If it has been greater than 24 hours you will need to follow up with your provider, or enter a new e-Visit to address those concerns.  You will get an e-mail in the next two days asking about your experience. I hope that your e-visit has been valuable and will speed your recovery. Thank you for using e-visits.  Greater than 5 minutes, yet less  than 10 minutes of time have been spent researching, coordinating, and implementing care for this patient today.  Thank you for the details you included in the comment boxes. Those details are very helpful in determining the best course of treatment for you and help [place] to provide the best care.

## 2020-06-05 NOTE — Addendum Note (Signed)
Addended by: Dutch Quint B on: 06/05/2020 12:26 PM   Modules accepted: Orders

## 2020-07-17 ENCOUNTER — Telehealth: Payer: 59 | Admitting: Physician Assistant

## 2020-07-17 DIAGNOSIS — R05 Cough: Secondary | ICD-10-CM

## 2020-07-17 DIAGNOSIS — R059 Cough, unspecified: Secondary | ICD-10-CM

## 2020-07-17 MED ORDER — BENZONATATE 100 MG PO CAPS: 100.0000 mg | ORAL_CAPSULE | Freq: Three times a day (TID) | ORAL | 0 refills | Status: AC

## 2020-07-17 MED ORDER — PREDNISONE 10 MG PO TABS: ORAL_TABLET | ORAL | 0 refills | Status: DC

## 2020-07-17 NOTE — Telephone Encounter (Signed)
Called and spoke with pt letting her know that we were going to send pred Rx to the pharmacy for her and she verbalized understanding. Verified pt's preferred pharmacy and sent Rx to pharmacy for her. Nothing further needed.

## 2020-07-17 NOTE — Telephone Encounter (Signed)
Ok to treat with pred: 16m qd x 3 days, 286mqd x 3 days, 10104md x 3 days, stop

## 2020-07-17 NOTE — Telephone Encounter (Signed)
Dr. Lamonte Sakai, please see pt's mychart message and advise.

## 2020-07-17 NOTE — Progress Notes (Signed)
g  Your current symptoms could be consistent with the coronavirus.  Many health care providers can now test patients at their office but not all are.  [facility] has multiple testing sites. For information on our [facility] testing locations and hours go to [email or web address]  We are enrolling you in our [place] for [place] . Daily you will receive a questionnaire within the [facility] website. Our COVID 19 response team will be monitoring your responses daily.  Testing Information: The COVID-19 Community Testing sites will begin testing BY APPOINTMENT ONLY.  You can schedule online at [email or web address]  If you do not have access to a smart phone or computer you may call [phone] for an appointment.   Additional testing sites in the Community:  . For [facility] Testing sites in [facility]  [email or web address]  . For Pop-up testing sites in [place]  [email or web address]  . For Testing sites with regular hours [email or web address]  . For [place] [email or web address]  . For [facility] [email or web address]  . For [facility] testing in [place] and [employer] [email or web address]  . For [facility] testing in [facility]   [email or web address]  For  more information about community testing call [phone]   Please quarantine yourself while awaiting your test results. Please stay home for a minimum of 10 days from the first day of illness with improving symptoms and you have had 24 hours of no fever (without the use of Tylenol (Acetaminophen) Motrin (Ibuprofen) or any fever  reducing medication).  Also - Do not get tested prior to returning to work because once you have had a positive test the test can stay positive for more then a month in some cases.   You should wear a mask or cloth face covering over your nose and mouth if you must be around other people or animals, including pets (even at home). Try to stay at least 6 feet away from other people. This will protect the people around you.  Please continue good preventive care measures, including:  frequent hand-washing, avoid touching your face, cover coughs/sneezes, stay out of crowds and keep a 6 foot distance from others.  COVID-19 is a respiratory illness with symptoms that are similar to the flu. Symptoms are typically mild to moderate, but there have been cases of severe illness and death due to the virus.   The following symptoms may appear 2-14 days after exposure: . Fever . Cough . Shortness of breath or difficulty breathing . Chills . Repeated shaking with chills . Muscle pain . Headache . Sore throat . New loss of taste or smell . Fatigue . Congestion or runny nose . Nausea or vomiting . Diarrhea  Go to the nearest hospital ED for assessment if fever/cough/breathlessness are severe or illness seems like a threat to life.  It is vitally important that if you feel that you have an infection such as this virus or any other virus that you stay home and away from places where you may spread it to others.  You should avoid contact with people age 109 and older.   You can use medication such as A prescription cough medication called Tessalon Perles 100 mg. You may take 1-2 capsules every 8 hours as needed for cough  You may also take acetaminophen (Tylenol) as needed for fever.  Reduce your risk of any infection by using the same precautions used for avoiding the common cold or flu:  Mar[place] Kitchen Wash your hands often with soap and  warm water for at least 20 seconds.  If soap and water are not readily available, use  an alcohol-based hand sanitizer with at least 60% alcohol.  . If coughing or sneezing, cover your mouth and nose by coughing or sneezing into the elbow areas of your shirt or coat, into a tissue or into your sleeve (not your hands). . Avoid shaking hands with others and consider head nods or verbal greetings only. . Avoid touching your eyes, nose, or mouth with unwashed hands.  . Avoid close contact with people who are sick. . Avoid places or events with large numbers of people in one location, like concerts or sporting events. . Carefully consider travel plans you have or are making. . If you are planning any travel outside or inside the [place], visit the CDC's Travelers' Health webpage for the latest health notices. . If you have some symptoms but not all symptoms, continue to monitor at home and seek medical attention if your symptoms worsen. . If you are having a medical emergency, call 911.  HOME CARE . Only take medications as instructed by your medical team. . Drink plenty of fluids and get plenty of rest. . A steam or ultrasonic humidifier can help if you have congestion.   GET HELP RIGHT AWAY IF YOU HAVE EMERGENCY WARNING SIGNS** FOR COVID-19. If you or someone is showing any of these signs seek emergency medical care immediately. Call 911 or proceed to your closest emergency facility if: . You develop worsening high fever. . Trouble breathing . Bluish lips or face . Persistent pain or pressure in the chest . New confusion . Inability to wake or stay awake . You cough up blood. . Your symptoms become more severe  **This list is not all possible symptoms. Contact your medical provider for any symptoms that are sever or concerning to you.  MAKE SURE YOU   Understand these instructions.  Will watch your condition.  Will get help right away if you are not doing well or get worse.  Your e-visit answers were reviewed by a board certified advanced clinical practitioner to complete  your personal care plan.  Depending on the condition, your plan could have included both over the counter or prescription medications.  If there is a problem please reply once you have received a response from your provider.  Your safety is important to [place].  If you have drug allergies check your prescription carefully.    You can use MyChart to ask questions about today's visit, request a non-urgent call back, or ask for a work or school excuse for 24 hours related to this e-Visit. If it has been greater than 24 hours you will need to follow up with your provider, or enter a new e-Visit to address those concerns. You will get an e-mail in the next two days asking about your experience.  I hope that your e-visit has been valuable and will speed your recovery. Thank you for using e-visits.   Approximately 5 minutes was spent documenting and reviewing patient's chart.

## 2020-08-07 ENCOUNTER — Ambulatory Visit: Payer: 59 | Admitting: Emergency Medicine

## 2020-08-07 ENCOUNTER — Encounter: Payer: Self-pay | Admitting: Emergency Medicine

## 2020-08-07 ENCOUNTER — Other Ambulatory Visit: Payer: Self-pay

## 2020-08-07 DIAGNOSIS — J309 Allergic rhinitis, unspecified: Secondary | ICD-10-CM | POA: Diagnosis not present

## 2020-08-07 DIAGNOSIS — K219 Gastro-esophageal reflux disease without esophagitis: Secondary | ICD-10-CM

## 2020-08-07 DIAGNOSIS — J45909 Unspecified asthma, uncomplicated: Secondary | ICD-10-CM

## 2020-08-07 MED ORDER — PANTOPRAZOLE SODIUM 40 MG PO TBEC
40.0000 mg | DELAYED_RELEASE_TABLET | Freq: Two times a day (BID) | ORAL | 0 refills | Status: DC
Start: 2020-08-07 — End: 2020-08-22

## 2020-08-07 NOTE — Patient Instructions (Signed)
Continue Symbicort 160/4.5 mcg, 2 puffs twice a day for now.  We may modify this going forward.  Rinse and gargle after using. Keep albuterol available to use 2 puffs when you need if shortness breath, chest tightness, wheezing. We will probably repeat your pulmonary function testing going forward. We will also consider ordering lab work (IgE, eosinophil count) at a future visit Continue Singulair 10 mg each evening Continue loratadine 10 mg daily Add back your Flonase nasal spray, 2 sprays each nostril once daily. You might want to consider also adding back your daily nasal saline rinses if your allergies, cough, throat irritation continue Stop your omeprazole (Prilosec) Start pantoprazole 40 mg.  Take this twice a day for 1 week and then decrease to once a day.  Please take 1 hour around food. Try to practice a reflux avoidance diet by minimizing chocolate, citrus, caffeine, etc.  Do not eat after 8 PM and keep the head of your bed elevated if possible Try to avoid throat clearing if at all possible Follow with Dr Lamonte Sakai in 1 month or next available to discuss status with these changes.

## 2020-08-07 NOTE — Assessment & Plan Note (Signed)
Continue Singulair 10 mg each evening Continue loratadine 10 mg daily Add back your Flonase nasal spray, 2 sprays each nostril once daily. You might want to consider also adding back your daily nasal saline rinses if your allergies, cough, throat irritation continue

## 2020-08-07 NOTE — Assessment & Plan Note (Signed)
She has been flaring more frequently for the last year.  Based on her description she believes that it is mostly upper airway irritation as opposed to bronchospasm.  In particular her symptoms have not always responded to albuterol.  Exacerbating factors appear to be allergic rhinitis, GERD.  We will work on treating both.  I like to repeat her pulmonary function testing to quantify her degree of obstruction but I want her upper airway irritation improved first.  I will also check an IgE, eosinophil count in the future  Continue Symbicort 160/4.5 mcg, 2 puffs twice a day for now.  We may modify this going forward.  Rinse and gargle after using. Keep albuterol available to use 2 puffs when you need if shortness breath, chest tightness, wheezing. We will probably repeat your pulmonary function testing going forward. We will also consider ordering lab work (IgE, eosinophil count) at a future visit Continue Singulair 10 mg each evening Try to avoid throat clearing if at all possible Follow with Dr Lamonte Sakai in 1 month or next available to discuss status with these changes

## 2020-08-07 NOTE — Assessment & Plan Note (Signed)
Stop your omeprazole (Prilosec) Start pantoprazole 40 mg.  Take this twice a day for 1 week and then decrease to once a day.  Please take 1 hour around food. Try to practice a reflux avoidance diet by minimizing chocolate, citrus, caffeine, etc.  Do not eat after 8 PM and keep the head of your bed elevated if possible

## 2020-08-07 NOTE — Progress Notes (Signed)
Subjective:    Patient ID: Katrina Roman, female    DOB: 27-Jul-1976, 44 y.o.   MRN: [id number]  Asthma There is no cough, shortness of breath or wheezing. Pertinent negatives include no ear pain, fever, headaches, postnasal drip, rhinorrhea, sneezing, sore throat or trouble swallowing. Her past medical history is significant for asthma.   ROV 01/07/19 --Katrina Roman is 46, never smoker, with a history of allergic rhinitis, chronic cough and mild intermittent asthma.  She has GERD which also contributes to her chronic cough.  She is managed on Symbicort 80/4.5, uses albuterol rarely. Has nebs and HFA available. Since last time she has done very well since last time. She was treated once in the last year with abx for a sinus infection. She is on loratadine, flonase, [place]'s about every other day. She is on zantac - wants to change back to pantoprazole.   ROV 08/03/2019 --follow-up visit for 44 year old woman with allergic rhinitis and mild intermittent asthma.  She also has GERD and associated chronic cough.  We have been managing her on Symbicort 80/4.5.  Her allergy regimen is loratadine + xyzal, flonase prn. Saline rinses daily.  Treats her GERD with omeprazole qd. Exercise tolerance is good. Never needs albuterol - just got approved for ventolin via [place]. She has never had PNA shot.    ROV 08/07/20 --this is an annual follow-up visit for 44 year old woman, never smoker, with a history of allergic rhinitis, chronic cough and mild intermittent asthma.  Pulmonary function testing from 08/05/2014 reviewed by me, show possible mild obstruction with a borderline bronchodilator response, normal lung volumes, normal diffusion capacity.  She is currently managed on Symbicort 160/4.5, Singulair. She has lost wt.  On loratadine, Flonase which she uses as needed - not currently, nasal saline rinses  GERD treated with  She has been having more flares and labile symptoms requiring rounds of prednisone since I saw her 1 year  ago.  Most recently received in beginning of this month. She is having a globus sensation, a lot of throat clearing. Feels breakthrough GERD. A lot of dry cough. She hears wheeze at night when laying down, loses her voice. She has tried albuterol without much    Review of Systems  Constitutional: Negative for fever and unexpected weight change.  HENT: Negative for congestion, dental problem, ear pain, nosebleeds, postnasal drip, rhinorrhea, sinus pressure, sneezing, sore throat and trouble swallowing.   Eyes: Negative for redness and itching.  Respiratory: Negative for cough, chest tightness, shortness of breath and wheezing.   Cardiovascular: Negative for palpitations and leg swelling.  Gastrointestinal: Negative for nausea and vomiting.  Genitourinary: Negative for dysuria.  Musculoskeletal: Negative for joint swelling.  Skin: Negative for rash.  Neurological: Negative for headaches.  Hematological: Does not bruise/bleed easily.  Psychiatric/Behavioral: Negative for dysphoric mood. The patient is not nervous/anxious.        Objective:   Physical Exam Vitals:   08/07/20 1602  BP: 130/70  Pulse: 96  Temp: 98.6 F (37 C)  SpO2: 97%  Weight: 230 lb (104.3 kg)  Height: 5' 1"  (1.549 m)   Gen: Pleasant, overwt woman, in no distress,  normal affect  ENT: No lesions,  mouth clear,  oropharynx clear, no postnasal drip  Neck: No JVD, very mild insp and exp stridor  Lungs: No use of accessory muscles, no crackles or wheezing on normal respiration, no wheeze on forced expiration  Cardiovascular: RRR, heart sounds normal, no murmur or gallops, no peripheral edema  Musculoskeletal: No deformities, no cyanosis or clubbing  Neuro: alert, awake, non focal  Skin: Warm, no lesions or rash       Assessment & Plan:  Intrinsic asthma She has been flaring more frequently for the last year.  Based on her description she believes that it is mostly upper airway irritation as opposed to  bronchospasm.  In particular her symptoms have not always responded to albuterol.  Exacerbating factors appear to be allergic rhinitis, GERD.  We will work on treating both.  I like to repeat her pulmonary function testing to quantify her degree of obstruction but I want her upper airway irritation improved first.  I will also check an IgE, eosinophil count in the future  Continue Symbicort 160/4.5 mcg, 2 puffs twice a day for now.  We may modify this going forward.  Rinse and gargle after using. Keep albuterol available to use 2 puffs when you need if shortness breath, chest tightness, wheezing. We will probably repeat your pulmonary function testing going forward. We will also consider ordering lab work (IgE, eosinophil count) at a future visit Continue Singulair 10 mg each evening Try to avoid throat clearing if at all possible Follow with Dr Lamonte Sakai in 1 month or next available to discuss status with these changes  Allergic rhinitis Continue Singulair 10 mg each evening Continue loratadine 10 mg daily Add back your Flonase nasal spray, 2 sprays each nostril once daily. You might want to consider also adding back your daily nasal saline rinses if your allergies, cough, throat irritation continue   GERD Stop your omeprazole (Prilosec) Start pantoprazole 40 mg.  Take this twice a day for 1 week and then decrease to once a day.  Please take 1 hour around food. Try to practice a reflux avoidance diet by minimizing chocolate, citrus, caffeine, etc.  Do not eat after 8 PM and keep the head of your bed elevated if possible   Baltazar Apo, MD, PhD 08/07/2020, 4:52 PM [facility] [phone] or if no answer [phone]

## 2020-08-08 ENCOUNTER — Telehealth: Payer: 59 | Admitting: Physician Assistant

## 2020-08-08 DIAGNOSIS — J069 Acute upper respiratory infection, unspecified: Secondary | ICD-10-CM | POA: Diagnosis not present

## 2020-08-08 DIAGNOSIS — Z20822 Contact with and (suspected) exposure to covid-19: Secondary | ICD-10-CM

## 2020-08-08 MED ORDER — BENZONATATE 100 MG PO CAPS
100.0000 mg | ORAL_CAPSULE | Freq: Two times a day (BID) | ORAL | 0 refills | Status: DC | PRN
Start: 2020-08-08 — End: 2020-08-29

## 2020-08-08 MED ORDER — PREDNISONE 10 MG PO TABS
40.0000 mg | ORAL_TABLET | Freq: Every day | ORAL | 0 refills | Status: AC
Start: 2020-08-08 — End: 2020-08-13

## 2020-08-08 NOTE — Addendum Note (Signed)
Addended by: Madilyn Hook A on: 08/08/2020 01:40 PM   Modules accepted: Orders

## 2020-08-08 NOTE — Progress Notes (Signed)
E-Visit for Corona Virus Screening  Your current symptoms could be consistent with the coronavirus.  Many health care providers can now test patients at their office but not all are.  [place] has multiple testing sites. For information on our [place] testing locations and hours go to [email or web address]  We are enrolling you in our [place] for [place] . Daily you will receive a questionnaire within the [place] website. Our COVID 19 response team will be monitoring your responses daily.  Testing Information: The COVID-19 Community Testing sites will begin testing BY APPOINTMENT ONLY.  You can schedule online at [email or web address]  If you do not have access to a smart phone or computer you may call [phone] for an appointment.   Additional testing sites in the Community:  . For [facility] Testing sites in [facility]  [email or web address]  . For Pop-up testing sites in [place]  [email or web address]  . For Testing sites with regular hours [email or web address]  . For [place] [email or web address]  . For [facility] [email or web address]  . For [facility] testing in [place] and [employer] [email or web address]  . For [facility] testing in [facility]   [email or web address]  For  more information about community testing call [phone]   Please quarantine yourself while awaiting your test results. Please stay home for a minimum of 10 days from the first day of illness with improving symptoms and you have had 24 hours of no fever (without the use of Tylenol (Acetaminophen)  Motrin (Ibuprofen) or any fever reducing medication).  Also - Do not get tested prior to returning to work because once you have had a positive test the test can stay positive for more then a month in some cases.   You should wear a mask or cloth face covering over your nose and mouth if you must be around other people or animals, including pets (even at home). Try to stay at least 6 feet away from other people. This will protect the people around you.  Please continue good preventive care measures, including:  frequent hand-washing, avoid touching your face, cover coughs/sneezes, stay out of crowds and keep a 6 foot distance from others.  COVID-19 is a respiratory illness with symptoms that are similar to the flu. Symptoms are typically mild to moderate, but there have been cases of severe illness and death due to the virus.   The following symptoms may appear 2-14 days after exposure: . Fever . Cough . Shortness of breath or difficulty breathing . Chills . Repeated shaking with chills . Muscle pain . Headache . Sore throat . New loss of taste or smell . Fatigue . Congestion or runny nose . Nausea or vomiting . Diarrhea  Go to the nearest hospital ED for assessment if fever/cough/breathlessness are severe or illness seems like a threat to life.  It is vitally important that if you feel that you have an infection such as this virus or any other virus that you stay home and away from places where you may spread it to others.  You should avoid contact with people age 40 and older.   You can use medication such as A prescription cough medication called Tessalon Perles 100 mg. You may take 1-2 capsules every 8 hours as needed for cough  You may also take acetaminophen (Tylenol) as needed for fever.  Reduce your risk of any infection by using the same precautions used for avoiding the common cold or flu:  Marland Kitchen Wash your hands  often with soap and warm water for at least 20 seconds.  If soap and  water are not readily available, use an alcohol-based hand sanitizer with at least 60% alcohol.  . If coughing or sneezing, cover your mouth and nose by coughing or sneezing into the elbow areas of your shirt or coat, into a tissue or into your sleeve (not your hands). . Avoid shaking hands with others and consider head nods or verbal greetings only. . Avoid touching your eyes, nose, or mouth with unwashed hands.  . Avoid close contact with people who are sick. . Avoid places or events with large numbers of people in one location, like concerts or sporting events. . Carefully consider travel plans you have or are making. . If you are planning any travel outside or inside the [place], visit the CDC's Travelers' Health webpage for the latest health notices. . If you have some symptoms but not all symptoms, continue to monitor at home and seek medical attention if your symptoms worsen. . If you are having a medical emergency, call 911.  HOME CARE . Only take medications as instructed by your medical team. . Drink plenty of fluids and get plenty of rest. . A steam or ultrasonic humidifier can help if you have congestion.   GET HELP RIGHT AWAY IF YOU HAVE EMERGENCY WARNING SIGNS** FOR COVID-19. If you or someone is showing any of these signs seek emergency medical care immediately. Call 911 or proceed to your closest emergency facility if: . You develop worsening high fever. . Trouble breathing . Bluish lips or face . Persistent pain or pressure in the chest . New confusion . Inability to wake or stay awake . You cough up blood. . Your symptoms become more severe  **This list is not all possible symptoms. Contact your medical provider for any symptoms that are sever or concerning to you.  MAKE SURE YOU   Understand these instructions.  Will watch your condition.  Will get help right away if you are not doing well or get worse.  Your e-visit answers were reviewed by a board certified  advanced clinical practitioner to complete your personal care plan.  Depending on the condition, your plan could have included both over the counter or prescription medications.  If there is a problem please reply once you have received a response from your provider.  Your safety is important to [place].  If you have drug allergies check your prescription carefully.    You can use MyChart to ask questions about today's visit, request a non-urgent call back, or ask for a work or school excuse for 24 hours related to this e-Visit. If it has been greater than 24 hours you will need to follow up with your provider, or enter a new e-Visit to address those concerns. You will get an e-mail in the next two days asking about your experience.  I hope that your e-visit has been valuable and will speed your recovery. Thank you for using e-visits.    5 minutes on chart

## 2020-08-14 MED ORDER — ALBUTEROL SULFATE HFA 108 (90 BASE) MCG/ACT IN AERS: 2.0000 | INHALATION_SPRAY | Freq: Four times a day (QID) | RESPIRATORY_TRACT | 3 refills | Status: DC | PRN

## 2020-08-20 ENCOUNTER — Other Ambulatory Visit: Payer: Self-pay | Admitting: Emergency Medicine

## 2020-08-23 ENCOUNTER — Other Ambulatory Visit: Payer: Self-pay | Admitting: Emergency Medicine

## 2020-08-23 MED ORDER — ALBUTEROL SULFATE HFA 108 (90 BASE) MCG/ACT IN AERS: 2.0000 | INHALATION_SPRAY | Freq: Four times a day (QID) | RESPIRATORY_TRACT | 6 refills | Status: DC | PRN

## 2020-08-29 ENCOUNTER — Telehealth: Payer: 59 | Admitting: Physician Assistant

## 2020-08-29 DIAGNOSIS — J069 Acute upper respiratory infection, unspecified: Secondary | ICD-10-CM | POA: Diagnosis not present

## 2020-08-29 MED ORDER — PREDNISONE 5 MG PO TABS
5.0000 mg | ORAL_TABLET | Freq: Every day | ORAL | 0 refills | Status: DC
Start: 2020-08-29 — End: 2020-08-29

## 2020-08-29 MED ORDER — PREDNISONE 20 MG PO TABS: 20.0000 mg | ORAL_TABLET | Freq: Every day | ORAL | 0 refills | Status: AC

## 2020-08-29 MED ORDER — AZITHROMYCIN 250 MG PO TABS
250.0000 mg | ORAL_TABLET | Freq: Every day | ORAL | 0 refills | Status: DC
Start: 2020-08-29 — End: 2020-08-29

## 2020-08-29 MED ORDER — AZITHROMYCIN 250 MG PO TABS: 250.0000 mg | ORAL_TABLET | Freq: Every day | ORAL | 0 refills | Status: DC

## 2020-08-29 MED ORDER — BENZONATATE 100 MG PO CAPS
100.0000 mg | ORAL_CAPSULE | Freq: Three times a day (TID) | ORAL | 0 refills | Status: DC
Start: 2020-08-29 — End: 2020-08-29

## 2020-08-29 MED ORDER — DOXYCYCLINE HYCLATE 100 MG PO CAPS: 100.0000 mg | ORAL_CAPSULE | Freq: Two times a day (BID) | ORAL | 0 refills | Status: AC

## 2020-08-29 NOTE — Progress Notes (Signed)
We are sorry that you are not feeling well.  Here is how we plan to help!  Based on your presentation I believe you most likely have A cough due to bacteria.  When patients have a fever and a productive cough with a change in color or increased sputum production, we are concerned about bacterial bronchitis.  If left untreated it can progress to pneumonia.  If your symptoms do not improve with your treatment plan it is important that you contact your provider.   I have prescribed Azithromyin 250 mg: two tablets now and then one tablet daily for 4 additonal days    In addition you may use A prescription cough medication called Tessalon Perles 124m. You may take 1-2 capsules every 8 hours as needed for your cough.  Prednisone 5 mg daily for 6 days (see taper instructions below)  From your responses in the eVisit questionnaire you describe inflammation in the upper respiratory tract which is causing a significant cough.  This is commonly called Bronchitis and has four common causes:    Allergies  Viral Infections  Acid Reflux  Bacterial Infection Allergies, viruses and acid reflux are treated by controlling symptoms or eliminating the cause. An example might be a cough caused by taking certain blood pressure medications. You stop the cough by changing the medication. Another example might be a cough caused by acid reflux. Controlling the reflux helps control the cough.  USE OF BRONCHODILATOR ("RESCUE") INHALERS: There is a risk from using your bronchodilator too frequently.  The risk is that over-reliance on a medication which only relaxes the muscles surrounding the breathing tubes can reduce the effectiveness of medications prescribed to reduce swelling and congestion of the tubes themselves.  Although you feel brief relief from the bronchodilator inhaler, your asthma may actually be worsening with the tubes becoming more swollen and filled with mucus.  This can delay other crucial treatments,  such as oral steroid medications. If you need to use a bronchodilator inhaler daily, several times per day, you should discuss this with your provider.  There are probably better treatments that could be used to keep your asthma under control.     HOME CARE . Only take medications as instructed by your medical team. . Complete the entire course of an antibiotic. . Drink plenty of fluids and get plenty of rest. . Avoid close contacts especially the very young and the elderly . Cover your mouth if you cough or cough into your sleeve. . Always remember to wash your hands . A steam or ultrasonic humidifier can help congestion.   GET HELP RIGHT AWAY IF: . You develop worsening fever. . You become short of breath . You cough up blood. . Your symptoms persist after you have completed your treatment plan MAKE SURE YOU   Understand these instructions.  Will watch your condition.  Will get help right away if you are not doing well or get worse.  Your e-visit answers were reviewed by a board certified advanced clinical practitioner to complete your personal care plan.  Depending on the condition, your plan could have included both over the counter or prescription medications. If there is a problem please reply  once you have received a response from your provider. Your safety is important to [place]  If you have drug allergies check your prescription carefully.    You can use MyChart to ask questions about today's visit, request a non-urgent call back, or ask for a work or school excuse  for 24 hours related to this e-Visit. If it has been greater than 24 hours you will need to follow up with your provider, or enter a new e-Visit to address those concerns. You will get an e-mail in the next two days asking about your experience.  I hope that your e-visit has been valuable and will speed your recovery. Thank you for using e-visits.  Greater than 5 minutes, yet less than 10 minutes of time have been  spend researching, coordinating, and implementing care for this patient today.

## 2020-08-29 NOTE — Addendum Note (Signed)
Addended by: Rodney Booze on: 08/29/2020 12:28 PM   Modules accepted: Orders

## 2020-08-29 NOTE — Addendum Note (Signed)
Addended by: Rodney Booze on: 08/29/2020 10:32 AM   Modules accepted: Orders

## 2020-08-29 NOTE — Addendum Note (Signed)
Addended by: Rodney Booze on: 08/29/2020 01:14 PM   Modules accepted: Orders

## 2020-08-30 NOTE — Telephone Encounter (Signed)
Dr. Lamonte Sakai please advise on patient mychart message   Dr. Lamonte Sakai sent in a RX refill to my mail order pharmacy a few weeks ago for my rescue inhaler.   The inhaler they sent me is for Albuterol Sulfate 69mg.  It is white with an orange cap.   My usual inhaler is for Albuterol Sulfate 971m, but it is a grayish blue, with a blue cap.  I am concerned that the white one won't be as effective.   They sent me a generic substitute last year and it did not work at all for me.   Dr. ByLamonte Sakaiad to physically tell them to NOT send a substitute.   Can he do that again?  I want/need my usual inhaler.  I am willing to pay more for it since I know it works.

## 2020-08-31 MED ORDER — ALBUTEROL SULFATE HFA 108 (90 BASE) MCG/ACT IN AERS: 2.0000 | INHALATION_SPRAY | Freq: Four times a day (QID) | RESPIRATORY_TRACT | 3 refills | Status: DC | PRN

## 2020-08-31 NOTE — Telephone Encounter (Signed)
It sounds like she prefers Ventolin to the generic. OK with me to change the order to insure she gets name-brand. Thanks.

## 2020-09-11 ENCOUNTER — Encounter: Payer: Self-pay | Admitting: Emergency Medicine

## 2020-09-11 ENCOUNTER — Other Ambulatory Visit: Payer: Self-pay

## 2020-09-11 ENCOUNTER — Ambulatory Visit: Payer: 59 | Admitting: Emergency Medicine

## 2020-09-11 DIAGNOSIS — J45909 Unspecified asthma, uncomplicated: Secondary | ICD-10-CM

## 2020-09-11 DIAGNOSIS — R05 Cough: Secondary | ICD-10-CM

## 2020-09-11 DIAGNOSIS — R059 Cough, unspecified: Secondary | ICD-10-CM

## 2020-09-11 MED ORDER — FLUTICASONE PROPIONATE 50 MCG/ACT NA SUSP: 1.0000 | Freq: Every day | NASAL | 2 refills | Status: DC

## 2020-09-11 MED ORDER — CLARITIN-D 24 HOUR 10-240 MG PO TB24: 1.0000 | ORAL_TABLET | Freq: Every day | ORAL | 5 refills | Status: DC

## 2020-09-11 NOTE — Addendum Note (Signed)
Addended by: Gavin Potters R on: 09/11/2020 04:03 PM   Modules accepted: Orders

## 2020-09-11 NOTE — Addendum Note (Signed)
Addended by: Suzzanne Cloud E on: 09/11/2020 03:54 PM   Modules accepted: Orders

## 2020-09-11 NOTE — Assessment & Plan Note (Signed)
She has not seem to missed the Symbicort and I am concerned that it might be a contributor upper airway irritation.  We will stay off of it for now.  She will keep albuterol available to use if she needs it.

## 2020-09-11 NOTE — Addendum Note (Signed)
Addended by: Gavin Potters R on: 09/11/2020 03:52 PM   Modules accepted: Orders

## 2020-09-11 NOTE — Patient Instructions (Addendum)
Lab work today (IgE, CBC with differential) We will hold off on restarting Symbicort for now. Keep your albuterol available to use 2 puffs if needed for shortness of breath, chest tightness, wheezing. Continue Singulair 10 mg each evening Continue nasal saline rinses once daily Start fluticasone nasal spray 2 sprays each nostril once daily Start Claritin-D or Claritin-DM once daily (loratadine-D, loratadine-DM).  Once we had improvement in her symptoms were probably changed to just plain loratadine. Continue pantoprazole 40 mg once daily. Try your best to avoid throat clearing Use your Tessalon (benzonatate) Perles after every 6 hours if needed to suppress your cough We will refer you to Allergy for possible skin testing. Depending on how things progress we may decide to consider bronchoscopy to visualize her upper airway. Follow with Dr Lamonte Sakai in 1 month or next available

## 2020-09-11 NOTE — Assessment & Plan Note (Signed)
With upper airway irritation and rawness.  Her GERD is better with the pantoprazole.  Still has drainage and throat irritation.  Talk to her today about trying to avoid throat clearing.  She also needs cough suppression.  We will need to maximizeTreatment of her rhinitis, allergic symptoms.  Check an IgE, eosinophil count today.  I will refer her to allergy for skin testing.  Try to ramp up her medical regimen.  This appears to be an annual problem for her in the fall months.  If persisting despite the above then she likely needs bronchoscopy for airway evaluation.  Lab work today (IgE, CBC with differential) We will hold off on restarting Symbicort for now. Keep your albuterol available to use 2 puffs if needed for shortness of breath, chest tightness, wheezing. Continue Singulair 10 mg each evening Continue nasal saline rinses once daily Start fluticasone nasal spray 2 sprays each nostril once daily Start Claritin-D or Claritin-DM once daily (loratadine-D, loratadine-DM).  Once we had improvement in her symptoms were probably changed to just plain loratadine. Continue pantoprazole 40 mg once daily. Try your best to avoid throat clearing Use your Tessalon (benzonatate) Perles after every 6 hours if needed to suppress your cough We will refer you to Allergy for possible skin testing. Depending on how things progress we may decide to consider bronchoscopy to visualize her upper airway. Follow with Dr Lamonte Sakai in 1 month or next available

## 2020-09-11 NOTE — Progress Notes (Signed)
Subjective:    Patient ID: Katrina Roman, female    DOB: [birth year], 44 y.o.   MRN: [id number]  Asthma There is no cough, shortness of breath or wheezing. Pertinent negatives include no ear pain, fever, headaches, postnasal drip, rhinorrhea, sneezing, sore throat or trouble swallowing. Her past medical history is significant for asthma.   ROV 01/07/19 --Katrina Roman is 44, never smoker, with a history of allergic rhinitis, chronic cough and mild intermittent asthma.  She has GERD which also contributes to her chronic cough.  She is managed on Symbicort 80/4.5, uses albuterol rarely. Has nebs and HFA available. Since last time she has done very well since last time. She was treated once in the last year with abx for a sinus infection. She is on loratadine, flonase, NSW's about every other day. She is on zantac - wants to change back to pantoprazole.   ROV 08/03/2019 --follow-up visit for 44 year old woman with allergic rhinitis and mild intermittent asthma.  She also has GERD and associated chronic cough.  We have been managing her on Symbicort 80/4.5.  Her allergy regimen is loratadine + xyzal, flonase prn. Saline rinses daily.  Treats her GERD with omeprazole qd. Exercise tolerance is good. Never needs albuterol - just got approved for ventolin via [place]. She has never had PNA shot.    ROV 08/07/20 --this is an annual follow-up visit for 44 year old woman, never smoker, with a history of allergic rhinitis, chronic cough and mild intermittent asthma.  Pulmonary function testing from 08/05/2014 reviewed by me, show possible mild obstruction with a borderline bronchodilator response, normal lung volumes, normal diffusion capacity.  She is currently managed on Symbicort 160/4.5, Singulair. She has lost wt.  On loratadine, Flonase which she uses as needed - not currently, nasal saline rinses  She has been having more flares and labile symptoms requiring rounds of prednisone since I saw her 1 year ago.  Most recently  received in beginning of this month. She is having a globus sensation, a lot of throat clearing. Feels breakthrough GERD. A lot of dry cough. She hears wheeze at night when laying down, loses her voice. She has tried albuterol without much   ROV 09/11/20 --44 year old woman, never smoker whom I have followed for allergic rhinitis, mild admission asthma, chronic cough.  I saw her last month and she was having more symptoms, throat irritation, cough.  We continued Singulair, were going to add back Flonase nasal spray but did not do.  Instead added nasal saline rinses and started pantoprazole to see if she would get benefit. She temporarily stopped Symbicort last time.  Today she reports that her GERD is better, but her UA is still raw, lots of throat clearing, barky dry cough.    Review of Systems  Constitutional: Negative for fever and unexpected weight change.  HENT: Negative for congestion, dental problem, ear pain, nosebleeds, postnasal drip, rhinorrhea, sinus pressure, sneezing, sore throat and trouble swallowing.   Eyes: Negative for redness and itching.  Respiratory: Negative for cough, chest tightness, shortness of breath and wheezing.   Cardiovascular: Negative for palpitations and leg swelling.  Gastrointestinal: Negative for nausea and vomiting.  Genitourinary: Negative for dysuria.  Musculoskeletal: Negative for joint swelling.  Skin: Negative for rash.  Neurological: Negative for headaches.  Hematological: Does not bruise/bleed easily.  Psychiatric/Behavioral: Negative for dysphoric mood. The patient is not nervous/anxious.        Objective:   Physical Exam Vitals:   09/11/20 1523  BP: 136/76  Pulse: 99  Temp: 98.2 F (36.8 C)  TempSrc: Temporal  SpO2: 97%  Weight: 230 lb (104.3 kg)  Height: 5' 1"  (1.549 m)   Gen: Pleasant, overwt woman, in no distress,  normal affect  ENT: No lesions,  mouth clear,  oropharynx clear, no postnasal drip  Neck: No JVD, loud  stridor  Lungs: No use of accessory muscles, no crackles or wheezing on normal respiration, no wheeze on forced expiration, referred UA noise B  Cardiovascular: RRR, heart sounds normal, no murmur or gallops, no peripheral edema  Musculoskeletal: No deformities, no cyanosis or clubbing  Neuro: alert, awake, non focal  Skin: Warm, no lesions or rash       Assessment & Plan:  Cough With upper airway irritation and rawness.  Her GERD is better with the pantoprazole.  Still has drainage and throat irritation.  Talk to her today about trying to avoid throat clearing.  She also needs cough suppression.  We will need to maximizeTreatment of her rhinitis, allergic symptoms.  Check an IgE, eosinophil count today.  I will refer her to allergy for skin testing.  Try to ramp up her medical regimen.  This appears to be an annual problem for her in the fall months.  If persisting despite the above then she likely needs bronchoscopy for airway evaluation.  Lab work today (IgE, CBC with differential) We will hold off on restarting Symbicort for now. Keep your albuterol available to use 2 puffs if needed for shortness of breath, chest tightness, wheezing. Continue Singulair 10 mg each evening Continue nasal saline rinses once daily Start fluticasone nasal spray 2 sprays each nostril once daily Start Claritin-D or Claritin-DM once daily (loratadine-D, loratadine-DM).  Once we had improvement in her symptoms were probably changed to just plain loratadine. Continue pantoprazole 40 mg once daily. Try your best to avoid throat clearing Use your Tessalon (benzonatate) Perles after every 6 hours if needed to suppress your cough We will refer you to Allergy for possible skin testing. Depending on how things progress we may decide to consider bronchoscopy to visualize her upper airway. Follow with Dr Lamonte Sakai in 1 month or next available  Intrinsic asthma She has not seem to missed the Symbicort and I am  concerned that it might be a contributor upper airway irritation.  We will stay off of it for now.  She will keep albuterol available to use if she needs it.   Baltazar Apo, MD, PhD 09/11/2020, 3:48 PM [facility] [phone] or if no answer [phone]

## 2020-09-12 LAB — CBC WITH DIFFERENTIAL/PLATELET
Basophils Absolute: 0.1 10*3/uL (ref 0.0–0.1)
Basophils Relative: 1.1 % (ref 0.0–3.0)
Eosinophils Absolute: 0.8 10*3/uL — ABNORMAL HIGH (ref 0.0–0.7)
Eosinophils Relative: 7.7 % — ABNORMAL HIGH (ref 0.0–5.0)
HCT: 43 % (ref 36.0–46.0)
Hemoglobin: 14 g/dL (ref 12.0–15.0)
Lymphocytes Relative: 28.8 % (ref 12.0–46.0)
Lymphs Abs: 3.2 10*3/uL (ref 0.7–4.0)
MCHC: 32.6 g/dL (ref 30.0–36.0)
MCV: 93.3 fl (ref 78.0–100.0)
Monocytes Absolute: 1 10*3/uL (ref 0.1–1.0)
Monocytes Relative: 9.3 % (ref 3.0–12.0)
Neutro Abs: 5.8 10*3/uL (ref 1.4–7.7)
Neutrophils Relative %: 53.1 % (ref 43.0–77.0)
Platelets: 300 10*3/uL (ref 150.0–400.0)
RBC: 4.61 Mil/uL (ref 3.87–5.11)
RDW: 13.9 % (ref 11.5–15.5)
WBC: 11 10*3/uL — ABNORMAL HIGH (ref 4.0–10.5)

## 2020-09-12 LAB — IGE: IgE (Immunoglobulin E), Serum: 5 kU/L (ref <=114)

## 2020-09-12 NOTE — Progress Notes (Signed)
You saw pt yesterday

## 2020-09-12 NOTE — Progress Notes (Signed)
You saw Katrina Roman yesterday

## 2020-09-13 ENCOUNTER — Telehealth: Payer: Self-pay | Admitting: Emergency Medicine

## 2020-09-13 MED ORDER — MONTELUKAST SODIUM 10 MG PO TABS: ORAL_TABLET | ORAL | 3 refills | Status: DC

## 2020-09-13 NOTE — Telephone Encounter (Signed)
Her eosinophil count is elevated, which is consistent active allergies and with her prior labs. The IgE is actually low which is good news. She should continue the meds we described and follow up as planned to see if we make any impact

## 2020-09-13 NOTE — Telephone Encounter (Signed)
Sent in a separate message.

## 2020-09-13 NOTE — Telephone Encounter (Signed)
Patient requesting results from lab work, CBC and Ige. Please advise.

## 2020-09-13 NOTE — Telephone Encounter (Signed)
She saw Dr. Lamonte Sakai two days ago, can you forward to him. As far as I can tell nothing acutely concerning. Ok to keep apt in October

## 2020-09-13 NOTE — Telephone Encounter (Signed)
Returned patient's call, she states she saw Dr. Lamonte Sakai on Monday 09/11/20, she had labwork drawn and her medications were changed.  She was told her WBC level was high and her eosinophils were high as well.  She states she is not feeling any better since the changes with her medications and is wondering if she needs to be seen prior to 10/05/20 or if any changes need to be made to her current medications.  Dr. Lamonte Sakai, please advise.  Thank you.

## 2020-09-13 NOTE — Telephone Encounter (Signed)
Called and spoke with pt letting her know the info stated by RB and she verbalized understanding. While speaking with pt, pt said she tried to get a refill of her montelukast at the pharmacy but the Rx was out of refills. I have taken care of refilling med for pt sending it to preferred pharmacy. Nothing further needed.

## 2020-09-16 ENCOUNTER — Other Ambulatory Visit: Payer: Self-pay | Admitting: Emergency Medicine

## 2020-09-19 ENCOUNTER — Telehealth: Payer: 59 | Admitting: Physician Assistant

## 2020-09-19 DIAGNOSIS — E782 Mixed hyperlipidemia: Secondary | ICD-10-CM | POA: Insufficient documentation

## 2020-09-19 DIAGNOSIS — J069 Acute upper respiratory infection, unspecified: Secondary | ICD-10-CM

## 2020-09-19 DIAGNOSIS — R030 Elevated blood-pressure reading, without diagnosis of hypertension: Secondary | ICD-10-CM | POA: Insufficient documentation

## 2020-09-19 HISTORY — DX: Mixed hyperlipidemia: E78.2

## 2020-09-19 MED ORDER — PREDNISONE 10 MG PO TABS: ORAL_TABLET | ORAL | 0 refills | Status: DC

## 2020-09-19 NOTE — Progress Notes (Signed)
Based on what you shared with me, I feel your condition warrants further evaluation and I recommend that you be seen for a face to face office visit.  I have concerns about the change in your cough and other symptoms.  You may need to be tested for COVID and/or need a chest x-ray depending on your physical exam.   NOTE: If you entered your credit card information for this eVisit, you will not be charged. You may see a "hold" on your card for the $35 but that hold will drop off and you will not have a charge processed.   If you are having a true medical emergency please call 911.      For an urgent face to face visit, [facility] has five urgent care centers for your convenience:      NEW:  [facility] [place] [facility] [place] Get Driving Directions [phone] [address], [place] [address]  10 am - 6pm Monday - Friday    [place] [facility]) Get Driving Directions [phone] [address] [place], [place] [address]  10 am to 8 pm Monday-Friday  12 pm to 8 pm [facility] at [facility] Get Driving Directions [phone] [address], [place], [place] [address]  8 am to 8 pm Monday-Friday  9 am to 6 pm Saturday  11 am to 6 pm Sunday     [facility] Get Driving Directions  [phone] [address] [place], [place] [address]  8 am to 8 pm Monday-Friday  8 am to 4 pm [facility] at [place] Get Driving Directions [phone] [address] [place], [place] [address]  12 pm to 6 pm Monday-Friday      Your e-visit answers were reviewed by a board certified advanced clinical practitioner to complete your personal care plan.  Thank you for using e-Visits.   Greater than 5 minutes, yet less than 10 minutes of time have been spent researching, coordinating, and  implementing care for this patient today

## 2020-09-19 NOTE — Telephone Encounter (Signed)
I am ok with treating w pred >> Take 6m daily for 3 days, then 397mdaily for 3 days, then 2067maily for 3 days, then 82m60mily for 3 days, then stop

## 2020-09-22 NOTE — Telephone Encounter (Signed)
Patient sent a message stating that she has been in the hospital since October 5th for pneumonia.

## 2020-09-25 MED ORDER — VENTOLIN HFA 108 (90 BASE) MCG/ACT IN AERS: 2.0000 | INHALATION_SPRAY | Freq: Four times a day (QID) | RESPIRATORY_TRACT | 3 refills | Status: DC | PRN

## 2020-09-25 NOTE — Addendum Note (Signed)
Addended by: Desmond Dike C on: 09/25/2020 01:56 PM   Modules accepted: Orders

## 2020-10-05 ENCOUNTER — Ambulatory Visit: Payer: 59 | Admitting: Emergency Medicine

## 2020-10-05 ENCOUNTER — Other Ambulatory Visit: Payer: Self-pay

## 2020-10-05 ENCOUNTER — Encounter: Payer: Self-pay | Admitting: Emergency Medicine

## 2020-10-05 DIAGNOSIS — R0683 Snoring: Secondary | ICD-10-CM | POA: Diagnosis not present

## 2020-10-05 DIAGNOSIS — G4733 Obstructive sleep apnea (adult) (pediatric): Secondary | ICD-10-CM | POA: Insufficient documentation

## 2020-10-05 DIAGNOSIS — J45909 Unspecified asthma, uncomplicated: Secondary | ICD-10-CM | POA: Diagnosis not present

## 2020-10-05 NOTE — Telephone Encounter (Signed)
Dr. Lamonte Sakai, please see mychart message sent by pt and advise.

## 2020-10-05 NOTE — Telephone Encounter (Signed)
I'm sorry, but I';m not comfortable writing for that class of medications.

## 2020-10-05 NOTE — Assessment & Plan Note (Signed)
Complicated case.  She likely does have some true airflow obstruction but much of her acute symptoms relate to upper airway disease.  She is working to treat GERD and allergic rhinitis.  I have asked her to see allergy to consider skin testing.  We will continue her current regimen, continue albuterol as needed.  I will hold off on ordering a maintenance bronchodilator/ICS because she has had Worsening of her upper airway irritation on these medications before.  There may be some benefit to diagnosing/treating sleep apnea as well.

## 2020-10-05 NOTE — Assessment & Plan Note (Signed)
Moderate suspicion for obstructive sleep apnea.  This may actually contribute to upper airway irritation and chronic inflammation.  We will arrange for a polysomnogram, optimally a split-night sleep study.  We will obtain a home study if this is preferred by her insurance company.

## 2020-10-05 NOTE — Addendum Note (Signed)
Addended by: Gavin Potters R on: 10/05/2020 04:49 PM   Modules accepted: Orders

## 2020-10-05 NOTE — Progress Notes (Signed)
Subjective:    Patient ID: Katrina Roman, female    DOB: Jan 18, 1976, 44 y.o.   MRN: [id number]  Asthma There is no cough, shortness of breath or wheezing. Pertinent negatives include no ear pain, fever, headaches, postnasal drip, rhinorrhea, sneezing, sore throat or trouble swallowing. Her past medical history is significant for asthma.    ROV 09/11/20 --44 year old woman, never smoker whom I have followed for allergic rhinitis, mild admission asthma, chronic cough.  I saw her last month and she was having more symptoms, throat irritation, cough.  We continued Singulair, were going to add back Flonase nasal spray but did not do.  Instead added nasal saline rinses and started pantoprazole to see if she would get benefit. She temporarily stopped Symbicort last time.  Today she reports that her GERD is better, but her UA is still raw, lots of throat clearing, barky dry cough.   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  She has impact from GERD and chronic rhinitis.  At her last visit we talked about referral for allergy skin testing.  IgE 09/11/2020 was low, eosinophil count slightly elevated at 800/mcL.  She was seen in the emergency department 09/19/2020 with apparent bronchospasm, describes acute onset increased cough and wheezing, unresponsive to her BD's. She required BiPAP, treated with abx and steroids. Finished prednisone. Has albuterol prn.    Review of Systems  Constitutional: Negative for fever and unexpected weight change.  HENT: Negative for congestion, dental problem, ear pain, nosebleeds, postnasal drip, rhinorrhea, sinus pressure, sneezing, sore throat and trouble swallowing.   Eyes: Negative for redness and itching.  Respiratory: Negative for cough, chest tightness, shortness of breath and wheezing.   Cardiovascular: Negative for palpitations and leg swelling.   Gastrointestinal: Negative for nausea and vomiting.  Genitourinary: Negative for dysuria.  Musculoskeletal: Negative for joint swelling.  Skin: Negative for rash.  Neurological: Negative for headaches.  Hematological: Does not bruise/bleed easily.  Psychiatric/Behavioral: Negative for dysphoric mood. The patient is not nervous/anxious.        Objective:   Physical Exam Vitals:   10/05/20 1414  BP: 128/82  Pulse: (!) 105  Temp: 97.8 F (36.6 C)  TempSrc: Temporal  SpO2: 95%  Weight: 230 lb 12.8 oz (104.7 kg)  Height: 5' 1"  (1.549 m)   Gen: Pleasant, overwt woman, in no distress,  normal affect  ENT: No lesions,  mouth clear,  oropharynx clear, no postnasal drip  Neck: No JVD, no stridor today  Lungs: No use of accessory muscles, no crackles or wheezing on normal respiration, no wheeze on forced expiration,  Cardiovascular: RRR, heart sounds normal, no murmur or gallops, no peripheral edema  Musculoskeletal: No deformities, no cyanosis or clubbing  Neuro: alert, awake, non focal  Skin: Warm, no lesions or rash       Assessment & Plan:  Intrinsic asthma Complicated case.  She likely does have some true airflow obstruction but much of her acute symptoms relate to upper airway disease.  She is working to treat GERD and allergic rhinitis.  I have asked her to see allergy to consider skin testing.  We will continue her current regimen, continue albuterol as needed.  I will hold off on ordering a maintenance bronchodilator/ICS because she has had Worsening of her upper airway irritation on these medications before.  There may be some benefit to diagnosing/treating sleep apnea as well.  Snoring Moderate  suspicion for obstructive sleep apnea.  This may actually contribute to upper airway irritation and chronic inflammation.  We will arrange for a polysomnogram, optimally a split-night sleep study.  We will obtain a home study if this is preferred by her insurance  company.   Baltazar Apo, MD, PhD 10/05/2020, 2:37 PM [facility] [phone] or if no answer [phone]

## 2020-10-05 NOTE — Patient Instructions (Signed)
Continue loratadine, fluticasone nasal spray, Singulair, Protonix as you have been taking them. Keep your albuterol available use 2 puffs when needed for shortness of breath, chest tightness, wheezing. We will hold off on restarting a scheduled inhaled medicine for now.  We may decide to consider going forward Follow-up with allergy as planned to consider possible skin testing. We will arrange for a sleep study and review the results next visit Follow with Dr Lamonte Sakai in December, sooner if you have any problems

## 2020-10-10 NOTE — Telephone Encounter (Signed)
Please advise on patient mychart message. Patient has been informed that you are out of office till 10/16/20  Can I get something in writing from Dr Lamonte Sakai showing how long I have been a patient and that I am still seeing him for my asthma?

## 2020-10-16 NOTE — Telephone Encounter (Signed)
It looks like I have seen her since 05/2014. OK to write a letter that states what she needs.

## 2020-10-17 ENCOUNTER — Encounter: Payer: Self-pay | Admitting: *Deleted

## 2020-10-19 ENCOUNTER — Telehealth: Payer: Self-pay | Admitting: Emergency Medicine

## 2020-10-19 MED ORDER — VENTOLIN HFA 108 (90 BASE) MCG/ACT IN AERS: 2.0000 | INHALATION_SPRAY | Freq: Four times a day (QID) | RESPIRATORY_TRACT | 3 refills | Status: DC | PRN

## 2020-10-19 MED ORDER — PANTOPRAZOLE SODIUM 40 MG PO TBEC
40.0000 mg | DELAYED_RELEASE_TABLET | Freq: Every day | ORAL | 3 refills | Status: DC
Start: 2020-10-19 — End: 2021-06-06

## 2020-10-19 MED ORDER — MONTELUKAST SODIUM 10 MG PO TABS: ORAL_TABLET | ORAL | 3 refills | Status: DC

## 2020-10-19 NOTE — Telephone Encounter (Signed)
Rx refills were sent to [facility]

## 2020-10-24 NOTE — Progress Notes (Signed)
New Patient Note  RE: Katrina Roman MRN: [id number] DOB: [birth year] Date of Office Visit: 10/25/2020  Referring provider: Collene Gobble, MD Primary care provider: Pcp, No  Chief Complaint: Asthma (October 4 hospital for 5 days said asthma and double pneumonia) and Allergic Rhinitis  (Medication seems to help. When she's sick she takes extra)  History of Present Illness: I had the pleasure of seeing Katrina Roman for initial evaluation at the [facility] on 10/25/2020. She is a 44 y.o. female, who is referred here by Pulmonology - Dr. Lamonte Sakai for the evaluation of allergic rhinitis.  Rhinitis: She reports symptoms of sneezing, itchy eyes. Symptoms have been going on for 20+ years. The symptoms are present mainly during the fall. Other triggers include exposure to unknown. Anosmia: no. Headache: no. She has used Claritin, Xyzal and saline rinse with good improvement in symptoms. Sinus infections: no. Previous work up includes: skin testing in the 1990s was positive to grass, trees, mold per patient report. No prior allergy injections.  Previous ENT evaluation: not recently. History of nasal polyps: yes and polypectomy twice in the 1990s and in 2000s. Last eye exam: last year. History of reflux: yes and takes Protonix 3m daily.  Asthma - managed by pulmonology. Usually has a flare every October.  She reports symptoms of chest tightness, shortness of breath, coughing, wheezing, nocturnal awakenings for 20+ years. Current medications include albuterol HFA and nebulizer which help. She takes Singulair daily. She reports using aerochamber with inhalers. She tried the following inhalers: Symbicort but it caused throat irritation. She tried other various inhalers in the past as well. Main triggers are October. In the last month, frequency of symptoms: <1x/week. Frequency of nocturnal symptoms: 0x/month. Frequency of SABA use: <1x/week. Interference with physical activity: no. In the  last 12 months, emergency room visits/urgent care visits/doctor office visits or hospitalizations due to respiratory issues: 1. In the last 12 months, oral steroids courses: 1. Lifetime history of hospitalization for respiratory issues: yes recently in October. Prior intubations: no. History of pneumonia: yes. She was evaluated by allergist/pulmonologist in the past. Smoking exposure: denies. Up to date with flu vaccine: not yet. Up to date with COVID-19 vaccine: no.   Component     Latest Ref Rng & Units 09/11/2020  IgE (Immunoglobulin E), Serum     <OR=114 kU/L 5   Component     Latest Ref Rng & Units 09/11/2020  WBC     4.0 - 10.5 K/uL 11.0 (H)  RBC     3.87 - 5.11 Mil/uL 4.61  Hemoglobin     12.0 - 15.0 g/dL 14.0  HCT     36 - 46 % 43.0  MCV     78.0 - 100.0 fl 93.3  MCHC     30.0 - 36.0 g/dL 32.6  RDW     11.5 - 15.5 % 13.9  Platelets     150 - 400 K/uL 300.0  Neutrophils     43 - 77 % 53.1  Lymphocytes     12 - 46 % 28.8  Monocytes Relative     3 - 12 % 9.3  Eosinophil     0 - 5 % 7.7 (H)  Basophil     0 - 3 % 1.1  NEUT#     1.4 - 7.7 K/uL 5.8  Lymphocyte #     0.7 - 4.0 K/uL 3.2  Monocyte #     0.1 - 1.0 K/uL  1.0  Eosinophils Absolute     0.0 - 0.7 K/uL 0.8 (H)  Basophils Absolute     0.0 - 0.1 K/uL 0.1   10/05/2020 pulmonology visit: "Intrinsic asthma Complicated case.  She likely does have some true airflow obstruction but much of her acute symptoms relate to upper airway disease.  She is working to treat GERD and allergic rhinitis.  I have asked her to see allergy to consider skin testing.  We will continue her current regimen, continue albuterol as needed.  I will hold off on ordering a maintenance bronchodilator/ICS because she has had Worsening of her upper airway irritation on these medications before.  There may be some benefit to diagnosing/treating sleep apnea as well.  Snoring Moderate suspicion for obstructive sleep apnea.  This may actually  contribute to upper airway irritation and chronic inflammation.  We will arrange for a polysomnogram, optimally a split-night sleep study.  We will obtain a home study if this is preferred by her insurance company."  09/19/2020 hospitalization: "Hospital Course:  1. Acute asthma exacerbation/ Pneumonia likely due to gram positive organism- -Patient was admitted started on IV steroids, scheduled nebulizer treatments and supplemental oxygen. Her oxygen requirements have drastically improved and she is currently satting well on room air. She was started on IV azithromycin and ceftriaxone for possible community-acquired pneumonia and did complete 5 days of therapy. She will be discharged home on her current asthma medications with close follow-up with her primary allergist. The patient did have a respiratory bio fire that did not return positive for any acute viral illnesses. The cause of her asthma exacerbation is unknown at this time. We discussed potentially initiating inhaled corticosteroid but for now, we have elected to hold until she follows up with her primary physician.  2. Anxiety- -The patient was started on low-dose Xanax in the hospital. This is slowly been weaned. I did discuss with her that this will not be continued as an outpatient and that she would need to receive this from her primary physician if felt warranted.   Patient's chronic medical conditions to include hypertension and hyperlipidemia were managed with her home medications."  Assessment and Plan: Katrina Roman is a 44 y.o. female with: Chronic rhinitis Rhinoconjunctivitis symptoms mainly during the fall for the past 20+ years.  Used Claritin, Xyzal and saline rinse with some benefit.  Skin testing in the 1990s was positive to grass, trees and mold per patient report.  No previous allergy immunotherapy.  IgE in 2021 was 5.  2015 environmental allergy blood work was essentially negative.  Concerns if there are any allergic triggers  contributing to her asthma exacerbations every fall.  Today's skin testing showed: Negative to indoor/outdoor allergens and common foods.  No environmental allergy triggers found today.   Nasal polyp Nasal polypectomy in the 1990s and in 2000's.  Patient has bilateral polyps visible on exam today. Tried Flonase in the past.   Start Xhance nasal spray first 2 sprays per nostril twice a day. Sample given and demonstrated proper use.  Refer to ENT - Dr. Benjamine Mola.  If above regimen does not help then recommend starting Dupixent injections. Handout given.  This may be a good option for her as it can also help with her asthma symptoms.   Asthma Managed by pulmonology but had recent hospitalization in October with possibly pneumonia. Not up to date with COVID-19 vaccines. Tried various inhalers in the past which may have worsened symptoms per patient report. Currently on Singulair daily and albuterol  prn. 2021 eos 800, IgE 5.  Today's spirometry was: normal with 15% improvement in FEV1 post bronchodilator treatment. Clinically feeling the same.   ACT score 22.  Continue to follow up with pulmonology.  Continue montelukast 62m daily at night.   May use albuterol rescue inhaler 2 puffs or nebulizer every 4 to 6 hours as needed for shortness of breath, chest tightness, coughing, and wheezing. May use albuterol rescue inhaler 2 puffs 5 to 15 minutes prior to strenuous physical activities. Monitor frequency of use.   Give her history of polyps and aspirin sensitivity with asthma she may have AERD. Denies taking NSAIDS in the fall and only takes Tylenol on rare occasions.   Heartburn  Continue Protonix 430mdaily.  See below for lifestyle modification for heartburn.  Drug reaction  Continue to avoid NSAIDs, cortisone and cefdinir.  Return in about 2 months (around 12/25/2020).  Recommend COVID-19 vaccine and establishing care with a PCP.  Meds ordered this encounter  Medications  .  Fluticasone Propionate (XHANCE) 93 MCG/ACT EXHU    Sig: Place 2 sprays into the nose in the morning and at bedtime.    Dispense:  16 mL    Refill:  5    Patient has tried and failed Flonase, Rhinocort. Dx Code: J33.9   Other allergy screening: Food allergy: no Medication allergy: yes  Aspirin causes asthma flare.  Hymenoptera allergy: large localized reactions Urticaria: no  Eczema:no History of recurrent infections suggestive of immunodeficency: no  Diagnostics: Spirometry:  Tracings reviewed. Her effort: Good reproducible efforts. FVC: 2.64L FEV1: 1.83L, 72% predicted FEV1/FVC ratio: 69% Interpretation: Spirometry consistent with normal pattern with 15% improvement in FEV1 post bronchodilator treatment. Clinically feeling the same.  Please see scanned spirometry results for details.  Skin Testing: Environmental allergy panel and select foods. Negative to indoor/outdoor allergens and common foods.  Results discussed with patient/family.  Airborne Adult Perc - 10/25/20 0919    Time Antigen Placed 0919    Allergen Manufacturer GrLavella Hammock  Location Back    Number of Test 59    1. Control-Buffer 50% Glycerol Negative    2. Control-Histamine 1 mg/ml 2+    3. Albumin saline Negative    4. BaComstockegative    5. BeGuatemalaegative    6. Johnson Negative    7. KeGenoalue Negative    8. Meadow Fescue Negative    9. Perennial Rye Negative    10. Sweet Vernal Negative    11. Timothy Negative    12. Cocklebur Negative    13. Burweed Marshelder Negative    14. Ragweed, short Negative    15. Ragweed, Giant Negative    16. Plantain,  English Negative    17. Lamb's Quarters Negative    18. Sheep Sorrell Negative    19. Rough Pigweed Negative    20. Marsh Elder, Rough Negative    21. Mugwort, Common Negative    22. Ash mix Negative    23. Birch mix Negative    24. Beech American Negative    25. Box, Elder Negative    26. Cedar, red Negative    27. Cottonwood, EaRussian Federationegative      28. Elm mix Negative    29. Hickory Negative    30. Maple mix Negative    31. Oak, EaRussian Federationix Negative    32. Pecan Pollen Negative    33. Pine mix Negative    34. Sycamore Eastern Negative    35. WaWoodsonBlack Pollen  Negative    36. Alternaria alternata Negative    37. Cladosporium Herbarum Negative    38. Aspergillus mix Negative    39. Penicillium mix Negative    40. Bipolaris sorokiniana (Helminthosporium) Negative    41. Drechslera spicifera (Curvularia) Negative    42. Mucor plumbeus Negative    43. Fusarium moniliforme Negative    44. Aureobasidium pullulans (pullulara) Negative    45. Rhizopus oryzae Negative    46. Botrytis cinera Negative    47. Epicoccum nigrum Negative    48. Phoma betae Negative    49. Candida Albicans Negative    50. Trichophyton mentagrophytes Negative    51. Mite, D Farinae  5,000 AU/ml Negative    52. Mite, D Pteronyssinus  5,000 AU/ml Negative    53. Cat Hair 10,000 BAU/ml Negative    54.  Dog Epithelia Negative    55. Mixed Feathers Negative    56. Horse Epithelia Negative    57. Cockroach, German Negative    58. Mouse Negative    59. Tobacco Leaf Negative          Food Perc - 10/25/20 0920      Test Information   Time Antigen Placed 0920    Allergen Manufacturer Lavella Hammock    Location Back    Number of allergen test 10      Food   1. Peanut Negative    2. Soybean food Negative    3. Wheat, whole Negative    4. Sesame Negative    5. Milk, cow Negative    6. Egg White, chicken Negative    7. Casein Negative    8. Shellfish mix Negative    9. Fish mix Negative    10. Cashew Negative           Past Medical History: Patient Active Problem List   Diagnosis Date Noted  . Chronic rhinitis 10/25/2020  . Nasal polyp 10/25/2020  . Asthma 10/25/2020  . Heartburn 10/25/2020  . Drug reaction 10/25/2020  . Snoring 10/05/2020  . Allergic rhinitis 10/04/2015  . Intrinsic asthma 05/17/2014  . Cough 05/17/2014  . Eosinophilia  01/15/2011  . PUD 01/15/2011  . GERD 07/31/2010   Past Medical History:  Diagnosis Date  . Asthma   . GERD (gastroesophageal reflux disease)   . Peripheral eosinophilia   . PUD (peptic ulcer disease)   . Seasonal allergies    Past Surgical History: Past Surgical History:  Procedure Laterality Date  . ESOPHAGOGASTRODUODENOSCOPY  08/2010   noraml esophagus,3 1-mm superficial gastric ulcers seen in the antrum, No evidence H pylori  . FOOT SURGERY     Right; three screws,chronic frature  . NASAL SINUS SURGERY  1997 and 2009   x 2  . TONSILLECTOMY     Medication List:  Current Outpatient Medications  Medication Sig Dispense Refill  . albuterol (PROVENTIL) (2.5 MG/3ML) 0.083% nebulizer solution Take 3 mLs (2.5 mg total) by nebulization every 6 (six) hours as needed for wheezing or shortness of breath. 1080 mL 0  . budesonide-formoterol (SYMBICORT) 160-4.5 MCG/ACT inhaler Symbicort 80 mcg-4.5 mcg/actuation HFA aerosol inhaler    . ergocalciferol (VITAMIN D2) 1.25 MG (50000 UT) capsule ergocalciferol (vitamin D2) 1,250 mcg (50,000 unit) capsule    . loratadine-pseudoephedrine (CLARITIN-D 24 HOUR) 10-240 MG 24 hr tablet Take 1 tablet by mouth daily. 30 tablet 5  . montelukast (SINGULAIR) 10 MG tablet TAKE 1 TABLET BY MOUTH EVERYDAY AT BEDTIME 90 tablet 3  . pantoprazole (PROTONIX) 40 MG  tablet Take 1 tablet (40 mg total) by mouth daily. 90 tablet 3  . Spacer/Aero-Holding Chambers (AEROCHAMBER MV) inhaler Use as instructed 1 each 0  . VENTOLIN HFA 108 (90 Base) MCG/ACT inhaler Inhale 2 puffs into the lungs every 6 (six) hours as needed for wheezing or shortness of breath. 56 g 3  . budesonide-formoterol (SYMBICORT) 160-4.5 MCG/ACT inhaler Inhale 2 puffs into the lungs 2 (two) times daily. (Patient not taking: Reported on 10/05/2020) 1 Inhaler 6  . Fluticasone Propionate (XHANCE) 93 MCG/ACT EXHU Place 2 sprays into the nose in the morning and at bedtime. 16 mL 5  . loratadine (CLARITIN) 10 MG  tablet Take 10 mg by mouth daily.      Marland Kitchen omeprazole (PRILOSEC) 20 MG capsule Take 1 capsule (20 mg total) by mouth daily. (Patient not taking: Reported on 10/05/2020) 180 capsule 3   No current facility-administered medications for this visit.   Allergies: Allergies  Allergen Reactions  . Aspirin     REACTION: affects asthma  . Cefdinir     Severe stomach cramps per patient, no nausea/vomiting  . Cortisone     Swelling, flushing,itching   Social History: Social History   Socioeconomic History  . Marital status: Legally Separated    Spouse name: Not on file  . Number of children: Not on file  . Years of education: Not on file  . Highest education level: Not on file  Occupational History  . Occupation: Research scientist (physical sciences): [employer]  Tobacco Use  . Smoking status: Never Smoker  . Smokeless tobacco: Never Used  Vaping Use  . Vaping Use: Never used  Substance and Sexual Activity  . Alcohol use: No  . Drug use: No  . Sexual activity: Yes  Other Topics Concern  . Not on file  Social History Narrative  . Not on file   Social Determinants of Health   Financial Resource Strain:   . Difficulty of Paying Living Expenses: Not on file  Food Insecurity:   . Worried About Charity fundraiser in the Last Year: Not on file  . Ran Out of Food in the Last Year: Not on file  Transportation Needs:   . Lack of Transportation (Medical): Not on file  . Lack of Transportation (Non-Medical): Not on file  Physical Activity:   . Days of Exercise per Week: Not on file  . Minutes of Exercise per Session: Not on file  Stress:   . Feeling of Stress : Not on file  Social Connections:   . Frequency of Communication with Friends and Family: Not on file  . Frequency of Social Gatherings with Friends and Family: Not on file  . Attends Religious Services: Not on file  . Active Member of Clubs or Organizations: Not on file  . Attends Archivist Meetings: Not on file    . Marital Status: Not on file   Lives in a 44 year old home. Smoking: denies Occupation: Energy manager HistoryFreight forwarder in the house: no Charity fundraiser in the family room: yes Carpet in the bedroom: yes Heating: electric Cooling: central Pet: no  Family History: Family History  Problem Relation Age of Onset  . Skin cancer Maternal Grandfather   . Hypertension Father   . Diabetes Father   . Hypertension Mother    Problem  Relation Asthma                                   No  Eczema                                No  Food allergy                          No  Allergic rhino conjunctivitis     No   Review of Systems  Constitutional: Negative for appetite change, chills, fever and unexpected weight change.  HENT: Positive for sneezing. Negative for congestion and rhinorrhea.   Eyes: Positive for itching.  Respiratory: Negative for cough, chest tightness, shortness of breath and wheezing.   Cardiovascular: Negative for chest pain.  Gastrointestinal: Negative for abdominal pain.  Genitourinary: Negative for difficulty urinating.  Skin: Negative for rash.  Allergic/Immunologic: Negative for environmental allergies and food allergies.  Neurological: Negative for headaches.   Objective: BP 128/90   Pulse 86   Temp 98.1 F (36.7 C)   Resp 14   Ht 5' 0.5" (1.537 m)   Wt 229 lb 6.4 oz (104.1 kg)   SpO2 99%   BMI 44.06 kg/m  Body mass index is 44.06 kg/m. Physical Exam Vitals and nursing note reviewed.  Constitutional:      Appearance: Normal appearance. She is well-developed.  HENT:     Head: Normocephalic and atraumatic.     Right Ear: Tympanic membrane and external ear normal.     Left Ear: Tympanic membrane and external ear normal.     Nose:     Comments: B/l polyps    Mouth/Throat:     Mouth: Mucous membranes are moist.     Pharynx: Oropharynx is clear.  Eyes:     Conjunctiva/sclera: Conjunctivae normal.   Cardiovascular:     Rate and Rhythm: Normal rate and regular rhythm.     Heart sounds: Normal heart sounds. No murmur heard.  No friction rub. No gallop.   Pulmonary:     Effort: Pulmonary effort is normal.     Breath sounds: Normal breath sounds. No wheezing, rhonchi or rales.  Musculoskeletal:     Cervical back: Neck supple.  Skin:    General: Skin is warm.     Findings: No rash.  Neurological:     Mental Status: She is alert and oriented to person, place, and time.  Psychiatric:        Behavior: Behavior normal.    The plan was reviewed with the patient/family, and all questions/concerned were addressed.  It was my pleasure to see Katrina Roman today and participate in her care. Please feel free to contact me with any questions or concerns.  Sincerely,  Rexene Alberts, DO Allergy & Immunology  [facility] office: [place] office: [phone]

## 2020-10-25 ENCOUNTER — Other Ambulatory Visit: Payer: Self-pay

## 2020-10-25 ENCOUNTER — Ambulatory Visit: Payer: 59 | Admitting: Allergy

## 2020-10-25 ENCOUNTER — Encounter: Payer: Self-pay | Admitting: Allergy

## 2020-10-25 VITALS — BP 128/90 | HR 86 | Temp 98.1°F | Resp 14 | Ht 60.5 in | Wt 229.4 lb

## 2020-10-25 DIAGNOSIS — J45909 Unspecified asthma, uncomplicated: Secondary | ICD-10-CM | POA: Diagnosis not present

## 2020-10-25 DIAGNOSIS — R12 Heartburn: Secondary | ICD-10-CM | POA: Diagnosis not present

## 2020-10-25 DIAGNOSIS — T50905A Adverse effect of unspecified drugs, medicaments and biological substances, initial encounter: Secondary | ICD-10-CM | POA: Insufficient documentation

## 2020-10-25 DIAGNOSIS — J310 Chronic rhinitis: Secondary | ICD-10-CM | POA: Diagnosis not present

## 2020-10-25 DIAGNOSIS — T50905D Adverse effect of unspecified drugs, medicaments and biological substances, subsequent encounter: Secondary | ICD-10-CM

## 2020-10-25 DIAGNOSIS — J339 Nasal polyp, unspecified: Secondary | ICD-10-CM | POA: Insufficient documentation

## 2020-10-25 MED ORDER — XHANCE 93 MCG/ACT NA EXHU: 2.0000 | INHALANT_SUSPENSION | Freq: Two times a day (BID) | NASAL | 5 refills | Status: DC

## 2020-10-25 NOTE — Assessment & Plan Note (Signed)
Nasal polypectomy in the 1990s and in 2000's.  Patient has bilateral polyps visible on exam today. Tried Flonase in the past.   Start Xhance nasal spray first 2 sprays per nostril twice a day. Sample given and demonstrated proper use.  Refer to ENT - Dr. Benjamine Mola.  If above regimen does not help then recommend starting Dupixent injections. Handout given.  This may be a good option for her as it can also help with her asthma symptoms.

## 2020-10-25 NOTE — Assessment & Plan Note (Signed)
   Continue Protonix 37m daily.  See below for lifestyle modification for heartburn.

## 2020-10-25 NOTE — Assessment & Plan Note (Signed)
Rhinoconjunctivitis symptoms mainly during the fall for the past 20+ years.  Used Claritin, Xyzal and saline rinse with some benefit.  Skin testing in the 1990s was positive to grass, trees and mold per patient report.  No previous allergy immunotherapy.  IgE in 2021 was 5.  2015 environmental allergy blood work was essentially negative.  Concerns if there are any allergic triggers contributing to her asthma exacerbations every fall.  Today's skin testing showed: Negative to indoor/outdoor allergens and common foods.  No environmental allergy triggers found today.

## 2020-10-25 NOTE — Patient Instructions (Addendum)
Today's skin testing showed: Negative to indoor/outdoor allergens and common foods.   Asthma:  Continue to follow up with pulmonology.  May use albuterol rescue inhaler 2 puffs or nebulizer every 4 to 6 hours as needed for shortness of breath, chest tightness, coughing, and wheezing. May use albuterol rescue inhaler 2 puffs 5 to 15 minutes prior to strenuous physical activities. Monitor frequency of use.   Drug allergy:  Continue to avoid all NSAIDS - such as aspirin, ibuprofen.  Nasal polyps:  Polyps visible on both sides.   Recommend starting Dupixent injections given your history of polyp surgery twice - read handout.  This may also help with your asthma.    Will do a trial of Xhance nasal spray first 2 sprays per nostril twice a day. Sample given and demonstrated proper use.  Give it a 2 month trial.  Will refer to ENT - Dr. Benjamine Mola.  You may also have something called aspirin exacerbated respiratory disease - patients usually have asthma, polyps and aspirin allergy.  Reflux:  Continue Protonix 31m daily.  See below for lifestyle modification for heartburn.  Follow up in 2 months or sooner if needed.   Recommend that you establish care with a PCP.  Recommend getting the COVID-19 vaccine.  Here's more information: [email or web address].info/ Heartburn Heartburn is a type of pain or discomfort that can happen in the throat or chest. It is often described as a burning pain. It may also cause a bad, acid-like taste in the mouth. Heartburn may feel worse when you lie down or bend over. It may be worse at night. It may be caused by stomach contents that move back up (reflux) into the tube that connects the mouth with the stomach (esophagus). Follow these instructions at home: Eating and drinking   Avoid certain foods and drinks as told by your doctor. This may include: ? Coffee and tea (with or without caffeine). ? Drinks that have alcohol. ? Energy drinks and  sports drinks. ? Carbonated drinks or sodas. ? Chocolate and cocoa. ? Peppermint and mint flavorings. ? Garlic and onions. ? Horseradish. ? Spicy and acidic foods, such as:  Peppers.  Chili powder and curry powder.  Vinegar.  Hot sauces and BBQ sauce. ? Citrus fruit juices and citrus fruits, such as:  Oranges.  Lemons.  Limes. ? Tomato-based foods, such as:  Red sauce and pizza with red sauce.  Chili.  Salsa. ? Fried and fatty foods, such as:  Donuts.  [place] and potato chips.  High-fat dressings. ? High-fat meats, such as:  Hot dogs and sausage.  Rib eye steak.  Ham and bacon. ? High-fat dairy items, such as:  Whole milk.  Butter.  Cream cheese.  Eat small meals often. Avoid eating large meals.  Avoid drinking large amounts of liquid with your meals.  Avoid eating meals during the 2-3 hours before bedtime.  Avoid lying down right after you eat.  Do not exercise right after you eat. Lifestyle      If you are overweight, lose an amount of weight that is healthy for you. Ask your doctor about a safe weight loss goal.  Do not use any products that contain nicotine or tobacco, including cigarettes, e-cigarettes, and chewing tobacco. These can make your symptoms worse. If you need help quitting, ask your doctor.  Wear loose clothes. Do not wear anything tight around your waist.  Raise (elevate) the head of your bed about 6 inches (15 cm) when you sleep.  Try to lower your stress. If you need help doing this, ask your doctor. General instructions  Pay attention to any changes in your symptoms.  Take over-the-counter and prescription medicines only as told by your doctor. ? Do not take aspirin, ibuprofen, or other NSAIDs unless your doctor says it is okay. ? Stop medicines only as told by your doctor.  Keep all follow-up visits as told by your doctor. This is important. Contact a doctor if:  You have new symptoms.  You lose weight  and you do not know why it is happening.  You have trouble swallowing, or it hurts to swallow.  You have wheezing or a cough that keeps happening.  Your symptoms do not get better with treatment.  You have heartburn often for more than 2 weeks. Get help right away if:  You have pain in your arms, neck, jaw, teeth, or back.  You feel sweaty, dizzy, or light-headed.  You have chest pain or shortness of breath.  You throw up (vomit) and your throw up looks like blood or coffee grounds.  Your poop (stool) is bloody or black. These symptoms may represent a serious problem that is an emergency. Do not wait to see if the symptoms will go away. Get medical help right away. Call your local emergency services (911 in the [place] Do not drive yourself to the hospital. Summary  Heartburn is a type of pain that can happen in the throat or chest. It can feel like a burning pain. It may also cause a bad, acid-like taste in the mouth.  You may need to avoid certain foods and drinks to help your symptoms. Ask your doctor what foods and drinks you should avoid.  Take over-the-counter and prescription medicines only as told by your doctor. Do not take aspirin, ibuprofen, or other NSAIDs unless your doctor told you to do so.  Contact your doctor if your symptoms do not get better or they get worse. This information is not intended to replace advice given to you by your health care provider. Make sure you discuss any questions you have with your health care provider. Document Revised: 05/04/2018 Document Reviewed: 05/04/2018 Elsevier Patient Education  [place].

## 2020-10-25 NOTE — Assessment & Plan Note (Addendum)
Managed by pulmonology but had recent hospitalization in October with possibly pneumonia. Not up to date with COVID-19 vaccines. Tried various inhalers in the past which may have worsened symptoms per patient report. Currently on Singulair daily and albuterol prn. 2021 eos 800, IgE 5.  Today's spirometry was: normal with 15% improvement in FEV1 post bronchodilator treatment. Clinically feeling the same.   ACT score 22.  Continue to follow up with pulmonology.  Continue montelukast 94m daily at night.   May use albuterol rescue inhaler 2 puffs or nebulizer every 4 to 6 hours as needed for shortness of breath, chest tightness, coughing, and wheezing. May use albuterol rescue inhaler 2 puffs 5 to 15 minutes prior to strenuous physical activities. Monitor frequency of use.   Give her history of polyps and aspirin sensitivity with asthma she may have AERD. Denies taking NSAIDS in the fall and only takes Tylenol on rare occasions.

## 2020-10-25 NOTE — Assessment & Plan Note (Signed)
   Continue to avoid NSAIDs, cortisone and cefdinir.

## 2020-10-27 ENCOUNTER — Telehealth: Payer: Self-pay

## 2020-10-27 NOTE — Telephone Encounter (Signed)
-----   Message from Derl Barrow, [place] sent at 10/25/2020 10:17 AM EST ----- Regarding: Referral An Ambulatory Referral has been placed for Dr. Benjamine Mola for nasal polyps.

## 2020-11-17 ENCOUNTER — Ambulatory Visit: Payer: 59 | Attending: Emergency Medicine | Admitting: Pulmonary Disease

## 2020-11-17 ENCOUNTER — Other Ambulatory Visit: Payer: Self-pay

## 2020-11-17 DIAGNOSIS — R0683 Snoring: Secondary | ICD-10-CM | POA: Insufficient documentation

## 2020-11-17 DIAGNOSIS — G4733 Obstructive sleep apnea (adult) (pediatric): Secondary | ICD-10-CM | POA: Insufficient documentation

## 2020-11-20 NOTE — Procedures (Signed)
    Patient Name: Katrina Roman, Gillean Date: 11/17/2020 Gender: Female D.O.B: [birth year] Age (years): 44 Referring Provider: Baltazar Apo Height (inches): 61 Interpreting Physician: Chesley Mires MD, ABSM Weight (lbs): 250 RPSGT: Peak, Robert BMI: 47 MRN: [id number] Neck Size: 16.00  CLINICAL INFORMATION Sleep Study Type: NPSG  Indication for sleep study: snoring, sleep disruption, daytime fatigue.    Epworth Sleepiness Score: 6  SLEEP STUDY TECHNIQUE As per the AASM Manual for the Scoring of Sleep and Associated Events v2.3 (April 2016) with a hypopnea requiring 4% desaturations.  The channels recorded and monitored were frontal, central and occipital EEG, electrooculogram (EOG), submentalis EMG (chin), nasal and oral airflow, thoracic and abdominal wall motion, anterior tibialis EMG, snore microphone, electrocardiogram, and pulse oximetry.  MEDICATIONS Medications self-administered by patient taken the night of the study : N/A  SLEEP ARCHITECTURE The study was initiated at 9:24:51 PM and ended at 5:03:28 AM.  Sleep onset time was 51.4 minutes and the sleep efficiency was 83.0%. The total sleep time was 380.7 minutes.  Stage REM latency was 181.0 minutes.  The patient spent 1.97% of the night in stage N1 sleep, 81.22% in stage N2 sleep, 0.00% in stage N3 and 16.8% in REM.  Alpha intrusion was absent.  Supine sleep was 0.00%.  RESPIRATORY PARAMETERS The overall apnea/hypopnea index (AHI) was 6.5 per hour. There were 0 total apneas, including 0 obstructive, 0 central and 0 mixed apneas. There were 41 hypopneas and 0 RERAs.  The AHI during Stage REM sleep was 19.7 per hour.  AHI while supine was N/A per hour.  The mean oxygen saturation was 91.03%. The minimum SpO2 during sleep was 85.00%.  loud snoring was noted during this study.  CARDIAC DATA The 2 lead EKG demonstrated sinus rhythm. The mean heart rate was 76.99 beats per minute. Other EKG findings include:  None.  LEG MOVEMENT DATA The total PLMS were 0 with a resulting PLMS index of 0.00. Associated arousal with leg movement index was 0.0 .  IMPRESSIONS - Mild obstructive sleep apnea with an overall AHI of 6.5 and SpO2 low of 85%.  She had a significant REM effect with REM AHI of 19.7. - Supplemental oxygen was not used during this study.  DIAGNOSIS - Obstructive Sleep Apnea (G47.33)  RECOMMENDATIONS - Additional treatment options include weight loss, CPAP, oral appliance, or surgical assessment. - Avoid alcohol, sedatives and other CNS depressants that may worsen sleep apnea and disrupt normal sleep architecture. - Sleep hygiene should be reviewed to assess factors that may improve sleep quality.  [Electronically signed] 11/20/2020 01:06 PM  Chesley Mires MD, [place], American Board of Sleep Medicine   NPI: [id number]

## 2020-11-24 ENCOUNTER — Telehealth: Payer: 59 | Admitting: Physician Assistant

## 2020-11-24 DIAGNOSIS — J329 Chronic sinusitis, unspecified: Secondary | ICD-10-CM | POA: Diagnosis not present

## 2020-11-24 MED ORDER — PREDNISONE 20 MG PO TABS: 40.0000 mg | ORAL_TABLET | Freq: Every day | ORAL | 0 refills | Status: AC

## 2020-11-24 MED ORDER — DOXYCYCLINE HYCLATE 100 MG PO TABS: 100.0000 mg | ORAL_TABLET | Freq: Two times a day (BID) | ORAL | 0 refills | Status: DC

## 2020-11-24 NOTE — Progress Notes (Signed)
Hi Jamiesha,   I am sorry you are not feeling well.  I am happy to treat you today, but I recommend that you schedule acute visit appointments with your Primary Care Provider or Pulmonologist in the future.    You have been seen at least nine times this year for asthma, sinus, or URI with one hospital admission and one ED visit.    For someone like you with a complicated case of upper and lower airway problems, it is important to have good continuity of care.  With E-visits, you are seeing a different person every time who does not know your past medical history very well.  I just want to make sure you are receiving the best possible care for your needs.       Here is how we plan to help!  Based on what you have shared with me it looks like you have sinusitis.  Sinusitis is inflammation and infection in the sinus cavities of the head.  Based on your presentation I believe you most likely have Acute Bacterial Sinusitis.  This is an infection caused by bacteria and is treated with antibiotics. I have prescribed Doxycycline 129m by mouth twice a day for 10 days. As well as Prednisone 410mx 5 days.   You may use an oral decongestant such as Mucinex D or if you have glaucoma or high blood pressure use plain Mucinex. Saline nasal spray help and can safely be used as often as needed for congestion.  If you develop worsening sinus pain, fever or notice severe headache and vision changes, or if symptoms are not better after completion of antibiotic, please schedule an appointment with a health care provider.    Sinus infections are not as easily transmitted as other respiratory infection, however we still recommend that you avoid close contact with loved ones, especially the very young and elderly.  Remember to wash your hands thoroughly throughout the day as this is the number one way to prevent the spread of infection!  Home Care:  Only take medications as instructed by your medical team.  Complete the  entire course of an antibiotic.  Do not take these medications with alcohol.  A steam or ultrasonic humidifier can help congestion.  You can place a towel over your head and breathe in the steam from hot water coming from a faucet.  Avoid close contacts especially the very young and the elderly.  Cover your mouth when you cough or sneeze.  Always remember to wash your hands.  Get Help Right Away If:  You develop worsening fever or sinus pain.  You develop a severe head ache or visual changes.  Your symptoms persist after you have completed your treatment plan.  Make sure you  Understand these instructions.  Will watch your condition.  Will get help right away if you are not doing well or get worse.  Your e-visit answers were reviewed by a board certified advanced clinical practitioner to complete your personal care plan.  Depending on the condition, your plan could have included both over the counter or prescription medications.  If there is a problem please reply  once you have received a response from your provider.  Your safety is important to [place] If you have drug allergies check your prescription carefully.    You can use MyChart to ask questions about today's visit, request a non-urgent call back, or ask for a work or school excuse for 24 hours related to this e-Visit.  If it has been greater than 24 hours you will need to follow up with your provider, or enter a new e-Visit to address those concerns.  You will get an e-mail in the next two days asking about your experience.  I hope that your e-visit has been valuable and will speed your recovery. Thank you for using e-visits.   Greater than 5 minutes, yet less than 10 minutes of time have been spent researching, coordinating and implementing care for this patient today.

## 2020-11-29 ENCOUNTER — Other Ambulatory Visit: Payer: Self-pay

## 2020-11-29 ENCOUNTER — Ambulatory Visit: Payer: 59 | Admitting: Emergency Medicine

## 2020-11-29 ENCOUNTER — Encounter: Payer: Self-pay | Admitting: Emergency Medicine

## 2020-11-29 VITALS — BP 124/82 | HR 88 | Temp 98.0°F | Ht 61.0 in | Wt 228.0 lb

## 2020-11-29 DIAGNOSIS — G4733 Obstructive sleep apnea (adult) (pediatric): Secondary | ICD-10-CM | POA: Diagnosis not present

## 2020-11-29 DIAGNOSIS — J310 Chronic rhinitis: Secondary | ICD-10-CM | POA: Diagnosis not present

## 2020-11-29 DIAGNOSIS — J45909 Unspecified asthma, uncomplicated: Secondary | ICD-10-CM | POA: Diagnosis not present

## 2020-11-29 DIAGNOSIS — R0683 Snoring: Secondary | ICD-10-CM | POA: Diagnosis not present

## 2020-11-29 DIAGNOSIS — J339 Nasal polyp, unspecified: Secondary | ICD-10-CM

## 2020-11-29 NOTE — Assessment & Plan Note (Signed)
Mild OSA with an AHI 6.5/h but higher in rem and certainly clinically significant with loud snoring, daytime sleepiness, high Epworth score.  I believe she would benefit from treatment.  She would like to try the dental appliance before returning to CPAP.  I will make a referral for her.

## 2020-11-29 NOTE — Assessment & Plan Note (Signed)
Will refer her back to ENT for evaluation.  She is had resections before.  Question whether she may be a candidate for adjunct therapy, Dupixent, going forward?

## 2020-11-29 NOTE — Assessment & Plan Note (Signed)
Mild remittent asthma, better controlled as her allergies have been less active.  Continue albuterol as needed.  Likely repeat her IgE, eosinophil count at some point going forward.  Continue Singulair, fluticasone nasal spray, loratadine

## 2020-11-29 NOTE — Assessment & Plan Note (Addendum)
Continue her fluticasone, Singulair, loratadine She underwent skin testing and did not have any specific targets that would benefit from immunotherapy.

## 2020-11-29 NOTE — Progress Notes (Signed)
Subjective:    Patient ID: Katrina Roman, female    DOB: 09-May-1976, 44 y.o.   MRN: [id number]  Asthma There is no cough, shortness of breath or wheezing. Pertinent negatives include no ear pain, fever, headaches, postnasal drip, rhinorrhea, sneezing, sore throat or trouble swallowing. Her past medical history is significant for asthma.    ROV 09/11/20 --44 year old woman, never smoker whom I have followed for allergic rhinitis, mild admission asthma, chronic cough.  I saw her last month and she was having more symptoms, throat irritation, cough.  We continued Singulair, were going to add back Flonase nasal spray but did not do.  Instead added nasal saline rinses and started pantoprazole to see if she would get benefit. She temporarily stopped Symbicort last time.  Today she reports that her GERD is better, but her UA is still raw, lots of throat clearing, barky dry cough.   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  She has impact from GERD and chronic rhinitis.  At her last visit we talked about referral for allergy skin testing.  IgE 09/11/2020 was low, eosinophil count slightly elevated at 800/mcL.  She was seen in the emergency department 09/19/2020 with apparent bronchospasm, describes acute onset increased cough and wheezing, unresponsive to her BD's. She required BiPAP, treated with abx and steroids. Finished prednisone. Has albuterol prn.   ROV 11/29/20 --Katrina Roman follows up today for her history of mild intermittent asthma and upper airway irritability syndrome, chronic cough.  She is 30, has GERD, chronic rhinitis.  Reassuring IgE in 08/2020.  Both her asthma and her upper airway disease are impacted by allergies and we talked about having her evaluated by an allergist >> her skin testing was negatively and immunotherapy wasn't recommended. We also arrange for split-night sleep  study that was done 11/17/2020 I have reviewed, the AHI was 6.5/h, 19.7/h in rem sleep.  It does not look like she met criteria for placement of CPAP for titration purposes.  Current regimen includes Singulair, fluticasone nasal spray, loratadine.  He is on Protonix.  Her throat clearing is better. She using albuterol approximately  She is interested in treating OSA - is thinking about CPAP or dental device. She has had nasal polyps removed before, is considering referral back to ENT at some point.   MDM: Reviewed sleep study Reviewed office visit from Dr. Maudie Mercury with allergy on 10/25/2020   Review of Systems  Constitutional: Negative for fever and unexpected weight change.  HENT: Negative for congestion, dental problem, ear pain, nosebleeds, postnasal drip, rhinorrhea, sinus pressure, sneezing, sore throat and trouble swallowing.   Eyes: Negative for redness and itching.  Respiratory: Negative for cough, chest tightness, shortness of breath and wheezing.   Cardiovascular: Negative for palpitations and leg swelling.  Gastrointestinal: Negative for nausea and vomiting.  Genitourinary: Negative for dysuria.  Musculoskeletal: Negative for joint swelling.  Skin: Negative for rash.  Neurological: Negative for headaches.  Hematological: Does not bruise/bleed easily.  Psychiatric/Behavioral: Negative for dysphoric mood. The patient is not nervous/anxious.        Objective:   Physical Exam Vitals:   11/29/20 1046  BP: 124/82  Pulse: 88  Temp: 98 F (36.7 C)  SpO2: 98%  Weight: 228 lb (103.4 kg)  Height: 5' 1"  (1.549 m)   Gen: Pleasant, overwt woman, in no distress,  normal affect  ENT: No lesions,  mouth clear,  oropharynx  clear, no postnasal drip  Neck: No JVD, no stridor today  Lungs: No use of accessory muscles, no crackles or wheezing on normal respiration, no wheeze on forced expiration,  Cardiovascular: RRR, heart sounds normal, no murmur or gallops, no peripheral  edema  Musculoskeletal: No deformities, no cyanosis or clubbing  Neuro: alert, awake, non focal  Skin: Warm, no lesions or rash       Assessment & Plan:  OSA (obstructive sleep apnea) Mild OSA with an AHI 6.5/h but higher in rem and certainly clinically significant with loud snoring, daytime sleepiness, high Epworth score.  I believe she would benefit from treatment.  She would like to try the dental appliance before returning to CPAP.  I will make a referral for her.  Intrinsic asthma Mild remittent asthma, better controlled as her allergies have been less active.  Continue albuterol as needed.  Likely repeat her IgE, eosinophil count at some point going forward.  Continue Singulair, fluticasone nasal spray, loratadine   Chronic rhinitis Continue her fluticasone, Singulair, loratadine She underwent skin testing and did not have any specific targets that would benefit from immunotherapy.  Nasal polyp Will refer her back to ENT for evaluation.  She is had resections before.  Question whether she may be a candidate for adjunct therapy, Dupixent, going forward?   Baltazar Apo, MD, PhD 11/29/2020, 4:45 PM [facility] [phone] or if no answer [phone]

## 2020-11-29 NOTE — Patient Instructions (Signed)
We will refer you to see Dr. Ron Parker with dentistry to discuss an oral appliance to treat your mild obstructive sleep apnea. Please continue your Singulair, fluticasone nasal spray, loratadine as you have been taking them. We will probably repeat your eosinophil count and IgE in the future to ensure that these are not elevated. We will refer you back to ENT at some point if you have more difficulty with her nasal polyps Keep your albuterol available use 2 puffs when you need if shortness of breath, chest tightness, wheezing. Follow with Dr Lamonte Sakai in 4 months or sooner if you have any problems.

## 2020-12-13 ENCOUNTER — Ambulatory Visit: Payer: 59 | Admitting: Emergency Medicine

## 2020-12-13 MED ORDER — PREDNISONE 10 MG PO TABS: ORAL_TABLET | ORAL | 0 refills | Status: AC

## 2020-12-13 NOTE — Telephone Encounter (Signed)
Rx has been sent to preferred pharmacy for pt.

## 2020-12-13 NOTE — Telephone Encounter (Signed)
Dr. Lamonte Sakai, please see mychart message sent by pt and advise:  To: [facility]    From: PROVIDENCIA HOTTENSTEIN    Created: 12/12/2020 7:19 PM     *-*-*This message was handled on 12/13/2020 9:18 AM by Dakhari Zuver P*-*-*  I saw Dr Lamonte Sakai last week as a follow up.   Since then I have developed a lot of chest congestion.   The congestion is causing wheezing and a non productive cough.   My inhaler and nebulizer are not helping.   Neither are over the counter pills.  Is there anything he can suggest or call in?  I don't want to end up in the hospital again.

## 2020-12-13 NOTE — Telephone Encounter (Signed)
We can try a prednisone taper to see if this helps her. Please have her call us to let us know how she responds. If she continues to have sx then I would want her to be evaluated.   Pred > Take 29m daily for 3 days, then 362mdaily for 3 days, then 2045maily for 3 days, then 36m85mily for 3 days, then stop

## 2020-12-25 ENCOUNTER — Ambulatory Visit: Payer: 59 | Admitting: Allergy

## 2021-01-02 ENCOUNTER — Encounter: Payer: Self-pay | Admitting: Emergency Medicine

## 2021-01-02 ENCOUNTER — Other Ambulatory Visit: Payer: Self-pay

## 2021-01-02 ENCOUNTER — Ambulatory Visit: Payer: 59 | Admitting: Emergency Medicine

## 2021-01-02 DIAGNOSIS — G4733 Obstructive sleep apnea (adult) (pediatric): Secondary | ICD-10-CM | POA: Diagnosis not present

## 2021-01-02 DIAGNOSIS — G478 Other sleep disorders: Secondary | ICD-10-CM | POA: Diagnosis not present

## 2021-01-02 MED ORDER — PREDNISONE 10 MG PO TABS: ORAL_TABLET | ORAL | 0 refills | Status: AC

## 2021-01-02 NOTE — Patient Instructions (Signed)
Please take prednisone as directed until completely gone. Continue Singulair 10 mg each evening Continue loratadine 10 mg once daily. Continue saline nasal spray rinses as you have been using them. Continue Protonix as you have been taking it. We will recheck your IgE, eosinophil count at your next office visit Work on setting up an office visit to obtain dental device for your obstructive sleep apnea. We will hold off on referral to ENT for now Follow with Dr Lamonte Sakai in 3 months or sooner if you have any problems.

## 2021-01-02 NOTE — Assessment & Plan Note (Signed)
She has overt stridor as her main complaint.  Associated with intermittent cough.  She has increased mucus production and sinus drainage whenever she is not on prednisone despite being on a good allergy regimen.  No targets for immunotherapy have been identified.  most recent IgE, eosinophil count were normal.  We will plan to repeat in the future when she has been off of prednisone for an extended period of time.  Denies any breakthrough GERD on Protonix.  Difficult situation as she has recurrence of symptoms whenever she is off prednisone.  I would like to try to avoid maintenance prednisone if possible given the side effects at her young age.  Because her worst time of the year is in the fall and she is for the most part stable in the spring she is hoping to treat with another prednisone taper and have improved symptoms all the way until next fall.  While this may be true more concerned that she will have temporary relief while the prednisone is ordered and then have recurrence to this baseline when it is completed.  Please take prednisone as directed until completely gone. Continue Singulair 10 mg each evening Continue loratadine 10 mg once daily. Continue saline nasal spray rinses as you have been using them. Continue Protonix as you have been taking it. We will recheck your IgE, eosinophil count at your next office visit We will hold off on referral to ENT for now Follow with Dr Lamonte Sakai in 3 months or sooner if you have any problems

## 2021-01-02 NOTE — Progress Notes (Signed)
Subjective:    Patient ID: Katrina Roman, female    DOB: Jun 01, 1976, 45 y.o.   MRN: [id number]  Asthma She complains of cough, shortness of breath and wheezing. Pertinent negatives include no ear pain, fever, headaches, postnasal drip, rhinorrhea, sneezing, sore throat or trouble swallowing. Her past medical history is significant for asthma.    ROV 11/29/20 --Katrina Roman follows up today for her history of mild intermittent asthma and upper airway irritability syndrome, chronic cough.  She is 45, has GERD, chronic rhinitis.  Reassuring IgE in 08/2020.  Both her asthma and her upper airway disease are impacted by allergies and we talked about having her evaluated by an allergist >> her skin testing was negatively and immunotherapy wasn't recommended. We also arrange for split-night sleep study that was done 11/17/2020 I have reviewed, the AHI was 6.5/h, 19.7/h in rem sleep.  It does not look like she met criteria for placement of CPAP for titration purposes.  Current regimen includes Singulair, fluticasone nasal spray, loratadine.  He is on Protonix.  Her throat clearing is better. She using albuterol approximately  She is interested in treating OSA - is thinking about CPAP or dental device. She has had nasal polyps removed before, is considering referral back to ENT at some point.    ROV 01/02/21 --45 year old woman with chronic cough in the setting of mild intermittent asthma but significant upper airway instability syndrome, both exacerbated by chronic allergic rhinitis (negative skin testing), nasal polyposis.  She also has mild obstructive sleep apnea, GERD. She is currently managed on saline nasal spray, Singulair, loratadine.  Not on flonase right now. She has albuterol which she uses multiple times a day with only marginal relief.  She was treated with a prednisone taper beginning 12/12/2020 for increased congestion, cough, wheeze.  She was better while she was on the prednisone, now reports that she is  beginning to experience persistent mucous, dry cough, UA noise / wheeze that she hears the most at night when she is laying down.  She hasn't looked into dental device for OSA yet - is going to do this.      Review of Systems  Constitutional: Negative for fever and unexpected weight change.  HENT: Negative for congestion, dental problem, ear pain, nosebleeds, postnasal drip, rhinorrhea, sinus pressure, sneezing, sore throat and trouble swallowing.   Eyes: Negative for redness and itching.  Respiratory: Positive for cough, shortness of breath and wheezing. Negative for chest tightness.   Cardiovascular: Negative for palpitations and leg swelling.  Gastrointestinal: Negative for nausea and vomiting.  Genitourinary: Negative for dysuria.  Musculoskeletal: Negative for joint swelling.  Skin: Negative for rash.  Neurological: Negative for headaches.  Hematological: Does not bruise/bleed easily.  Psychiatric/Behavioral: Negative for dysphoric mood. The patient is not nervous/anxious.        Objective:   Physical Exam Vitals:   01/02/21 1613  BP: 124/78  Pulse: (!) 105  Temp: 98.1 F (36.7 C)  SpO2: 97%  Weight: 227 lb 9.6 oz (103.2 kg)  Height: 5' 1" (1.549 m)   Gen: Pleasant, overwt woman, in no distress,  normal affect  ENT: No lesions,  mouth clear,  oropharynx clear, no postnasal drip  Neck: No JVD, insp and exp stridor  Lungs: No use of accessory muscles, loud referred UA noise on insp and exp  Cardiovascular: RRR, heart sounds normal, no murmur or gallops, no peripheral edema  Musculoskeletal: No deformities, no cyanosis or clubbing  Neuro: alert, awake, non focal  Skin: Warm, no lesions or rash       Assessment & Plan:  Upper airway resistance syndrome She has overt stridor as her main complaint.  Associated with intermittent cough.  She has increased mucus production and sinus drainage whenever she is not on prednisone despite being on a good allergy regimen.   No targets for immunotherapy have been identified.  most recent IgE, eosinophil count were normal.  We will plan to repeat in the future when she has been off of prednisone for an extended period of time.  Denies any breakthrough GERD on Protonix.  Difficult situation as she has recurrence of symptoms whenever she is off prednisone.  I would like to try to avoid maintenance prednisone if possible given the side effects at her young age.  Because her worst time of the year is in the fall and she is for the most part stable in the spring she is hoping to treat with another prednisone taper and have improved symptoms all the way until next fall.  While this may be true more concerned that she will have temporary relief while the prednisone is ordered and then have recurrence to this baseline when it is completed.  Please take prednisone as directed until completely gone. Continue Singulair 10 mg each evening Continue loratadine 10 mg once daily. Continue saline nasal spray rinses as you have been using them. Continue Protonix as you have been taking it. We will recheck your IgE, eosinophil count at your next office visit We will hold off on referral to ENT for now Follow with Dr Lamonte Sakai in 3 months or sooner if you have any problems  OSA (obstructive sleep apnea) Not yet on therapy.  She wants to try and investigate the dental device.  I supported her in this.  She has snoring and stridor at night as proven by audio recording that she provided for me today.  Work on setting up an office visit to obtain dental device for your obstructive sleep apnea.   Baltazar Apo, MD, PhD 01/02/2021, 5:04 PM [facility] [phone] or if no answer [phone]

## 2021-01-02 NOTE — Assessment & Plan Note (Signed)
Not yet on therapy.  She wants to try and investigate the dental device.  I supported her in this.  She has snoring and stridor at night as proven by audio recording that she provided for me today.  Work on setting up an office visit to obtain dental device for your obstructive sleep apnea.

## 2021-01-25 ENCOUNTER — Telehealth: Payer: 59 | Admitting: Emergency Medicine

## 2021-01-25 DIAGNOSIS — J329 Chronic sinusitis, unspecified: Secondary | ICD-10-CM | POA: Diagnosis not present

## 2021-01-25 DIAGNOSIS — B9689 Other specified bacterial agents as the cause of diseases classified elsewhere: Secondary | ICD-10-CM

## 2021-01-25 MED ORDER — PREDNISONE 20 MG PO TABS: 40.0000 mg | ORAL_TABLET | Freq: Every day | ORAL | 0 refills | Status: AC

## 2021-01-25 MED ORDER — DOXYCYCLINE HYCLATE 100 MG PO CAPS: 100.0000 mg | ORAL_CAPSULE | Freq: Two times a day (BID) | ORAL | 0 refills | Status: AC

## 2021-01-25 NOTE — Progress Notes (Unsigned)
We are sorry that you are not feeling well.  Here is how we plan to help!  Based on what you have shared with me it looks like you have sinusitis.  Sinusitis is inflammation and infection in the sinus cavities of the head.  Based on your presentation I believe you most likely have Acute Bacterial Sinusitis.  This is an infection caused by bacteria and is treated with antibiotics. I have prescribed Doxycycline 172m by mouth twice a day for 10 days. You have also been prescribed prednisone 424monce daily for 5 days. You may use an oral decongestant such as Mucinex D or if you have glaucoma or high blood pressure use plain Mucinex. Saline nasal spray help and can safely be used as often as needed for congestion.  If you develop worsening sinus pain, fever or notice severe headache and vision changes, or if symptoms are not better after completion of antibiotic, please schedule an appointment with a health care provider.    Sinus infections are not as easily transmitted as other respiratory infection, however we still recommend that you avoid close contact with loved ones, especially the very young and elderly.  Remember to wash your hands thoroughly throughout the day as this is the number one way to prevent the spread of infection!  Home Care:  Only take medications as instructed by your medical team.  Complete the entire course of an antibiotic.  Do not take these medications with alcohol.  A steam or ultrasonic humidifier can help congestion.  You can place a towel over your head and breathe in the steam from hot water coming from a faucet.  Avoid close contacts especially the very young and the elderly.  Cover your mouth when you cough or sneeze.  Always remember to wash your hands.  Get Help Right Away If:  You develop worsening fever or sinus pain.  You develop a severe head ache or visual changes.  Your symptoms persist after you have completed your treatment plan.  Make sure  you  Understand these instructions.  Will watch your condition.  Will get help right away if you are not doing well or get worse.  Your e-visit answers were reviewed by a board certified advanced clinical practitioner to complete your personal care plan.  Depending on the condition, your plan could have included both over the counter or prescription medications.  If there is a problem please reply  once you have received a response from your provider.  Your safety is important to [place] If you have drug allergies check your prescription carefully.    You can use MyChart to ask questions about today's visit, request a non-urgent call back, or ask for a work or school excuse for 24 hours related to this e-Visit. If it has been greater than 24 hours you will need to follow up with your provider, or enter a new e-Visit to address those concerns.  You will get an e-mail in the next two days asking about your experience.  I hope that your e-visit has been valuable and will speed your recovery. Thank you for using e-visits.  Greater than 5 minutes, yet less than 10 minutes of time have been spent researching, coordinating, and implementing care for this patient today.

## 2021-02-01 MED ORDER — PREDNISONE 10 MG PO TABS: ORAL_TABLET | ORAL | 0 refills | Status: DC

## 2021-02-01 NOTE — Telephone Encounter (Signed)
Dr. Lamonte Sakai, please advise on daily prednisone. Send message back to triage

## 2021-02-01 NOTE — Telephone Encounter (Signed)
Have her start 26m qd x 5 days nad then decrease to 1102mqd and stay on that dose. She'll need OV with me or APP soon after to assess.

## 2021-02-19 ENCOUNTER — Other Ambulatory Visit: Payer: Self-pay | Admitting: Emergency Medicine

## 2021-03-03 ENCOUNTER — Telehealth: Payer: 59 | Admitting: Physician Assistant

## 2021-03-03 ENCOUNTER — Encounter: Payer: Self-pay | Admitting: Physician Assistant

## 2021-03-03 DIAGNOSIS — Z20822 Contact with and (suspected) exposure to covid-19: Secondary | ICD-10-CM | POA: Diagnosis not present

## 2021-03-03 DIAGNOSIS — G4489 Other headache syndrome: Secondary | ICD-10-CM | POA: Diagnosis not present

## 2021-03-03 DIAGNOSIS — R059 Cough, unspecified: Secondary | ICD-10-CM

## 2021-03-03 DIAGNOSIS — R0981 Nasal congestion: Secondary | ICD-10-CM

## 2021-03-03 MED ORDER — BENZONATATE 100 MG PO CAPS: 100.0000 mg | ORAL_CAPSULE | Freq: Three times a day (TID) | ORAL | 0 refills | Status: DC | PRN

## 2021-03-03 MED ORDER — AZELASTINE HCL 0.1 % NA SOLN: 2.0000 | Freq: Two times a day (BID) | NASAL | 12 refills | Status: DC

## 2021-03-03 NOTE — Progress Notes (Signed)
E-Visit for Corona Virus Screening  Your current symptoms could be consistent with the coronavirus.  Many health care providers can now test patients at their office but not all are.  [place] has multiple testing sites. For information on our [place] testing locations and hours go to [email or web address]  We are enrolling you in our [facility] for [place] . Daily you will receive a questionnaire within the [place] website. Our COVID 19 response team will be monitoring your responses daily.  Testing Information: The COVID-19 Community Testing sites are testing BY APPOINTMENT ONLY.  You can schedule online at [email or web address]  If you do not have access to a smart phone or computer you may call [phone] for an appointment.   Additional testing sites in the Community:  . For [facility] Testing sites in [facility]  [email or web address]  . For Pop-up testing sites in [place]  BowlDirectory.[email or web address]  . For [facility] [email or web address]  . For [facility] testing in [place] and [employer] [email or web address]  . For [facility] testing in [facility]   [email or web address]  For  more information about community testing call [phone]   Please quarantine yourself while awaiting your test results. Please stay home for a minimum of 10 days from the first day of illness with improving symptoms and you have had 24 hours of no fever (without the use of Tylenol (Acetaminophen) Motrin (Ibuprofen) or any fever reducing medication).  Also - Do not get tested prior to returning to work because once you have had a positive test the test can stay  positive for more than a month in some cases.   You should wear a mask or cloth face covering over your nose and mouth if you must be around other people or animals, including pets (even at home). Try to stay at least 6 feet away from other people. This will protect the people around you.  Please continue good preventive care measures, including:  frequent hand-washing, avoid touching your face, cover coughs/sneezes, stay out of crowds and keep a 6 foot distance from others.  COVID-19 is a respiratory illness with symptoms that are similar to the flu. Symptoms are typically mild to moderate, but there have been cases of severe illness and death due to the virus.   The following symptoms may appear 2-14 days after exposure: . Fever . Cough . Shortness of breath or difficulty breathing . Chills . Repeated shaking with chills . Muscle pain . Headache . Sore throat . New loss of taste or smell . Fatigue . Congestion or runny nose . Nausea or vomiting . Diarrhea  Go to the nearest hospital ED for assessment if fever/cough/breathlessness are severe or illness seems like a threat to life.  It is vitally important that if you feel that you have an infection such as this virus or any other virus that you stay home and away from places where you may spread it to others.  You should avoid contact with people age 45 and older.   You can use medication such as prescription cough medication called Tessalon Perles 100 mg. You may take 1-2 capsules every 8 hours as needed for cough and prescription for Azelastine nasal spray 2 sprays in each nostril twice per day   If you have already been tested- you dont need to retest.    You may also take acetaminophen (Tylenol) as needed for fever.  Reduce your risk of any infection by using the same precautions used for avoiding  the common cold or flu:  Marland Kitchen Wash your hands often with soap and warm water for at least 20 seconds.  If soap and water are not readily  available, use an alcohol-based hand sanitizer with at least 60% alcohol.  . If coughing or sneezing, cover your mouth and nose by coughing or sneezing into the elbow areas of your shirt or coat, into a tissue or into your sleeve (not your hands). . Avoid shaking hands with others and consider head nods or verbal greetings only. . Avoid touching your eyes, nose, or mouth with unwashed hands.  . Avoid close contact with people who are sick. . Avoid places or events with large numbers of people in one location, like concerts or sporting events. . Carefully consider travel plans you have or are making. . If you are planning any travel outside or inside the [place], visit the CDC's Travelers' Health webpage for the latest health notices. . If you have some symptoms but not all symptoms, continue to monitor at home and seek medical attention if your symptoms worsen. . If you are having a medical emergency, call 911.  HOME CARE . Only take medications as instructed by your medical team. . Drink plenty of fluids and get plenty of rest. . A steam or ultrasonic humidifier can help if you have congestion.   GET HELP RIGHT AWAY IF YOU HAVE EMERGENCY WARNING SIGNS** FOR COVID-19. If you or someone is showing any of these signs seek emergency medical care immediately. Call 911 or proceed to your closest emergency facility if: . You develop worsening high fever. . Trouble breathing . Bluish lips or face . Persistent pain or pressure in the chest . New confusion . Inability to wake or stay awake . You cough up blood. . Your symptoms become more severe  **This list is not all possible symptoms. Contact your medical provider for any symptoms that are sever or concerning to you.  MAKE SURE YOU   Understand these instructions.  Will watch your condition.  Will get help right away if you are not doing well or get worse.  Your e-visit answers were reviewed by a board certified advanced clinical  practitioner to complete your personal care plan.  Depending on the condition, your plan could have included both over the counter or prescription medications.  If there is a problem please reply once you have received a response from your provider.  Your safety is important to [place].  If you have drug allergies check your prescription carefully.    You can use MyChart to ask questions about today's visit, request a non-urgent call back, or ask for a work or school excuse for 24 hours related to this e-Visit. If it has been greater than 24 hours you will need to follow up with your provider, or enter a new e-Visit to address those concerns. You will get an e-mail in the next two days asking about your experience.  I hope that your e-visit has been valuable and will speed your recovery. Thank you for using e-visits.   I spent 5-10 minutes on review and completion of this note- Lacy Duverney [facility]

## 2021-03-05 MED ORDER — IPRATROPIUM BROMIDE 0.03 % NA SOLN: 2.0000 | Freq: Two times a day (BID) | NASAL | 0 refills | Status: DC

## 2021-03-05 NOTE — Addendum Note (Signed)
Addended by: Noe Gens on: 03/05/2021 11:45 AM   Modules accepted: Orders

## 2021-03-07 ENCOUNTER — Other Ambulatory Visit: Payer: Self-pay

## 2021-03-07 ENCOUNTER — Ambulatory Visit (INDEPENDENT_AMBULATORY_CARE_PROVIDER_SITE_OTHER): Payer: 59 | Admitting: Physician Assistant

## 2021-03-07 ENCOUNTER — Encounter: Payer: Self-pay | Admitting: Physician Assistant

## 2021-03-07 VITALS — BP 108/74 | HR 80 | Temp 97.4°F | Ht 61.0 in | Wt 231.2 lb

## 2021-03-07 DIAGNOSIS — Z0000 Encounter for general adult medical examination without abnormal findings: Secondary | ICD-10-CM

## 2021-03-07 DIAGNOSIS — R5383 Other fatigue: Secondary | ICD-10-CM

## 2021-03-07 DIAGNOSIS — Z131 Encounter for screening for diabetes mellitus: Secondary | ICD-10-CM | POA: Diagnosis not present

## 2021-03-07 DIAGNOSIS — E559 Vitamin D deficiency, unspecified: Secondary | ICD-10-CM

## 2021-03-07 DIAGNOSIS — K219 Gastro-esophageal reflux disease without esophagitis: Secondary | ICD-10-CM

## 2021-03-07 DIAGNOSIS — J4540 Moderate persistent asthma, uncomplicated: Secondary | ICD-10-CM

## 2021-03-07 DIAGNOSIS — Z13220 Encounter for screening for lipoid disorders: Secondary | ICD-10-CM

## 2021-03-07 DIAGNOSIS — F419 Anxiety disorder, unspecified: Secondary | ICD-10-CM

## 2021-03-07 LAB — CBC WITH DIFFERENTIAL/PLATELET
Basophils Absolute: 0.1 10*3/uL (ref 0.0–0.1)
Basophils Relative: 0.8 % (ref 0.0–3.0)
Eosinophils Absolute: 0.4 10*3/uL (ref 0.0–0.7)
Eosinophils Relative: 3.7 % (ref 0.0–5.0)
HCT: 44.4 % (ref 36.0–46.0)
Hemoglobin: 14.6 g/dL (ref 12.0–15.0)
Lymphocytes Relative: 38.3 % (ref 12.0–46.0)
Lymphs Abs: 4 10*3/uL (ref 0.7–4.0)
MCHC: 32.8 g/dL (ref 30.0–36.0)
MCV: 91.7 fl (ref 78.0–100.0)
Monocytes Absolute: 1 10*3/uL (ref 0.1–1.0)
Monocytes Relative: 9.7 % (ref 3.0–12.0)
Neutro Abs: 5 10*3/uL (ref 1.4–7.7)
Neutrophils Relative %: 47.5 % (ref 43.0–77.0)
Platelets: 285 10*3/uL (ref 150.0–400.0)
RBC: 4.84 Mil/uL (ref 3.87–5.11)
RDW: 14.3 % (ref 11.5–15.5)
WBC: 10.5 10*3/uL (ref 4.0–10.5)

## 2021-03-07 LAB — COMPREHENSIVE METABOLIC PANEL
ALT: 15 U/L (ref 0–35)
AST: 12 U/L (ref 0–37)
Albumin: 4.2 g/dL (ref 3.5–5.2)
Alkaline Phosphatase: 61 U/L (ref 39–117)
BUN: 13 mg/dL (ref 6–23)
CO2: 25 mEq/L (ref 19–32)
Calcium: 9.2 mg/dL (ref 8.4–10.5)
Chloride: 107 mEq/L (ref 96–112)
Creatinine, Ser: 0.86 mg/dL (ref 0.40–1.20)
GFR: 81.86 mL/min (ref >60.00)
Glucose, Bld: 94 mg/dL (ref 70–99)
Potassium: 3.8 mEq/L (ref 3.5–5.1)
Sodium: 142 mEq/L (ref 135–145)
Total Bilirubin: 0.8 mg/dL (ref 0.2–1.2)
Total Protein: 6.3 g/dL (ref 6.0–8.3)

## 2021-03-07 LAB — LIPID PANEL
Cholesterol: 216 mg/dL — ABNORMAL HIGH (ref 0–200)
HDL: 52.2 mg/dL (ref >39.00)
LDL Cholesterol: 127 mg/dL — ABNORMAL HIGH (ref 0–99)
NonHDL: 163.41
Total CHOL/HDL Ratio: 4
Triglycerides: 184 mg/dL — ABNORMAL HIGH (ref 0.0–149.0)
VLDL: 36.8 mg/dL (ref 0.0–40.0)

## 2021-03-07 LAB — VITAMIN D 25 HYDROXY (VIT D DEFICIENCY, FRACTURES): VITD: 51.28 ng/mL (ref 30.00–100.00)

## 2021-03-07 LAB — TSH: TSH: 2.31 u[IU]/mL (ref 0.35–4.50)

## 2021-03-07 MED ORDER — HYDROXYZINE HCL 25 MG PO TABS: 25.0000 mg | ORAL_TABLET | Freq: Three times a day (TID) | ORAL | 0 refills | Status: DC | PRN

## 2021-03-07 NOTE — Patient Instructions (Signed)
Very good to meet you today! Please go to the lab and I will call or MyChart with results. Try the hydroxyzine as needed for anxiety or itching. May use OTC Hydrocortisone 1% cream for any itching areas.  See you back in 1 year or sooner if any concerns.   Preventive Care 40-45 Years Old, Female Preventive care refers to lifestyle choices and visits with your health care provider that can promote health and wellness. This includes:  A yearly physical exam. This is also called an annual wellness visit.  Regular dental and eye exams.  Immunizations.  Screening for certain conditions.  Healthy lifestyle choices, such as: ? Eating a healthy diet. ? Getting regular exercise. ? Not using drugs or products that contain nicotine and tobacco. ? Limiting alcohol use. What can I expect for my preventive care visit? Physical exam Your health care provider will check your:  Height and weight. These may be used to calculate your BMI (body mass index). BMI is a measurement that tells if you are at a healthy weight.  Heart rate and blood pressure.  Body temperature.  Skin for abnormal spots. Counseling Your health care provider may ask you questions about your:  Past medical problems.  Family's medical history.  Alcohol, tobacco, and drug use.  Emotional well-being.  Home life and relationship well-being.  Sexual activity.  Diet, exercise, and sleep habits.  Work and work Statistician.  Access to firearms.  Method of birth control.  Menstrual cycle.  Pregnancy history. What immunizations do I need? Vaccines are usually given at various ages, according to a schedule. Your health care provider will recommend vaccines for you based on your age, medical history, and lifestyle or other factors, such as travel or where you work.   What tests do I need? Blood tests  Lipid and cholesterol levels. These may be checked every 5 years, or more often if you are over 54 years  old.  Hepatitis C test.  Hepatitis B test. Screening  Lung cancer screening. You may have this screening every year starting at age 26 if you have a 30-pack-year history of smoking and currently smoke or have quit within the past 15 years.  Colorectal cancer screening. ? All adults should have this screening starting at age 32 and continuing until age 34. ? Your health care provider may recommend screening at age 38 if you are at increased risk. ? You will have tests every 1-10 years, depending on your results and the type of screening test.  Diabetes screening. ? This is done by checking your blood sugar (glucose) after you have not eaten for a while (fasting). ? You may have this done every 1-3 years.  Mammogram. ? This may be done every 1-2 years. ? Talk with your health care provider about when you should start having regular mammograms. This may depend on whether you have a family history of breast cancer.  BRCA-related cancer screening. This may be done if you have a family history of breast, ovarian, tubal, or peritoneal cancers.  Pelvic exam and Pap test. ? This may be done every 3 years starting at age 22. ? Starting at age 2, this may be done every 5 years if you have a Pap test in combination with an HPV test. Other tests  STD (sexually transmitted disease) testing, if you are at risk.  Bone density scan. This is done to screen for osteoporosis. You may have this scan if you are at high risk for osteoporosis.  Talk with your health care provider about your test results, treatment options, and if necessary, the need for more tests. Follow these instructions at home: Eating and drinking  Eat a diet that includes fresh fruits and vegetables, whole grains, lean protein, and low-fat dairy products.  Take vitamin and mineral supplements as recommended by your health care provider.  Do not drink alcohol if: ? Your health care provider tells you not to drink. ? You are  pregnant, may be pregnant, or are planning to become pregnant.  If you drink alcohol: ? Limit how much you have to 0-1 drink a day. ? Be aware of how much alcohol is in your drink. In the [place] one drink equals one 12 oz bottle of beer (355 mL), one 5 oz glass of wine (148 mL), or one 1 oz glass of hard liquor (44 mL).   Lifestyle  Take daily care of your teeth and gums. Brush your teeth every morning and night with fluoride toothpaste. Floss one time each day.  Stay active. Exercise for at least 30 minutes 5 or more days each week.  Do not use any products that contain nicotine or tobacco, such as cigarettes, e-cigarettes, and chewing tobacco. If you need help quitting, ask your health care provider.  Do not use drugs.  If you are sexually active, practice safe sex. Use a condom or other form of protection to prevent STIs (sexually transmitted infections).  If you do not wish to become pregnant, use a form of birth control. If you plan to become pregnant, see your health care provider for a prepregnancy visit.  If told by your health care provider, take low-dose aspirin daily starting at age 23.  Find healthy ways to cope with stress, such as: ? Meditation, yoga, or listening to music. ? Journaling. ? Talking to a trusted person. ? Spending time with friends and family. Safety  Always wear your seat belt while driving or riding in a vehicle.  Do not drive: ? If you have been drinking alcohol. Do not ride with someone who has been drinking. ? When you are tired or distracted. ? While texting.  Wear a helmet and other protective equipment during sports activities.  If you have firearms in your house, make sure you follow all gun safety procedures. What's next?  Visit your health care provider once a year for an annual wellness visit.  Ask your health care provider how often you should have your eyes and teeth checked.  Stay up to date on all vaccines. This information is  not intended to replace advice given to you by your health care provider. Make sure you discuss any questions you have with your health care provider. Document Revised: 09/05/2020 Document Reviewed: 08/13/2018 Elsevier Patient Education  2021 [employer].

## 2021-03-07 NOTE — Progress Notes (Signed)
New Patient Office Visit  Subjective:  Patient ID: Katrina Roman, female    DOB: [birth year]  Age: 45 y.o. MRN: [id number]  CC:  Chief Complaint  Patient presents with  . Annual Exam    HPI Katrina Roman presents for new patient establishment and annual CPE.  Acute concerns: Sleeping concerns / intermittent increase in anxiety over the last few months. Moved from [place] (originally from [place]). Going through a divorce. No kids. Enjoys her job. Start to have some itching around her arms, which she also attributes to stress.   Health maintenance: Lifestyle/ exercise: Walking daily, varies distance  Nutrition: Cooking at home Mental health: Feels more on edge the past few months, trouble sleeping  Caffeine: One cup coffee per day Sleep: Not sleeping well. Staying asleep is difficult. Melatonin does not seem to help. She does nap during the day about 30 minutes.  Substance use: None  Sexual activity: Currently active, no concerns  Immunizations: Did not do COVID vaccines, she will check on Tdap  Colonoscopy: Will start at age 67 if insurance approves; no fam hx, no personal hx of cancer Pap: 02/26/21 - normal - Dr. Adah Perl in [place] Mammogram: December 2021 - normal per patient  Asthma since 1995 - Sees Dr. Lamonte Sakai. She was hospitalized in Oct 2021 for about 1 week 2/2 asthma related issues.    Past Medical History:  Diagnosis Date  . Allergy   . Anxiety   . Asthma   . GERD (gastroesophageal reflux disease)   . Peripheral eosinophilia   . PUD (peptic ulcer disease)   . Seasonal allergies     Past Surgical History:  Procedure Laterality Date  . ESOPHAGOGASTRODUODENOSCOPY  08/2010   noraml esophagus,3 1-mm superficial gastric ulcers seen in the antrum, No evidence H pylori  . FOOT SURGERY     Right; three screws,chronic frature  . NASAL SINUS SURGERY  1997 and 2009   x 2  . TONSILLECTOMY      Family History  Problem Relation Age of Onset  . Skin cancer Maternal  Grandfather   . Hypertension Father   . Diabetes Father   . Hypertension Mother     Social History   Socioeconomic History  . Marital status: Legally Separated    Spouse name: Not on file  . Number of children: Not on file  . Years of education: Not on file  . Highest education level: Not on file  Occupational History  . Not on file  Tobacco Use  . Smoking status: Never Smoker  . Smokeless tobacco: Never Used  Vaping Use  . Vaping Use: Never used  Substance and Sexual Activity  . Alcohol use: No  . Drug use: No  . Sexual activity: Yes  Other Topics Concern  . Not on file  Social History Narrative  . Not on file   Social Determinants of Health   Financial Resource Strain: Not on file  Food Insecurity: Not on file  Transportation Needs: Not on file  Physical Activity: Not on file  Stress: Not on file  Social Connections: Not on file  Intimate Partner Violence: Not on file    ROS Review of Systems  Constitutional: Negative for activity change, appetite change, fever and unexpected weight change.  HENT: Negative for congestion.   Eyes: Negative for visual disturbance.  Respiratory: Negative for apnea, cough and shortness of breath.   Cardiovascular: Negative for chest pain, palpitations and leg swelling.  Gastrointestinal: Negative for abdominal pain, blood  in stool, constipation and diarrhea.  Endocrine: Negative for polydipsia, polyphagia and polyuria.  Genitourinary: Negative for dysuria and pelvic pain.  Musculoskeletal: Negative for arthralgias.  Skin: Negative for rash.  Neurological: Negative for dizziness, weakness and headaches.  Hematological: Negative for adenopathy. Does not bruise/bleed easily.  Psychiatric/Behavioral: Positive for sleep disturbance. Negative for suicidal ideas. The patient is nervous/anxious.     Objective:   Today's Vitals: BP 108/74   Pulse 80   Temp (!) 97.4 F (36.3 C)   Ht 5' 1"  (1.549 m)   Wt 231 lb 3.2 oz (104.9 kg)    SpO2 97%   BMI 43.68 kg/m   Physical Exam Vitals and nursing note reviewed.  Constitutional:      Appearance: Normal appearance. She is obese. She is not toxic-appearing.  HENT:     Head: Normocephalic and atraumatic.     Right Ear: Tympanic membrane, ear canal and external ear normal.     Left Ear: Tympanic membrane, ear canal and external ear normal.     Nose: Nose normal.     Mouth/Throat:     Mouth: Mucous membranes are moist.  Eyes:     Extraocular Movements: Extraocular movements intact.     Conjunctiva/sclera: Conjunctivae normal.     Pupils: Pupils are equal, round, and reactive to light.  Cardiovascular:     Rate and Rhythm: Normal rate and regular rhythm.     Pulses: Normal pulses.     Heart sounds: Normal heart sounds.  Pulmonary:     Effort: Pulmonary effort is normal.     Breath sounds: Normal breath sounds.  Abdominal:     General: Abdomen is flat. Bowel sounds are normal.     Palpations: Abdomen is soft.  Musculoskeletal:        General: Normal range of motion.     Cervical back: Normal range of motion and neck supple.  Skin:    General: Skin is warm and dry.  Neurological:     General: No focal deficit present.     Mental Status: She is alert and oriented to person, place, and time.  Psychiatric:        Mood and Affect: Mood normal.        Behavior: Behavior normal.        Thought Content: Thought content normal.        Judgment: Judgment normal.     Assessment & Plan:   Problem List Items Addressed This Visit      Respiratory   Asthma     Digestive   GERD    Other Visit Diagnoses    Encounter for annual physical exam    -  Primary   Relevant Orders   CBC with Differential/Platelet   Comprehensive metabolic panel   Lipid panel   TSH   VITAMIN D 25 Hydroxy (Vit-D Deficiency, Fractures)   Morbid obesity (HCC)       Diabetes mellitus screening       Relevant Orders   Comprehensive metabolic panel   Screening for cholesterol level        Relevant Orders   Lipid panel   Other fatigue       Relevant Orders   Comprehensive metabolic panel   TSH   VITAMIN D 25 Hydroxy (Vit-D Deficiency, Fractures)   Vitamin D deficiency       Relevant Orders   VITAMIN D 25 Hydroxy (Vit-D Deficiency, Fractures)      Outpatient Encounter Medications as of 03/07/2021  Medication Sig  . albuterol (PROVENTIL) (2.5 MG/3ML) 0.083% nebulizer solution Take 3 mLs (2.5 mg total) by nebulization every 6 (six) hours as needed for wheezing or shortness of breath.  . hydrOXYzine (ATARAX/VISTARIL) 25 MG tablet Take 1 tablet (25 mg total) by mouth 3 (three) times daily as needed. Use for itching or anxiety.  . montelukast (SINGULAIR) 10 MG tablet TAKE 1 TABLET BY MOUTH EVERYDAY AT BEDTIME  . pantoprazole (PROTONIX) 40 MG tablet Take 1 tablet (40 mg total) by mouth daily.  . predniSONE (DELTASONE) 10 MG tablet TAKE 2 TABS DAILY X 5 DAYS THEN ONE DAILY THEREAFTER.  . VENTOLIN HFA 108 (90 Base) MCG/ACT inhaler Inhale 2 puffs into the lungs every 6 (six) hours as needed for wheezing or shortness of breath.  Marland Kitchen VITAMIN D PO Take 1,250 mcg by mouth.  . [DISCONTINUED] benzonatate (TESSALON) 100 MG capsule Take 1 capsule (100 mg total) by mouth 3 (three) times daily as needed.  . [DISCONTINUED] ergocalciferol (VITAMIN D2) 1.25 MG (50000 UT) capsule ergocalciferol (vitamin D2) 1,250 mcg (50,000 unit) capsule  . [DISCONTINUED] Fluticasone Propionate (XHANCE) 93 MCG/ACT EXHU Place 2 sprays into the nose in the morning and at bedtime.  . [DISCONTINUED] ipratropium (ATROVENT) 0.03 % nasal spray Place 2 sprays into both nostrils every 12 (twelve) hours.  . [DISCONTINUED] loratadine (CLARITIN) 10 MG tablet Take 10 mg by mouth daily.  . [DISCONTINUED] loratadine-pseudoephedrine (CLARITIN-D 24 HOUR) 10-240 MG 24 hr tablet Take 1 tablet by mouth daily.  . [DISCONTINUED] omeprazole (PRILOSEC) 20 MG capsule Take 1 capsule (20 mg total) by mouth daily.  . [DISCONTINUED]  Spacer/Aero-Holding Chambers (AEROCHAMBER MV) inhaler Use as instructed   No facility-administered encounter medications on file as of 03/07/2021.    Follow-up: Return in about 1 year (around 03/07/2022) for CPE and fasting labs .   1. Encounter for annual physical exam Age-appropriate screening and counseling performed today. Will check labs and call with results.   2. Gastroesophageal reflux disease without esophagitis Stable on Protonix 40 mg daily.  3. Moderate persistent asthma without complication Sees Dr. Lamonte Sakai. Stable on current regimen.  4. Morbid obesity ([place]) Discussed increasing exercise and decreasing calories. Consider referral to Healthy Weight clinic.   5. Diabetes mellitus screening 6. Screening for cholesterol level 7. Other fatigue  8. Vitamin D deficiency Labs today, will call with results.    9. Anxiety Likely contributing to her difficulty sleeping. Will try hydroxyzine prn as directed. Risks vs benefits discussed. It will also be in her benefit to look at other stress reducing measures such as increasing exercise, yoga, stretches, meditation, etc.    Randa Evens Skila Rollins, PA-C

## 2021-03-10 ENCOUNTER — Other Ambulatory Visit: Payer: Self-pay | Admitting: Emergency Medicine

## 2021-03-12 ENCOUNTER — Encounter: Payer: Self-pay | Admitting: Physician Assistant

## 2021-03-12 ENCOUNTER — Other Ambulatory Visit: Payer: Self-pay

## 2021-03-12 ENCOUNTER — Telehealth: Payer: 59 | Admitting: Emergency Medicine

## 2021-03-12 DIAGNOSIS — R0981 Nasal congestion: Secondary | ICD-10-CM | POA: Diagnosis not present

## 2021-03-12 DIAGNOSIS — Z1211 Encounter for screening for malignant neoplasm of colon: Secondary | ICD-10-CM

## 2021-03-12 MED ORDER — SALINE SPRAY 0.65 % NA SOLN: 1.0000 | NASAL | 0 refills | Status: DC | PRN

## 2021-03-12 MED ORDER — IPRATROPIUM BROMIDE 0.03 % NA SOLN: 2.0000 | Freq: Two times a day (BID) | NASAL | 0 refills | Status: DC

## 2021-03-12 NOTE — Progress Notes (Signed)
We are sorry that you are not feeling well.  Here is how we plan to help!  Based on what you have shared with me it looks like you have sinusitis.  Sinusitis is inflammation and infection in the sinus cavities of the head.  Based on your presentation I believe you most likely have Acute Viral Sinusitis.This is an infection most likely caused by a virus. There is not specific treatment for viral sinusitis other than to help you with the symptoms until the infection runs its course.  You may use an oral decongestant such as Mucinex D or if you have glaucoma or high blood pressure use plain Mucinex. Saline nasal spray help and can safely be used as often as needed for congestion, I have prescribed: Ipratropium Bromide nasal spray 0.03% 2 sprays in eah nostril 2-3 times a day   Antibiotics are not indicated at this point.  If you are still having symptoms at day 10, or if you spike a high fever, we might reconsider.  Providers prescribe antibiotics to treat infections caused by bacteria. Antibiotics are very powerful in treating bacterial infections when they are used properly. To maintain their effectiveness, they should be used only when necessary. Overuse of antibiotics has resulted in the development of superbugs that are resistant to treatment!    After careful review of your answers, I would not recommend an antibiotic for your condition.  Antibiotics are not effective against viruses and therefore should not be used to treat them. Common examples of infections caused by viruses include colds and flu  Some authorities believe that zinc sprays or the use of Echinacea may shorten the course of your symptoms.  Sinus infections are not as easily transmitted as other respiratory infection, however we still recommend that you avoid close contact with loved ones, especially the very young and elderly.  Remember to wash your hands thoroughly throughout the day as this is the number one way to prevent the  spread of infection!  Home Care:  Only take medications as instructed by your medical team.  Do not take these medications with alcohol.  A steam or ultrasonic humidifier can help congestion.  You can place a towel over your head and breathe in the steam from hot water coming from a faucet.  Avoid close contacts especially the very young and the elderly.  Cover your mouth when you cough or sneeze.  Always remember to wash your hands.  Get Help Right Away If:  You develop worsening fever or sinus pain.  You develop a severe head ache or visual changes.  Your symptoms persist after you have completed your treatment plan.  Make sure you  Understand these instructions.  Will watch your condition.  Will get help right away if you are not doing well or get worse.  Your e-visit answers were reviewed by a board certified advanced clinical practitioner to complete your personal care plan.  Depending on the condition, your plan could have included both over the counter or prescription medications.  If there is a problem please reply  once you have received a response from your provider.  Your safety is important to [place].  If you have drug allergies check your prescription carefully.    You can use MyChart to ask questions about today's visit, request a non-urgent call back, or ask for a work or school excuse for 24 hours related to this e-Visit. If it has been greater than 24 hours you will need to follow up  with your provider, or enter a new e-Visit to address those concerns.  You will get an e-mail in the next two days asking about your experience.  I hope that your e-visit has been valuable and will speed your recovery. Thank you for using e-visits.   Approximately 5 minutes was used in reviewing the patient's chart, questionnaire, prescribing medications, and documentation.

## 2021-03-28 ENCOUNTER — Encounter: Payer: Self-pay | Admitting: Physician Assistant

## 2021-03-28 ENCOUNTER — Other Ambulatory Visit: Payer: Self-pay

## 2021-03-29 ENCOUNTER — Other Ambulatory Visit: Payer: Self-pay

## 2021-03-29 MED ORDER — ESCITALOPRAM OXALATE 10 MG PO TABS: 10.0000 mg | ORAL_TABLET | Freq: Every day | ORAL | 0 refills | Status: DC

## 2021-03-29 NOTE — Telephone Encounter (Signed)
Patient notified voices understanding

## 2021-04-02 LAB — COLOGUARD: Cologuard: NEGATIVE

## 2021-04-06 LAB — COLOGUARD: COLOGUARD: NEGATIVE

## 2021-04-06 LAB — EXTERNAL GENERIC LAB PROCEDURE: COLOGUARD: NEGATIVE

## 2021-04-12 ENCOUNTER — Other Ambulatory Visit: Payer: Self-pay

## 2021-04-12 ENCOUNTER — Encounter: Payer: Self-pay | Admitting: Emergency Medicine

## 2021-04-12 ENCOUNTER — Ambulatory Visit: Payer: 59 | Admitting: Emergency Medicine

## 2021-04-12 DIAGNOSIS — J45909 Unspecified asthma, uncomplicated: Secondary | ICD-10-CM

## 2021-04-12 DIAGNOSIS — G4733 Obstructive sleep apnea (adult) (pediatric): Secondary | ICD-10-CM | POA: Diagnosis not present

## 2021-04-12 DIAGNOSIS — G478 Other sleep disorders: Secondary | ICD-10-CM

## 2021-04-12 NOTE — Assessment & Plan Note (Signed)
Untreated and possibly contributing to her UA instability, irritation. Discussed working with dentistry to get dental device.

## 2021-04-12 NOTE — Assessment & Plan Note (Signed)
Continue albuterol prn. Hold off on scheduled BD or ICS - her sx are principally UA in nature

## 2021-04-12 NOTE — Progress Notes (Signed)
Subjective:    Patient ID: Katrina Roman, female    DOB: 1976/03/31, 45 y.o.   MRN: [id number]  Asthma She complains of cough, shortness of breath and wheezing. Pertinent negatives include no ear pain, fever, headaches, postnasal drip, rhinorrhea, sneezing, sore throat or trouble swallowing. Her past medical history is significant for asthma.    ROV 01/02/21 --45 year old woman with chronic cough in the setting of mild intermittent asthma but significant upper airway instability syndrome, both exacerbated by chronic allergic rhinitis (negative skin testing), nasal polyposis.  She also has mild obstructive sleep apnea, GERD. She is currently managed on saline nasal spray, Singulair, loratadine.  Not on flonase right now. She has albuterol which she uses multiple times a day with only marginal relief.  She was treated with a prednisone taper beginning 12/12/2020 for increased congestion, cough, wheeze.  She was better while she was on the prednisone, now reports that she is beginning to experience persistent mucous, dry cough, UA noise / wheeze that she hears the most at night when she is laying down.  She hasn't looked into dental device for OSA yet - is going to do this.   ROV 04/12/21 --this is a follow-up visit for 45 year old woman with upper airway instability and stridor.  Also probably superimposed mild intermittent asthma, both exacerbated by chronic allergic rhinitis, nasal polyposis and GERD.  She also has OSA.  She has frequent flaring and has been to a large degree prednisone dependent.  She did not have skin testing that would support immunotherapy, most recent IgE and eosinophil counts were both normal.  She denies any breakthrough GERD.  Currently on Protonix once daily, Singulair nightly.  She is not taking fluticasone nasal spray or loratadine right now She is interested in trying to get the dental device for obstructive sleep apnea     Review of Systems  Constitutional: Negative  for fever and unexpected weight change.  HENT: Negative for congestion, dental problem, ear pain, nosebleeds, postnasal drip, rhinorrhea, sinus pressure, sneezing, sore throat and trouble swallowing.   Eyes: Negative for redness and itching.  Respiratory: Positive for cough, shortness of breath and wheezing. Negative for chest tightness.   Cardiovascular: Negative for palpitations and leg swelling.  Gastrointestinal: Negative for nausea and vomiting.  Genitourinary: Negative for dysuria.  Musculoskeletal: Negative for joint swelling.  Skin: Negative for rash.  Neurological: Negative for headaches.  Hematological: Does not bruise/bleed easily.  Psychiatric/Behavioral: Negative for dysphoric mood. The patient is not nervous/anxious.        Objective:   Physical Exam Vitals:   04/12/21 1519  BP: 118/72  Pulse: 91  Temp: 97.7 F (36.5 C)  TempSrc: Temporal  SpO2: 95%  Weight: 238 lb 9.6 oz (108.2 kg)  Height: 5' 1"  (1.549 m)   Gen: Pleasant, overwt woman, in no distress,  normal affect  ENT: No lesions,  mouth clear,  oropharynx clear, no postnasal drip  Neck: No JVD, no stridor today  Lungs: No use of accessory muscles, UA noise much improved   Cardiovascular: RRR, heart sounds normal, no murmur or gallops, no peripheral edema  Musculoskeletal: No deformities, no cyanosis or clubbing  Neuro: alert, awake, non focal  Skin: Warm, no lesions or rash       Assessment & Plan:  Upper airway resistance syndrome Please continue prednisone 10 mg daily for 4 more weeks.  At that time decrease to 5 mg daily for 2 weeks.  Then stop completely.  Once you have been  off your prednisone for at least a month we will repeat lab work (IgE, eosinophil count) Continue your Protonix daily as you have been taking it Continue your Singulair 10 mg each evening Restart either loratadine (Claritin) or cetirizine (Zyrtec) once daily. Start using OTC chlorpheniramine (chlor tabs) 4 mg in the  evening.  If well-tolerated you could start taking these twice a day. Pursue getting the dental device for your obstructive sleep apnea Follow with Dr. Lamonte Sakai in mid July 2022.  Call if you have any problems  Asthma Continue albuterol prn. Hold off on scheduled BD or ICS - her sx are principally UA in nature  OSA (obstructive sleep apnea) Untreated and possibly contributing to her UA instability, irritation. Discussed working with dentistry to get dental device.    Baltazar Apo, MD, PhD 04/12/2021, 3:48 PM [facility] [phone] or if no answer [phone]

## 2021-04-12 NOTE — Patient Instructions (Addendum)
Please continue prednisone 10 mg daily for 4 more weeks.  At that time decrease to 5 mg daily for 2 weeks.  Then stop completely.  Once you have been off your prednisone for at least a month we will repeat lab work (IgE, eosinophil count) Continue your Protonix daily as you have been taking it Continue your Singulair 10 mg each evening Restart either loratadine (Claritin) or cetirizine (Zyrtec) once daily. Start using OTC chlorpheniramine (chlor tabs) 4 mg in the evening.  If well-tolerated you could start taking these twice a day. Pursue getting the dental device for your obstructive sleep apnea Follow with Dr. Lamonte Sakai in mid July 2022.  Call if you have any problems

## 2021-04-12 NOTE — Assessment & Plan Note (Signed)
Please continue prednisone 10 mg daily for 4 more weeks.  At that time decrease to 5 mg daily for 2 weeks.  Then stop completely.  Once you have been off your prednisone for at least a month we will repeat lab work (IgE, eosinophil count) Continue your Protonix daily as you have been taking it Continue your Singulair 10 mg each evening Restart either loratadine (Claritin) or cetirizine (Zyrtec) once daily. Start using OTC chlorpheniramine (chlor tabs) 4 mg in the evening.  If well-tolerated you could start taking these twice a day. Pursue getting the dental device for your obstructive sleep apnea Follow with Dr. Lamonte Sakai in mid July 2022.  Call if you have any problems

## 2021-04-24 ENCOUNTER — Other Ambulatory Visit: Payer: Self-pay | Admitting: *Deleted

## 2021-04-24 ENCOUNTER — Telehealth: Payer: Self-pay

## 2021-04-24 MED ORDER — ESCITALOPRAM OXALATE 10 MG PO TABS: 10.0000 mg | ORAL_TABLET | Freq: Every day | ORAL | 0 refills | Status: DC

## 2021-04-24 NOTE — Telephone Encounter (Signed)
  LAST APPOINTMENT DATE: 03/07/2021   NEXT APPOINTMENT DATE:@Visit  date not found  MEDICATION:escitalopram (LEXAPRO) 10 MG tablet  PHARMACY: [id number]- [place], [place] - [address]  Comments: Patient states medication has been working and would like a refill.   Please advise

## 2021-04-24 NOTE — Telephone Encounter (Signed)
Rx send to pharmacy

## 2021-05-03 ENCOUNTER — Other Ambulatory Visit: Payer: Self-pay | Admitting: Emergency Medicine

## 2021-05-03 MED ORDER — VENTOLIN HFA 108 (90 BASE) MCG/ACT IN AERS: 2.0000 | INHALATION_SPRAY | Freq: Four times a day (QID) | RESPIRATORY_TRACT | 3 refills | Status: DC | PRN

## 2021-05-15 ENCOUNTER — Encounter: Payer: Self-pay | Admitting: *Deleted

## 2021-05-15 MED ORDER — PREDNISONE 20 MG PO TABS: 20.0000 mg | ORAL_TABLET | Freq: Every day | ORAL | 0 refills | Status: DC

## 2021-05-15 MED ORDER — AZITHROMYCIN 250 MG PO TABS: ORAL_TABLET | ORAL | 0 refills | Status: DC

## 2021-05-15 NOTE — Telephone Encounter (Signed)
Katrina Roman, please see mychart thread sent by pt and advise.

## 2021-05-15 NOTE — Telephone Encounter (Signed)
Zpack #1 take as directed  Prednisone 3m daily for 5 days  Mucinex DM Twice daily  As needed  Cough/congestion  oow for rest of week If not improving will need ov for further eval and chest xray  Please contact office for sooner follow up if symptoms do not improve or worsen or seek emergency care

## 2021-05-16 NOTE — Telephone Encounter (Signed)
Thank you :)

## 2021-05-20 ENCOUNTER — Other Ambulatory Visit: Payer: Self-pay | Admitting: Emergency Medicine

## 2021-05-21 ENCOUNTER — Other Ambulatory Visit: Payer: Self-pay | Admitting: Physician Assistant

## 2021-06-05 ENCOUNTER — Encounter: Payer: Self-pay | Admitting: Physician Assistant

## 2021-06-06 MED ORDER — VENTOLIN HFA 108 (90 BASE) MCG/ACT IN AERS
2.0000 | INHALATION_SPRAY | Freq: Four times a day (QID) | RESPIRATORY_TRACT | 3 refills | Status: DC | PRN
Start: 2021-06-06 — End: 2021-07-30

## 2021-06-06 MED ORDER — MONTELUKAST SODIUM 10 MG PO TABS: ORAL_TABLET | ORAL | 3 refills | Status: DC

## 2021-06-06 MED ORDER — PANTOPRAZOLE SODIUM 40 MG PO TBEC: 40.0000 mg | DELAYED_RELEASE_TABLET | Freq: Every day | ORAL | 3 refills | Status: DC

## 2021-06-08 ENCOUNTER — Other Ambulatory Visit: Payer: Self-pay | Admitting: Emergency Medicine

## 2021-06-26 ENCOUNTER — Telehealth: Payer: 59 | Admitting: Family Medicine

## 2021-06-26 DIAGNOSIS — J4540 Moderate persistent asthma, uncomplicated: Secondary | ICD-10-CM

## 2021-06-26 NOTE — Progress Notes (Signed)
  Needs in person appt for best care

## 2021-07-03 ENCOUNTER — Encounter: Payer: Self-pay | Admitting: Physician Assistant

## 2021-07-03 ENCOUNTER — Telehealth (INDEPENDENT_AMBULATORY_CARE_PROVIDER_SITE_OTHER): Payer: Self-pay | Admitting: Physician Assistant

## 2021-07-03 VITALS — Ht 61.0 in

## 2021-07-03 DIAGNOSIS — J4541 Moderate persistent asthma with (acute) exacerbation: Secondary | ICD-10-CM

## 2021-07-03 MED ORDER — PREDNISONE 20 MG PO TABS: 20.0000 mg | ORAL_TABLET | Freq: Two times a day (BID) | ORAL | 0 refills | Status: AC

## 2021-07-03 NOTE — Patient Instructions (Signed)
Take medications as directed. F/up prn.

## 2021-07-03 NOTE — Progress Notes (Signed)
Virtual Visit via Video Note  I connected with Edmon Crape on 07/03/21 at 12:00 PM EDT by a video enabled telemedicine application and verified that I am speaking with the correct person using two identifiers.  Location: Patient: [facility] in [place], [place]  Provider: Therapist, music at [facility] present: Patient and myself   I discussed the limitations of evaluation and management by telemedicine and the availability of in person appointments. The patient expressed understanding and agreed to proceed.   History of Present Illness: Chief complaint: Problems with her asthma Symptom onset: 07/02/21 Pertinent positives: Wheezing, dry coughing, cough at night -  feels like normal asthma flare up for her Pertinent negatives: Fever, chills, body aches, n/v, headache, ST Treatments tried: Using albuterol nebulizer Sick exposure: No known sick contacts    Observations/Objective:   Gen: Awake, alert, no acute distress Resp: Breathing is even and non-labored. Speaking clearly in full sentences Psych: calm/pleasant demeanor Neuro: Alert and Oriented x 3, + facial symmetry, speech is clear.   Assessment and Plan: 1. Moderate persistent asthma with acute exacerbation Typical asthma-flare for patient, triggered by seasonal change. She will continue to use her albuterol nebs every 4-6 hours prn, as well as continue on her Singulair daily. Sent Rx prednisone 20 mg BID x 5 days for added relief. She will call back if no improvement. She knows to go to ED in case of any signs of respiratory distress.    Follow Up Instructions:    I discussed the assessment and treatment plan with the patient. The patient was provided an opportunity to ask questions and all were answered. The patient agreed with the plan and demonstrated an understanding of the instructions.   The patient was advised to call back or seek an in-person evaluation if the symptoms worsen or if the condition  fails to improve as anticipated.  Altonio Schwertner M Rhea Kaelin, PA-C

## 2021-07-03 NOTE — Telephone Encounter (Signed)
She is scheduled!

## 2021-07-09 ENCOUNTER — Ambulatory Visit: Payer: Self-pay | Admitting: Physician Assistant

## 2021-07-10 NOTE — Telephone Encounter (Signed)
Left voice message for patient to call clinic.  

## 2021-07-11 ENCOUNTER — Other Ambulatory Visit: Payer: Self-pay | Admitting: Emergency Medicine

## 2021-07-16 DIAGNOSIS — R000 Tachycardia, unspecified: Secondary | ICD-10-CM | POA: Insufficient documentation

## 2021-07-17 NOTE — Telephone Encounter (Signed)
Dr. Lamonte Sakai, please see mychart message sent by pt: Laren Everts, MD 12 minutes ago (11:50 AM)   Please advise Dr Lamonte Sakai that I was taken by ambulance to [facility] in [facility] night and am still in the hospital.   All of my tests and such should be available to him.   I currently am out of work(not because of this), and don't have any insurance so I am really not able to afford a visit with him, especially after this.  Does he have any sample medications I can have?  Especially my Inhaler.      I have made pt aware that we do not get samples of rescue inhalers and advised her to check with PCP about that.

## 2021-07-18 NOTE — Telephone Encounter (Signed)
Reviewed her med list. Unfortunately we do not have SABA samples. Doesn't look like we will be able to provide any samples.

## 2021-07-19 ENCOUNTER — Ambulatory Visit: Payer: Medicaid Other | Admitting: Physician Assistant

## 2021-07-30 ENCOUNTER — Encounter: Payer: Self-pay | Admitting: Physician Assistant

## 2021-07-30 ENCOUNTER — Ambulatory Visit (INDEPENDENT_AMBULATORY_CARE_PROVIDER_SITE_OTHER): Payer: Self-pay | Admitting: Physician Assistant

## 2021-07-30 ENCOUNTER — Other Ambulatory Visit: Payer: Self-pay

## 2021-07-30 VITALS — BP 118/83 | HR 96 | Temp 98.0°F | Ht 61.0 in | Wt 243.0 lb

## 2021-07-30 DIAGNOSIS — J4540 Moderate persistent asthma, uncomplicated: Secondary | ICD-10-CM

## 2021-07-30 DIAGNOSIS — B86 Scabies: Secondary | ICD-10-CM

## 2021-07-30 MED ORDER — BUDESONIDE-FORMOTEROL FUMARATE 80-4.5 MCG/ACT IN AERO: 2.0000 | INHALATION_SPRAY | Freq: Two times a day (BID) | RESPIRATORY_TRACT | 3 refills | Status: DC

## 2021-07-30 MED ORDER — PREDNISONE 20 MG PO TABS: 20.0000 mg | ORAL_TABLET | Freq: Two times a day (BID) | ORAL | 0 refills | Status: AC

## 2021-07-30 MED ORDER — DEXAMETHASONE SODIUM PHOSPHATE 4 MG/ML IJ SOLN
8.0000 mg | Freq: Once | INTRAMUSCULAR | Status: AC
  Administered 2021-07-30: 8 mg via INTRAMUSCULAR

## 2021-07-30 MED ORDER — VENTOLIN HFA 108 (90 BASE) MCG/ACT IN AERS
2.0000 | INHALATION_SPRAY | Freq: Four times a day (QID) | RESPIRATORY_TRACT | 3 refills | Status: DC | PRN
Start: 2021-07-30 — End: 2021-09-24

## 2021-07-30 MED ORDER — PERMETHRIN 5 % EX CREA: 1.0000 "application " | TOPICAL_CREAM | Freq: Once | CUTANEOUS | 0 refills | Status: AC

## 2021-07-30 NOTE — Patient Instructions (Signed)
Please send a MyChart follow up message next week with update for me. F/up with Dr. Lamonte Sakai when you are able. Referral placed to social work to see if we can get some help affording a maintenance inhaler.  Decadron 8 mg IM given in office Short course of prednisone x 5 days. Then start on daily Symbicort if able to pick this up. Rescue inhaler four times daily for the next 7-10 days and then as needed. Continue daily Singulair.  Drink plenty of fluids.  Use permethrin cream as directed for scabies.   Back to ED if any severe issues with breathing again.

## 2021-07-30 NOTE — Progress Notes (Signed)
Established Patient Office Visit  Subjective:  Patient ID: Katrina Roman, female    DOB: 18-Apr-1976  Age: 45 y.o. MRN: [id number]  CC:  Chief Complaint  Patient presents with   Asthma    HPI Katrina Roman presents for hospital f/up status post asthma exacerbation. EMS was called on July 31 when her rescue inhaler was no longer helping. Placed on BiPAP in the ED. she required 5 L of oxygen and was treated with steroids, azithromycin, albuterol neb treatments.  She was discharged on 07-18-2021 in stable condition.  Dr. Lamonte Sakai had her on Prednisone 10 mg daily for a few months and then off for one week when she developed this exacerbation.Mucinex and Sudafed doesn't help. Prednisone is the only thing that seems to work  per patient.  About one week now without prednisone again and starting to feel "junky" in her lungs. She has been having hot flashes. No fever or chills.  Diagnosed with asthma in 1995.   Also complains of an itchy rash on her hands and forearms that has progressively worsened over the last month.  Says that she saw a dermatologist when it first started and she was not treated for anything. Past Medical History:  Diagnosis Date   Allergy    Anxiety    Asthma    GERD (gastroesophageal reflux disease)    Peripheral eosinophilia    PUD (peptic ulcer disease)    Seasonal allergies     Past Surgical History:  Procedure Laterality Date   ESOPHAGOGASTRODUODENOSCOPY  08/2010   noraml esophagus,3 1-mm superficial gastric ulcers seen in the antrum, No evidence H pylori   FOOT SURGERY     Right; three screws,chronic frature   NASAL SINUS SURGERY  1997 and 2009   x 2   TONSILLECTOMY      Family History  Problem Relation Age of Onset   Skin cancer Maternal Grandfather    Hypertension Father    Diabetes Father    Hypertension Mother     Social History   Socioeconomic History   Marital status: Divorced    Spouse name: Not on file   Number of children: Not on file    Years of education: Not on file   Highest education level: Not on file  Occupational History   Not on file  Tobacco Use   Smoking status: Never   Smokeless tobacco: Never  Vaping Use   Vaping Use: Never used  Substance and Sexual Activity   Alcohol use: No   Drug use: No   Sexual activity: Yes  Other Topics Concern   Not on file  Social History Narrative   Not on file   Social Determinants of Health   Financial Resource Strain: Not on file  Food Insecurity: Not on file  Transportation Needs: Not on file  Physical Activity: Not on file  Stress: Not on file  Social Connections: Not on file  Intimate Partner Violence: Not on file    Outpatient Medications Prior to Visit  Medication Sig Dispense Refill   albuterol (PROVENTIL) (2.5 MG/3ML) 0.083% nebulizer solution Take 3 mLs (2.5 mg total) by nebulization every 6 (six) hours as needed for wheezing or shortness of breath. 1080 mL 0   escitalopram (LEXAPRO) 10 MG tablet TAKE 1 TABLET BY MOUTH EVERY DAY 90 tablet 1   montelukast (SINGULAIR) 10 MG tablet TAKE 1 TABLET BY MOUTH EVERYDAY AT BEDTIME 90 tablet 3   pantoprazole (PROTONIX) 40 MG tablet Take 1 tablet (40  mg total) by mouth daily. 90 tablet 3   VITAMIN D PO Take 1,250 mcg by mouth.     VENTOLIN HFA 108 (90 Base) MCG/ACT inhaler Inhale 2 puffs into the lungs every 6 (six) hours as needed for wheezing or shortness of breath. 54 g 3   No facility-administered medications prior to visit.    Allergies  Allergen Reactions   Aspirin     REACTION: affects asthma   Cefdinir     Severe stomach cramps per patient, no nausea/vomiting   Cortisone     Swelling, flushing,itching    ROS Review of Systems +Asthma symptoms including wheezing and chest tightness, all others negative   Objective:    Physical Exam Vitals and nursing note reviewed.  Constitutional:      General: She is not in acute distress.    Appearance: Normal appearance. She is obese.  HENT:     Head:  Normocephalic.     Right Ear: External ear normal.     Left Ear: External ear normal.     Nose: Nose normal.     Mouth/Throat:     Mouth: Mucous membranes are moist.  Eyes:     Extraocular Movements: Extraocular movements intact.     Conjunctiva/sclera: Conjunctivae normal.     Pupils: Pupils are equal, round, and reactive to light.  Cardiovascular:     Rate and Rhythm: Normal rate and regular rhythm.     Pulses: Normal pulses.     Heart sounds: No murmur heard. Pulmonary:     Effort: Pulmonary effort is normal. No respiratory distress.     Breath sounds: Wheezing present.  Abdominal:     Tenderness: There is no abdominal tenderness.  Musculoskeletal:        General: Normal range of motion.     Cervical back: Normal range of motion.  Skin:    General: Skin is warm.     Comments: Diffuse discrete slightly raised erythematous rash on forearms and in webbings of fingers  Neurological:     General: No focal deficit present.     Mental Status: She is alert and oriented to person, place, and time.     Gait: Gait normal.  Psychiatric:        Mood and Affect: Mood normal.        Behavior: Behavior normal.    BP 118/83   Pulse 96   Temp 98 F (36.7 C)   Ht 5' 1"  (1.549 m)   Wt 243 lb (110.2 kg)   SpO2 94%   BMI 45.91 kg/m  Wt Readings from Last 3 Encounters:  07/30/21 243 lb (110.2 kg)  04/12/21 238 lb 9.6 oz (108.2 kg)  03/07/21 231 lb 3.2 oz (104.9 kg)     Health Maintenance Due  Topic Date Due   COVID-19 Vaccine (1) Never done   HIV Screening  Never done   Hepatitis C Screening  Never done   INFLUENZA VACCINE  07/16/2021    There are no preventive care reminders to display for this patient.  Lab Results  Component Value Date   TSH 2.31 03/07/2021   Lab Results  Component Value Date   WBC 10.5 03/07/2021   HGB 14.6 03/07/2021   HCT 44.4 03/07/2021   MCV 91.7 03/07/2021   PLT 285.0 03/07/2021   Lab Results  Component Value Date   NA 142 03/07/2021    K 3.8 03/07/2021   CO2 25 03/07/2021   GLUCOSE 94 03/07/2021   BUN  13 03/07/2021   CREATININE 0.86 03/07/2021   BILITOT 0.8 03/07/2021   ALKPHOS 61 03/07/2021   AST 12 03/07/2021   ALT 15 03/07/2021   PROT 6.3 03/07/2021   ALBUMIN 4.2 03/07/2021   CALCIUM 9.2 03/07/2021   GFR 81.86 03/07/2021   Lab Results  Component Value Date   CHOL 216 (H) 03/07/2021   Lab Results  Component Value Date   HDL 52.20 03/07/2021   Lab Results  Component Value Date   LDLCALC 127 (H) 03/07/2021   Lab Results  Component Value Date   TRIG 184.0 (H) 03/07/2021   Lab Results  Component Value Date   CHOLHDL 4 03/07/2021   No results found for: HGBA1C    Assessment & Plan:   Problem List Items Addressed This Visit       Respiratory   Asthma - Primary   Relevant Medications   VENTOLIN HFA 108 (90 Base) MCG/ACT inhaler   dexamethasone (DECADRON) injection 8 mg   budesonide-formoterol (SYMBICORT) 80-4.5 MCG/ACT inhaler   predniSONE (DELTASONE) 20 MG tablet   Other Relevant Orders   Ambulatory referral to Social Work   Other Visit Diagnoses     Scabies           Meds ordered this encounter  Medications   permethrin (ELIMITE) 5 % cream    Sig: Apply 1 application topically once for 1 dose.    Dispense:  60 g    Refill:  0   VENTOLIN HFA 108 (90 Base) MCG/ACT inhaler    Sig: Inhale 2 puffs into the lungs every 6 (six) hours as needed for wheezing or shortness of breath.    Dispense:  54 g    Refill:  3    FILL AS NAME BRAND VENTOLIN ONLY!   dexamethasone (DECADRON) injection 8 mg   budesonide-formoterol (SYMBICORT) 80-4.5 MCG/ACT inhaler    Sig: Inhale 2 puffs into the lungs 2 (two) times daily.    Dispense:  1 each    Refill:  3   predniSONE (DELTASONE) 20 MG tablet    Sig: Take 1 tablet (20 mg total) by mouth 2 (two) times daily with a meal for 5 days.    Dispense:  10 tablet    Refill:  0    Follow-up: No follow-ups on file.   1. Moderate persistent asthma  without complication Patient has had 2 hospitalizations now since October 2021.  Her pulmonologist is Dr. Lamonte Sakai.  She currently does not have insurance and is not able to follow-up with him at this time.  She is still having a flareup of her asthma.  Decadron 8 mg IM given in office today and she will follow-up with prednisone twice daily for the next 5 days starting tomorrow.  She will then try to resume on Symbicort.  Her rescue inhaler was refilled today as well.  She is going to continue Singulair daily.  She knows to go back to the emergency department for any acutely worsening symptoms of her asthma.  2. Scabies Her rash looks consistent with scabies, which also makes sense given that she was just on a long-term course of prednisone.  I am going to treat with permethrin at this time.  She knows to wash all bedding and clothing.  All family members should be treated.  She can take Benadryl as needed for itching.  She will follow-up if worse or no improvement.   Katrina Gortney M Zamani Crocker, PA-C

## 2021-08-01 ENCOUNTER — Encounter: Payer: Self-pay | Admitting: Physician Assistant

## 2021-08-02 NOTE — Telephone Encounter (Signed)
Left voice message for patient to call clinic.  

## 2021-08-02 NOTE — Telephone Encounter (Signed)
Left message on voicemail to call office. Need to clarify dose of Vit D and also it is over the counter medication.

## 2021-08-03 ENCOUNTER — Other Ambulatory Visit: Payer: Self-pay

## 2021-08-03 MED ORDER — VITAMIN D (CHOLECALCIFEROL) 25 MCG (1000 UT) PO TABS: 1000.0000 [IU] | ORAL_TABLET | Freq: Every day | ORAL | 0 refills | Status: DC

## 2021-08-03 MED ORDER — ESCITALOPRAM OXALATE 10 MG PO TABS: 10.0000 mg | ORAL_TABLET | Freq: Every day | ORAL | 1 refills | Status: DC

## 2021-08-03 MED ORDER — MONTELUKAST SODIUM 10 MG PO TABS: ORAL_TABLET | ORAL | 3 refills | Status: DC

## 2021-09-04 ENCOUNTER — Other Ambulatory Visit: Payer: Self-pay

## 2021-09-04 ENCOUNTER — Telehealth: Payer: Self-pay

## 2021-09-04 MED ORDER — VITAMIN D (CHOLECALCIFEROL) 25 MCG (1000 UT) PO TABS: 1000.0000 [IU] | ORAL_TABLET | Freq: Every day | ORAL | 1 refills | Status: DC

## 2021-09-04 NOTE — Telephone Encounter (Signed)
Rx sent in

## 2021-09-18 ENCOUNTER — Encounter: Payer: Self-pay | Admitting: Physician Assistant

## 2021-09-20 ENCOUNTER — Encounter: Payer: Self-pay | Admitting: Physician Assistant

## 2021-09-21 NOTE — Telephone Encounter (Signed)
Patient has called in regard to med refills.  [facility] [address] [facility] [place].    States she will be out of town all weekend through next week.    Please give patient a call at [phone] if you have any questions and to notify once sent.

## 2021-09-24 ENCOUNTER — Other Ambulatory Visit: Payer: Self-pay

## 2021-09-24 MED ORDER — ALBUTEROL SULFATE (2.5 MG/3ML) 0.083% IN NEBU: 2.5000 mg | INHALATION_SOLUTION | Freq: Four times a day (QID) | RESPIRATORY_TRACT | 0 refills | Status: DC | PRN

## 2021-09-24 MED ORDER — VENTOLIN HFA 108 (90 BASE) MCG/ACT IN AERS: 2.0000 | INHALATION_SPRAY | Freq: Four times a day (QID) | RESPIRATORY_TRACT | 3 refills | Status: DC | PRN

## 2021-09-24 MED ORDER — BUDESONIDE-FORMOTEROL FUMARATE 80-4.5 MCG/ACT IN AERO: 2.0000 | INHALATION_SPRAY | Freq: Two times a day (BID) | RESPIRATORY_TRACT | 3 refills | Status: DC

## 2021-09-24 NOTE — Telephone Encounter (Signed)
Patient calling back again and would like these 3 medications sent in.

## 2021-09-24 NOTE — Telephone Encounter (Signed)
Rx sent in patient notified

## 2021-09-28 ENCOUNTER — Telehealth: Payer: Self-pay

## 2021-09-28 ENCOUNTER — Other Ambulatory Visit: Payer: Self-pay | Admitting: Emergency Medicine

## 2021-09-28 NOTE — Telephone Encounter (Signed)
[facility] called in stating that [facility] our office to call to authorize the fill of ventolin.

## 2021-10-01 NOTE — Telephone Encounter (Signed)
Pharmacy will fax PA

## 2021-10-03 NOTE — Telephone Encounter (Signed)
Pt called stating that [facility] that our office can call. Pt stated that a nurse can call [phone] to give prior authorization. Please Advise.

## 2021-10-03 NOTE — Telephone Encounter (Signed)
Pt called and stated that her pharmacy has not heard from [place] regarding the refill for Ventolin. She stated that [facility] that [facility] need someone from our office to call. Please Advise.

## 2021-10-03 NOTE — Telephone Encounter (Signed)
I spoke with pharmacy they will fax over the PA information. Informed the patient,she voices understanding.

## 2021-10-04 NOTE — Telephone Encounter (Signed)
PA done via covermymeds waiting for determination

## 2021-10-16 ENCOUNTER — Other Ambulatory Visit: Payer: Self-pay

## 2021-10-16 MED ORDER — VENTOLIN HFA 108 (90 BASE) MCG/ACT IN AERS: 2.0000 | INHALATION_SPRAY | Freq: Four times a day (QID) | RESPIRATORY_TRACT | 1 refills | Status: DC | PRN

## 2021-10-18 ENCOUNTER — Telehealth: Payer: Managed Care, Other (non HMO) | Admitting: Physician Assistant

## 2021-10-18 DIAGNOSIS — M545 Low back pain, unspecified: Secondary | ICD-10-CM

## 2021-10-18 MED ORDER — CYCLOBENZAPRINE HCL 10 MG PO TABS: 5.0000 mg | ORAL_TABLET | Freq: Three times a day (TID) | ORAL | 0 refills | Status: DC | PRN

## 2021-10-18 MED ORDER — NAPROXEN 500 MG PO TABS: 500.0000 mg | ORAL_TABLET | Freq: Two times a day (BID) | ORAL | 0 refills | Status: DC

## 2021-10-18 NOTE — Progress Notes (Signed)

## 2021-10-19 ENCOUNTER — Other Ambulatory Visit: Payer: Self-pay

## 2021-10-19 ENCOUNTER — Encounter: Payer: Self-pay | Admitting: Emergency Medicine

## 2021-10-19 ENCOUNTER — Ambulatory Visit: Payer: Managed Care, Other (non HMO) | Admitting: Emergency Medicine

## 2021-10-19 VITALS — BP 118/68 | HR 101 | Temp 98.7°F | Ht 61.0 in | Wt 248.0 lb

## 2021-10-19 DIAGNOSIS — R053 Chronic cough: Secondary | ICD-10-CM | POA: Diagnosis not present

## 2021-10-19 DIAGNOSIS — K219 Gastro-esophageal reflux disease without esophagitis: Secondary | ICD-10-CM

## 2021-10-19 DIAGNOSIS — G478 Other sleep disorders: Secondary | ICD-10-CM

## 2021-10-19 DIAGNOSIS — J45909 Unspecified asthma, uncomplicated: Secondary | ICD-10-CM | POA: Diagnosis not present

## 2021-10-19 DIAGNOSIS — J310 Chronic rhinitis: Secondary | ICD-10-CM | POA: Diagnosis not present

## 2021-10-19 DIAGNOSIS — G4733 Obstructive sleep apnea (adult) (pediatric): Secondary | ICD-10-CM

## 2021-10-19 MED ORDER — ALBUTEROL SULFATE HFA 108 (90 BASE) MCG/ACT IN AERS: 2.0000 | INHALATION_SPRAY | Freq: Four times a day (QID) | RESPIRATORY_TRACT | 2 refills | Status: DC | PRN

## 2021-10-19 NOTE — Assessment & Plan Note (Signed)
Not currently treated.  She is interested in possibly getting the dental device but this has not yet been done.

## 2021-10-19 NOTE — Assessment & Plan Note (Signed)
She had a flare that required a hospital admission in August but overall is doing much better.  She is off prednisone, has good control of her rhinitis and her GERD.  We will do our best to stay off prednisone.

## 2021-10-19 NOTE — Assessment & Plan Note (Signed)
Multifactorial, improved as her GERD and rhinitis treatment has been optimized

## 2021-10-19 NOTE — Assessment & Plan Note (Signed)
She does have some underlying asthma although this can often be outweighed by her upper airway irritation syndrome.  She seems to be benefiting from the Symbicort, is tolerating it.  Her breathing is better and she would like to continue it.  I agree with this plan.  She is now off prednisone which I think is great progress.  I will check an IgE and CBC/differential to get an eosinophil count.

## 2021-10-19 NOTE — Patient Instructions (Addendum)
With continuing Symbicort 2 puffs twice a day.  Rinse and gargle after using. We will reorder your Ventolin.  Once the order is in we will work on obtaining a prior authorization for the medication. Continue Singulair 10 mg each evening Continue loratadine/cetirizine once daily Go ahead and restart chlorpheniramine 4 mg as needed since we are entering the fall allergy season. Continue the nasal saline rinses Continue your Protonix as you have been taking it. We will check your eosinophil count and IgE Follow with Dr. Lamonte Sakai in 2 months or sooner if you have any problems.

## 2021-10-19 NOTE — Assessment & Plan Note (Signed)
Continue Singulair, antihistamine (loratadine or cetirizine).  Go ahead and add back her chlorpheniramine since we are entering the fall allergy season which is her worst time of year.

## 2021-10-19 NOTE — Progress Notes (Signed)
Subjective:    Patient ID: Katrina Roman, female    DOB: July 31, 1976, 45 y.o.   MRN: [id number]  Asthma She complains of cough, shortness of breath and wheezing. Pertinent negatives include no ear pain, fever, headaches, postnasal drip, rhinorrhea, sneezing, sore throat or trouble swallowing. Her past medical history is significant for asthma.   ROV 04/12/21 --this is a follow-up visit for 45 year old woman with upper airway instability and stridor.  Also probably superimposed mild intermittent asthma, both exacerbated by chronic allergic rhinitis, nasal polyposis and GERD.  She also has OSA.  She has frequent flaring and has been to a large degree prednisone dependent.  She did not have skin testing that would support immunotherapy, most recent IgE and eosinophil counts were both normal.  She denies any breakthrough GERD.  Currently on Protonix once daily, Singulair nightly.  She is not taking fluticasone nasal spray or loratadine right now She is interested in trying to get the dental device for obstructive sleep apnea  ROV 10/19/21 --45 year old woman with a history of stridor and upper airway instability.  Probably also some superimposed mild intermittent asthma.  Both of these are exacerbated by her nasal polyposis, chronic rhinitis, GERD.  When I last saw her she was on scheduled prednisone and was doing a slow taper. Maintenance regimen includes Singulair, loratadine/Zyrtec, chlorpheniramine, Protonix. She has been on Symbicort since September.  She was admitted with a flare of her obstructive lung disease in August, off prednisone. Characterized by wheeze, SOB. She required BiPAP for a period of time. She is doing well since. On Symbicort and off pred as above. GERD seems well controlled. On her allergy regimen, [place]'s as well.  She needs a PA for ventolin - has tried and failed proair, proventil, not sure that we have failed generic albuterol HFA.   Review of Systems  Constitutional:  Negative  for fever and unexpected weight change.  HENT:  Negative for congestion, dental problem, ear pain, nosebleeds, postnasal drip, rhinorrhea, sinus pressure, sneezing, sore throat and trouble swallowing.   Eyes:  Negative for redness and itching.  Respiratory:  Positive for cough, shortness of breath and wheezing. Negative for chest tightness.   Cardiovascular:  Negative for palpitations and leg swelling.  Gastrointestinal:  Negative for nausea and vomiting.  Genitourinary:  Negative for dysuria.  Musculoskeletal:  Negative for joint swelling.  Skin:  Negative for rash.  Neurological:  Negative for headaches.  Hematological:  Does not bruise/bleed easily.  Psychiatric/Behavioral:  Negative for dysphoric mood. The patient is not nervous/anxious.       Objective:   Physical Exam Vitals:   10/19/21 1609  BP: 118/68  Pulse: (!) 101  Temp: 98.7 F (37.1 C)  TempSrc: Oral  SpO2: 97%  Weight: 248 lb (112.5 kg)  Height: 5' 1"  (1.549 m)   Gen: Pleasant, overwt woman, in no distress,  normal affect  ENT: No lesions,  mouth clear,  oropharynx clear, no postnasal drip  Neck: No JVD, no stridor today  Lungs: No use of accessory muscles, no UA noise  Cardiovascular: RRR, heart sounds normal, no murmur or gallops, no peripheral edema  Musculoskeletal: No deformities, no cyanosis or clubbing  Neuro: alert, awake, non focal  Skin: Warm, no lesions or rash       Assessment & Plan:  Cough Multifactorial, improved as her GERD and rhinitis treatment has been optimized  OSA (obstructive sleep apnea) Not currently treated.  She is interested in possibly getting the dental device but  this has not yet been done.  Intrinsic asthma She does have some underlying asthma although this can often be outweighed by her upper airway irritation syndrome.  She seems to be benefiting from the Symbicort, is tolerating it.  Her breathing is better and she would like to continue it.  I agree with this  plan.  She is now off prednisone which I think is great progress.  I will check an IgE and CBC/differential to get an eosinophil count.  Chronic rhinitis Continue Singulair, antihistamine (loratadine or cetirizine).  Go ahead and add back her chlorpheniramine since we are entering the fall allergy season which is her worst time of year.  GERD Continue current PPI  Upper airway resistance syndrome She had a flare that required a hospital admission in August but overall is doing much better.  She is off prednisone, has good control of her rhinitis and her GERD.  We will do our best to stay off prednisone.   Baltazar Apo, MD, PhD 10/19/2021, 4:34 PM [facility] [phone] or if no answer [phone]

## 2021-10-19 NOTE — Assessment & Plan Note (Signed)
Continue current PPI

## 2021-10-19 NOTE — Addendum Note (Signed)
Addended by: Fran Lowes on: 10/19/2021 04:53 PM   Modules accepted: Orders

## 2021-10-22 NOTE — Telephone Encounter (Signed)
Pharmacy could you please start PA on Ventolin?  Thank you

## 2021-10-23 ENCOUNTER — Telehealth: Payer: Self-pay | Admitting: Pharmacy Technician

## 2021-10-23 ENCOUNTER — Other Ambulatory Visit (HOSPITAL_COMMUNITY): Payer: Self-pay

## 2021-10-23 NOTE — Telephone Encounter (Signed)
Patient Advocate Encounter   Received notification from Rn in office that prior authorization for Ventolin is required by his/her insurance [employer].   PA submitted on 10/23/21 Key [id number] Status is waiting on additional questions to be prompted by [facility] will continue to follow:  Armanda Magic, CPhT Patient Advocate Phone: [phone] Fax:  [phone]

## 2021-10-24 NOTE — Telephone Encounter (Signed)
Patient Advocate Encounter  Received notification from [facility] that prior authorization for VENTOLIN is required.   PA submitted on 11.9.22 Key [id number] Status is pending   [facility] will continue to follow  Luciano Cutter, CPhT Patient Advocate [facility] Phone: [phone] Fax:  [phone]

## 2021-10-29 ENCOUNTER — Other Ambulatory Visit (HOSPITAL_COMMUNITY): Payer: Self-pay

## 2021-10-29 MED ORDER — LEVALBUTEROL TARTRATE 45 MCG/ACT IN AERO: 1.0000 | INHALATION_SPRAY | Freq: Four times a day (QID) | RESPIRATORY_TRACT | 6 refills | Status: DC | PRN

## 2021-10-29 NOTE — Telephone Encounter (Signed)
Called and spoke with patient. She [facility] [address]. RX has been sent to pharmacy.   Nothing further needed at time of call.

## 2021-10-29 NOTE — Telephone Encounter (Signed)
Called and spoke with patient. She stated that she has tried and failed the ProAir before and it did not work for her. I asked her if she had ever tried levalbuterol before and she stated that she has not.   PA Team, can we see if levalbuterol is covered by her insurance?

## 2021-10-29 NOTE — Telephone Encounter (Signed)
Ran test claim for levalbuterol. PA not needed. Co-pay is $7

## 2021-10-29 NOTE — Telephone Encounter (Signed)
Yes, ok with me to change it to levalbuterol

## 2021-10-29 NOTE — Telephone Encounter (Signed)
Received a fax regarding Prior Authorization from [employer] for [place]. Authorization has been DENIED because:  PT MUST SHOW THEY ARE UNABLE TO TAKE THE REQUIRED NUMBER OF FORMULARY ALTERNATIVES FOR THE GIVEN DIAGNOSIS DUE TO AN INTOLERANCE OR CONTRAINDICATION   OR  THE MEMBER HAS TRIED/FAILED THE REQUIRED NUMBER OF FORMULARY ALTERNATIVES. BASED ON THE POLICY AND THE INFORMATION WE RECEIVED YOUR REQUEST IS DENIED.

## 2021-10-29 NOTE — Addendum Note (Signed)
Addended by: Valerie Salts on: 10/29/2021 05:16 PM   Modules accepted: Orders

## 2021-10-29 NOTE — Telephone Encounter (Signed)
RB, can we change her prescription to levalbuterol? When I talked to her earlier, she stated that she was willing to try it if insurance covered it.

## 2021-11-05 ENCOUNTER — Encounter: Payer: Self-pay | Admitting: Emergency Medicine

## 2021-11-05 ENCOUNTER — Telehealth: Payer: Managed Care, Other (non HMO) | Admitting: Physician Assistant

## 2021-11-05 DIAGNOSIS — B9789 Other viral agents as the cause of diseases classified elsewhere: Secondary | ICD-10-CM

## 2021-11-05 DIAGNOSIS — J019 Acute sinusitis, unspecified: Secondary | ICD-10-CM

## 2021-11-05 MED ORDER — IPRATROPIUM BROMIDE 0.03 % NA SOLN: 2.0000 | Freq: Two times a day (BID) | NASAL | 0 refills | Status: DC

## 2021-11-05 NOTE — Progress Notes (Signed)
E-Visit for Sinus Problems  We are sorry that you are not feeling well.  Here is how we plan to help!  Based on what you have shared with me it looks like you have sinusitis.  Sinusitis is inflammation and infection in the sinus cavities of the head.  Based on your presentation I believe you most likely have Acute Viral Sinusitis.This is an infection most likely caused by a virus. There is not specific treatment for viral sinusitis other than to help you with the symptoms until the infection runs its course.  You may use an oral decongestant such as Mucinex D or if you have glaucoma or high blood pressure use plain Mucinex. Saline nasal spray help and can safely be used as often as needed for congestion, I have prescribed: Ipratropium Bromide nasal spray 0.03% 2 sprays in eah nostril 2-3 times a day  Some authorities believe that zinc sprays or the use of Echinacea may shorten the course of your symptoms.  Sinus infections are not as easily transmitted as other respiratory infection, however we still recommend that you avoid close contact with loved ones, especially the very young and elderly.  Remember to wash your hands thoroughly throughout the day as this is the number one way to prevent the spread of infection!  Home Care: Only take medications as instructed by your medical team. Do not take these medications with alcohol. A steam or ultrasonic humidifier can help congestion.  You can place a towel over your head and breathe in the steam from hot water coming from a faucet. Avoid close contacts especially the very young and the elderly. Cover your mouth when you cough or sneeze. Always remember to wash your hands.  Get Help Right Away If: You develop worsening fever or sinus pain. You develop a severe head ache or visual changes. Your symptoms persist after you have completed your treatment plan.  Make sure you Understand these instructions. Will watch your condition. Will get help  right away if you are not doing well or get worse.   Thank you for choosing an e-visit.  Your e-visit answers were reviewed by a board certified advanced clinical practitioner to complete your personal care plan. Depending upon the condition, your plan could have included both over the counter or prescription medications.  Please review your pharmacy choice. Make sure the pharmacy is open so you can pick up prescription now. If there is a problem, you may contact your provider through [employer] and have the prescription routed to another pharmacy.  Your safety is important to [place]. If you have drug allergies check your prescription carefully.   For the next 24 hours you can use MyChart to ask questions about today's visit, request a non-urgent call back, or ask for a work or school excuse. You will get an email in the next two days asking about your experience. I hope that your e-visit has been valuable and will speed your recovery.  I provided 5 minutes of non face-to-face time during this encounter for chart review and documentation.

## 2021-11-05 NOTE — Telephone Encounter (Signed)
RB please advise. Thanks.  

## 2021-11-06 ENCOUNTER — Ambulatory Visit (INDEPENDENT_AMBULATORY_CARE_PROVIDER_SITE_OTHER): Payer: Managed Care, Other (non HMO)

## 2021-11-06 ENCOUNTER — Other Ambulatory Visit: Payer: Self-pay

## 2021-11-06 ENCOUNTER — Ambulatory Visit (INDEPENDENT_AMBULATORY_CARE_PROVIDER_SITE_OTHER): Payer: Managed Care, Other (non HMO) | Admitting: Orthopaedic Surgery

## 2021-11-06 ENCOUNTER — Encounter: Payer: Self-pay | Admitting: Orthopaedic Surgery

## 2021-11-06 ENCOUNTER — Ambulatory Visit: Payer: Self-pay

## 2021-11-06 VITALS — BP 136/88 | HR 89 | Ht 61.0 in | Wt 240.0 lb

## 2021-11-06 DIAGNOSIS — M542 Cervicalgia: Secondary | ICD-10-CM

## 2021-11-06 DIAGNOSIS — M79641 Pain in right hand: Secondary | ICD-10-CM

## 2021-11-06 DIAGNOSIS — M79642 Pain in left hand: Secondary | ICD-10-CM

## 2021-11-06 MED ORDER — PREDNISONE 5 MG (21) PO TBPK: ORAL_TABLET | ORAL | 0 refills | Status: DC

## 2021-11-06 MED ORDER — FLUTICASONE-SALMETEROL 100-50 MCG/ACT IN AEPB: 1.0000 | INHALATION_SPRAY | Freq: Two times a day (BID) | RESPIRATORY_TRACT | 1 refills | Status: DC

## 2021-11-06 NOTE — Progress Notes (Signed)
Office Visit Note   Patient: Katrina Roman           Date of Birth: 1976/11/16           MRN: [id number] Visit Date: 11/06/2021              Requested by: Allwardt, Randa Evens, PA-C [place],  [place] [address] PCP: Fredirick Lathe, PA-C   Assessment & Plan: Visit Diagnoses:  1. Pain in right hand   2. Pain in left hand   3. Neck pain     Plan: We will place patient on prednisone Dosepak.  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  We will check her back again in 6 weeks and then decide about further diagnostic testing.  Follow-Up Instructions: Return in about 6 weeks (around 12/18/2021).   Orders:  Orders Placed This Encounter  Procedures   XR Hand Complete Right   XR Hand Complete Left   XR Cervical Spine 2 or 3 views   Meds ordered this encounter  Medications   predniSONE (STERAPRED UNI-PAK 21 TAB) 5 MG (21) TBPK tablet    Sig: Take 6,5,4,3,2,1 one tablet less each day.    Dispense:  21 tablet    Refill:  0    Not allergic to cortisone, had reaction of pain from foot injection at Pediatrist , problems walking.      Procedures: No procedures performed   Clinical Data: No additional findings.   Subjective: Chief Complaint  Patient presents with   Left Hand - New Patient (Initial Visit)   Right Hand - New Patient (Initial Visit)    HPI 45 year old female with bilateral hand numbness that wakes her up at night she is worn braces which have not helped.  Hands wake her up she has to shake them for a while and then is able to go back to sleep.  Been worse on the right and the left worse in the last month.  Anti-inflammatories have not been helpful.  Patient is allergic to aspirin Tylenol has not helped.  She is going to massage therapy treatment she does have some neck discomfort but it does not seem to be associated with the hand numbness.  She has been using the braces at  night.  Review of Systems patient does have some mild asthma positive aspirin allergies no history of nasal polyps.  Positive for GERD eosinophilia and sleep apnea.  All other systems noncontributory HPI.   Objective: Vital Signs: BP 136/88 (BP Location: Left Arm, Patient Position: Sitting, Cuff Size: Large)   Pulse 89   Ht 5' 1"  (1.549 m)   Wt 240 lb (108.9 kg)   SpO2 94%   BMI 45.35 kg/m   Physical Exam Constitutional:      Appearance: She is well-developed.  HENT:     Head: Normocephalic.     Right Ear: External ear normal.     Left Ear: External ear normal. There is no impacted cerumen.  Eyes:     Pupils: Pupils are equal, round, and reactive to light.  Neck:     Thyroid: No thyromegaly.     Trachea: No tracheal deviation.  Cardiovascular:     Rate and Rhythm: Normal rate.  Pulmonary:     Effort: Pulmonary effort is normal.  Abdominal:     Palpations: Abdomen is soft.  Musculoskeletal:     Cervical back: No rigidity.  Skin:  General: Skin is warm and dry.  Neurological:     Mental Status: She is alert and oriented to person, place, and time.  Psychiatric:        Behavior: Behavior normal.    Ortho Exam patient has brachial plexus tenderness both right and left.  Mild thenar weakness on the right without atrophy no weakness on the left.  Decreased sensation radial 3 and half fingers right and left.  Positive carpal compression test positive Phalen's.  Specialty Comments:  No specialty comments available.  Imaging: No results found.   PMFS History: Patient Active Problem List   Diagnosis Date Noted   Upper airway resistance syndrome 01/02/2021   Chronic rhinitis 10/25/2020   Nasal polyp 10/25/2020   Asthma 10/25/2020   Heartburn 10/25/2020   Drug reaction 10/25/2020   OSA (obstructive sleep apnea) 10/05/2020   Allergic rhinitis 10/04/2015   Intrinsic asthma 05/17/2014   Cough 05/17/2014   Eosinophilia 01/15/2011   PUD 01/15/2011   GERD 07/31/2010    Past Medical History:  Diagnosis Date   Allergy    Anxiety    Asthma    GERD (gastroesophageal reflux disease)    Peripheral eosinophilia    PUD (peptic ulcer disease)    Seasonal allergies     Family History  Problem Relation Age of Onset   Skin cancer Maternal Grandfather    Hypertension Father    Diabetes Father    Hypertension Mother     Past Surgical History:  Procedure Laterality Date   ESOPHAGOGASTRODUODENOSCOPY  08/2010   noraml esophagus,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

## 2021-11-06 NOTE — Telephone Encounter (Signed)
Yes please

## 2021-11-06 NOTE — Telephone Encounter (Signed)
RB pt stated that she would like to try the advair for a month to see how she does.  You want the Advair 100?  thanks

## 2021-11-09 ENCOUNTER — Other Ambulatory Visit: Payer: Self-pay | Admitting: *Deleted

## 2021-11-09 MED ORDER — PANTOPRAZOLE SODIUM 40 MG PO TBEC: 40.0000 mg | DELAYED_RELEASE_TABLET | Freq: Every day | ORAL | 3 refills | Status: DC

## 2021-11-14 NOTE — Addendum Note (Signed)
Addended by: Marybelle Killings on: 11/14/2021 08:06 AM   Modules accepted: Orders

## 2021-11-20 ENCOUNTER — Encounter: Payer: Self-pay | Admitting: Physician Assistant

## 2021-11-28 ENCOUNTER — Telehealth: Payer: Managed Care, Other (non HMO) | Admitting: Physician Assistant

## 2021-11-28 ENCOUNTER — Encounter: Payer: Self-pay | Admitting: Orthopaedic Surgery

## 2021-11-28 DIAGNOSIS — J069 Acute upper respiratory infection, unspecified: Secondary | ICD-10-CM | POA: Diagnosis not present

## 2021-11-28 MED ORDER — PREDNISONE 5 MG (21) PO TBPK: ORAL_TABLET | ORAL | 0 refills | Status: DC

## 2021-11-28 MED ORDER — BENZONATATE 100 MG PO CAPS: 100.0000 mg | ORAL_CAPSULE | Freq: Three times a day (TID) | ORAL | 0 refills | Status: DC | PRN

## 2021-11-28 NOTE — Progress Notes (Signed)
I have spent 5 minutes in review of e-visit questionnaire, review and updating patient chart, medical decision making and response to patient.   Luisalberto Beegle Cody Tammra Pressman, PA-C    

## 2021-11-28 NOTE — Progress Notes (Signed)

## 2021-11-28 NOTE — Telephone Encounter (Signed)
I have called patient and informed to disregard bill.  I have also sent email to charge correction to remove.

## 2021-12-03 ENCOUNTER — Other Ambulatory Visit: Payer: Self-pay | Admitting: Physician Assistant

## 2021-12-04 ENCOUNTER — Other Ambulatory Visit: Payer: Self-pay

## 2021-12-04 ENCOUNTER — Ambulatory Visit: Payer: Managed Care, Other (non HMO) | Admitting: Physician Assistant

## 2021-12-04 VITALS — BP 133/84 | HR 85 | Temp 97.3°F | Ht 61.0 in | Wt 244.2 lb

## 2021-12-04 DIAGNOSIS — J4531 Mild persistent asthma with (acute) exacerbation: Secondary | ICD-10-CM

## 2021-12-04 MED ORDER — AZITHROMYCIN 250 MG PO TABS: ORAL_TABLET | ORAL | 0 refills | Status: AC

## 2021-12-04 NOTE — Progress Notes (Signed)
Subjective:    Patient ID: Katrina Roman, female    DOB: 06/01/1976, 45 y.o.   MRN: [id number]  Chief Complaint  Patient presents with   chest congestion    Started 2 day    HPI  Chief complaint: Nasal congestion, chest congestion Symptom onset: All within the last week  Pertinent positives: Chest tightness, slight wheezing, some cough Pertinent negatives: Fever, chills, body aches, SOB, CP , n/v/d  Treatments tried: Symbicort daily Sick exposure: None   Hx of Asthma, last episode in August this year sent her to the hospital. She is starting to feel asthma-like symptoms coming on again.    Past Medical History:  Diagnosis Date   Allergy    Anxiety    Asthma    GERD (gastroesophageal reflux disease)    Peripheral eosinophilia    PUD (peptic ulcer disease)    Seasonal allergies     Past Surgical History:  Procedure Laterality Date   ESOPHAGOGASTRODUODENOSCOPY  08/2010   noraml esophagus,3 1-mm superficial gastric ulcers seen in the antrum, No evidence H pylori   FOOT SURGERY     Right; three screws,chronic frature   NASAL SINUS SURGERY  1997 and 2009   x 2   TONSILLECTOMY      Family History  Problem Relation Age of Onset   Skin cancer Maternal Grandfather    Hypertension Father    Diabetes Father    Hypertension Mother     Social History   Tobacco Use   Smoking status: Never   Smokeless tobacco: Never  Vaping Use   Vaping Use: Never used  Substance Use Topics   Alcohol use: No   Drug use: No     Allergies  Allergen Reactions   Aspirin     REACTION: affects asthma   Cefdinir     Severe stomach cramps per patient, no nausea/vomiting   Cortisone     Swelling, flushing,itching    Review of Systems NEGATIVE UNLESS OTHERWISE INDICATED IN HPI      Objective:     BP 133/84    Pulse 85    Temp (!) 97.3 F (36.3 C)    Ht 5' 1"  (1.549 m)    Wt 244 lb 3.2 oz (110.8 kg)    SpO2 97%    BMI 46.14 kg/m   Wt Readings from Last 3 Encounters:   12/04/21 244 lb 3.2 oz (110.8 kg)  11/06/21 240 lb (108.9 kg)  10/19/21 248 lb (112.5 kg)    BP Readings from Last 3 Encounters:  12/04/21 133/84  11/06/21 136/88  10/19/21 118/68     Physical Exam Vitals and nursing note reviewed.  Constitutional:      Appearance: Normal appearance. She is obese. She is not toxic-appearing.  HENT:     Head: Normocephalic and atraumatic.     Right Ear: Tympanic membrane, ear canal and external ear normal.     Left Ear: Tympanic membrane, ear canal and external ear normal.     Nose: Congestion present.     Mouth/Throat:     Mouth: Mucous membranes are moist.  Eyes:     Extraocular Movements: Extraocular movements intact.     Conjunctiva/sclera: Conjunctivae normal.     Pupils: Pupils are equal, round, and reactive to light.  Cardiovascular:     Rate and Rhythm: Normal rate and regular rhythm.     Pulses: Normal pulses.     Heart sounds: Normal heart sounds.  Pulmonary:  Effort: Pulmonary effort is normal.     Breath sounds: Normal breath sounds. No wheezing or rhonchi.  Musculoskeletal:        General: Normal range of motion.     Cervical back: Normal range of motion and neck supple.  Skin:    General: Skin is warm and dry.  Neurological:     General: No focal deficit present.     Mental Status: She is alert and oriented to person, place, and time.  Psychiatric:        Mood and Affect: Mood normal.        Behavior: Behavior normal.        Thought Content: Thought content normal.        Judgment: Judgment normal.       Assessment & Plan:   Problem List Items Addressed This Visit   None Visit Diagnoses     Asthmatic bronchitis with exacerbation, mild persistent    -  Primary        Meds ordered this encounter  Medications   azithromycin (ZITHROMAX) 250 MG tablet    Sig: Take 2 tablets on day 1, then 1 tablet daily on days 2 through 5    Dispense:  6 tablet    Refill:  0   1. Asthmatic bronchitis with exacerbation,  mild persistent -Similar symptoms starting up that sent her to ED in August this year -Will start on Z-pak, rescue inhaler TID to QID, fluids, nasal saline -Symbicort daily as she has been -Recheck if worse or no imp    Katrina Roman M Riva Sesma, PA-C

## 2021-12-04 NOTE — Patient Instructions (Signed)
Use rescue inhaler 3 to 4 times daily over the next week. Use your Symbicort daily. Take the Z-pak as directed. Follow up sooner if any changes.

## 2021-12-05 ENCOUNTER — Encounter: Payer: Self-pay | Admitting: Physician Assistant

## 2021-12-12 ENCOUNTER — Encounter: Payer: Self-pay | Admitting: Emergency Medicine

## 2021-12-12 ENCOUNTER — Telehealth: Payer: Self-pay | Admitting: Pharmacy Technician

## 2021-12-12 ENCOUNTER — Other Ambulatory Visit (HOSPITAL_COMMUNITY): Payer: Self-pay

## 2021-12-12 NOTE — Telephone Encounter (Signed)
Patient Advocate Encounter  Received notification from [employer] that prior authorization for VENTOLIN HFA is required.   PA submitted on 12.28.22 Key [id number] Status is pending   [facility] will continue to follow  Luciano Cutter, CPhT Patient Advocate [facility] Phone: [phone] Fax:  [phone]

## 2021-12-12 NOTE — Telephone Encounter (Signed)
PA Team, can we start a PA for her Ventolin? Per patient, she has already tried and failed Xopenex and the generic albuterol inhalers.   Thank you!

## 2021-12-12 NOTE — Telephone Encounter (Signed)
PA has been submitted.

## 2021-12-13 ENCOUNTER — Other Ambulatory Visit: Payer: Self-pay

## 2021-12-13 ENCOUNTER — Telehealth: Payer: Self-pay | Admitting: Emergency Medicine

## 2021-12-13 ENCOUNTER — Telehealth: Payer: Managed Care, Other (non HMO) | Admitting: Physician Assistant

## 2021-12-13 ENCOUNTER — Other Ambulatory Visit (HOSPITAL_COMMUNITY): Payer: Self-pay

## 2021-12-13 DIAGNOSIS — H9319 Tinnitus, unspecified ear: Secondary | ICD-10-CM

## 2021-12-13 DIAGNOSIS — J4541 Moderate persistent asthma with (acute) exacerbation: Secondary | ICD-10-CM

## 2021-12-13 NOTE — Telephone Encounter (Signed)
Referral Placed

## 2021-12-13 NOTE — Telephone Encounter (Signed)
90 day supply of Protonix [facility] to [facility]. I have spoken to [place] with [facility], who states that Rx was delivered on 11/12/2021 to [facility] or front desk.   Patient is aware of above message and voiced her understanding. She stated that she received Rx, however she received a letter from insurance that medication was not approved. She will call back if Rx gets declined when it is time for a refill.  Nothing further needed at this time.

## 2021-12-13 NOTE — Telephone Encounter (Signed)
Patient Advocate Encounter  Prior Authorization for Ventolin HFA inhaler has been approved.    PA# [id number]  Effective dates: 12/13/21 through 12/13/22  Per Test Claim Patients co-pay is $7.   Spoke with Pharmacy to Process.  Patient Advocate Fax:  [phone]

## 2021-12-13 NOTE — Progress Notes (Signed)
Based on what you shared with me, I feel your condition warrants further evaluation and I recommend that you be seen in a face to face visit.  Giving treatment for asthmatic bronchitis within the past 10 days, including a steroid taper, with recurrent/continued symptoms, you will need to be evaluated in person for full assessment and to make sure the proper ongoing treatment is given.    NOTE: There will be NO CHARGE for this eVisit   If you are having a true medical emergency please call 911.      For an urgent face to face visit, [facility] has six urgent care centers for your convenience:     [facility] Get Driving Directions [phone] [place], [place] [address]    [facility]) Get Driving Directions [phone] [place], [place] [address]  [facility] [place] [facility]) Get Driving Directions [phone] [address] [place],  [place]  [address]  [facility] Get Driving Directions [phone] [place], [place], [place] [address]   [facility] Get Driving Directions  [phone] [address] [place], [place] [address]   [facility] Get Driving Directions [phone] [address], [place],  [address]  Your MyChart E-visit questionnaire answers were reviewed by a board certified advanced clinical practitioner to complete your personal care plan based on your specific symptoms.  Thank you for using e-Visits.

## 2021-12-18 ENCOUNTER — Encounter: Payer: Self-pay | Admitting: Orthopaedic Surgery

## 2021-12-18 ENCOUNTER — Ambulatory Visit (INDEPENDENT_AMBULATORY_CARE_PROVIDER_SITE_OTHER): Payer: Managed Care, Other (non HMO) | Admitting: Orthopaedic Surgery

## 2021-12-18 ENCOUNTER — Encounter: Payer: Self-pay | Admitting: Emergency Medicine

## 2021-12-18 ENCOUNTER — Other Ambulatory Visit: Payer: Self-pay

## 2021-12-18 ENCOUNTER — Other Ambulatory Visit: Payer: Self-pay | Admitting: Emergency Medicine

## 2021-12-18 VITALS — BP 129/84 | HR 98 | Ht 61.0 in | Wt 244.0 lb

## 2021-12-18 DIAGNOSIS — G478 Other sleep disorders: Secondary | ICD-10-CM

## 2021-12-18 DIAGNOSIS — M542 Cervicalgia: Secondary | ICD-10-CM

## 2021-12-18 DIAGNOSIS — R053 Chronic cough: Secondary | ICD-10-CM

## 2021-12-18 DIAGNOSIS — M4722 Other spondylosis with radiculopathy, cervical region: Secondary | ICD-10-CM

## 2021-12-18 DIAGNOSIS — R200 Anesthesia of skin: Secondary | ICD-10-CM

## 2021-12-18 NOTE — Telephone Encounter (Signed)
Yes please order for her - flutter valve.

## 2021-12-18 NOTE — Telephone Encounter (Signed)
Order has been placed per Dr. Lamonte Sakai. Nothing further needed. Patient is aware.

## 2021-12-18 NOTE — Telephone Encounter (Signed)
Please advise on mychart.  ?

## 2021-12-19 DIAGNOSIS — R200 Anesthesia of skin: Secondary | ICD-10-CM | POA: Insufficient documentation

## 2021-12-19 DIAGNOSIS — M4722 Other spondylosis with radiculopathy, cervical region: Secondary | ICD-10-CM | POA: Insufficient documentation

## 2021-12-19 NOTE — Progress Notes (Signed)
Office Visit Note   Patient: Katrina Roman           Date of Birth: [birth year]           MRN: [id number] Visit Date: 12/18/2021              Requested by: Allwardt, Randa Evens, PA-C [place],  [place] [address] PCP: Fredirick Lathe, PA-C   Assessment & Plan: Visit Diagnoses:  1. Cervicalgia   2. Bilateral hand numbness   3. Other spondylosis with radiculopathy, cervical region     Plan: Patient has carpal tunnel symptoms as well as neck pain with plain radiographs demonstrating cervical spondylosis C6-7 with disc space narrowing spurring and straightening of the cervical spine.  We will proceed with nerve conduction velocities EMGs and also cervical MRI scan for evaluation and then return afterwards.  We discussed treatment options scenarios based on findings of the studies.  Follow-Up Instructions: No follow-ups on file.   Orders:  Orders Placed This Encounter  Procedures   MR Cervical Spine w/o contrast   Ambulatory referral to Physical Medicine Rehab   No orders of the defined types were placed in this encounter.     Procedures: No procedures performed   Clinical Data: No additional findings.   Subjective: Chief Complaint  Patient presents with   Right Hand - Numbness, Follow-up   Left Hand - Follow-up, Numbness    HPI 46 year old female returns with ongoing problems with neck pain and numbness in both hands.  She is right-hand dominant and states right and left hand bothers her both equally.  They wake her up at night she has to shake her hands.  She is worn splints at night which helps slightly.  She is on her second round of prednisone Dosepak and states this helped but when the prednisone Dosepak finishes she has had recurrence of increased neck pain and hand numbness.  No falling or balance problems.  Pain radiates down to the radial 3 fingers.  She does not have any numbness problems in the ring or small finger.  No bowel bladder  symptoms.  Review of Systems 14 point system update unchanged from 11/06/2021 office visit.   Objective: Vital Signs: BP 129/84    Pulse 98    Ht 5' 1"  (1.549 m)    Wt 244 lb (110.7 kg)    BMI 46.10 kg/m   Physical Exam Constitutional:      Appearance: She is well-developed.  HENT:     Head: Normocephalic.     Right Ear: External ear normal.     Left Ear: External ear normal. There is no impacted cerumen.  Eyes:     Pupils: Pupils are equal, round, and reactive to light.  Neck:     Thyroid: No thyromegaly.     Trachea: No tracheal deviation.  Cardiovascular:     Rate and Rhythm: Normal rate.  Pulmonary:     Effort: Pulmonary effort is normal.  Abdominal:     Palpations: Abdomen is soft.  Musculoskeletal:     Cervical back: No rigidity.  Skin:    General: Skin is warm and dry.  Neurological:     Mental Status: She is alert and oriented to person, place, and time.  Psychiatric:        Behavior: Behavior normal.    Ortho Exam patient has bilateral brachial plexus tenderness bilateral Spurling.  Upper extremity reflexes are 2+ no thenar atrophy.  Positive Phalen's test right  and left positive carpal compression test.  No thenar weakness right or left.  Lower extremity reflexes are 2+ and symmetrical normal heel toe gait.  Specialty Comments:  No specialty comments available.  Imaging: No results found.   PMFS History: Patient Active Problem List   Diagnosis Date Noted   Bilateral hand numbness 12/19/2021   Other spondylosis with radiculopathy, cervical region 12/19/2021   Upper airway resistance syndrome 01/02/2021   Chronic rhinitis 10/25/2020   Nasal polyp 10/25/2020   Asthma 10/25/2020   Heartburn 10/25/2020   Drug reaction 10/25/2020   OSA (obstructive sleep apnea) 10/05/2020   Allergic rhinitis 10/04/2015   Intrinsic asthma 05/17/2014   Cough 05/17/2014   Eosinophilia 01/15/2011   PUD 01/15/2011   GERD 07/31/2010   Past Medical History:  Diagnosis  Date   Allergy    Anxiety    Asthma    GERD (gastroesophageal reflux disease)    Peripheral eosinophilia    PUD (peptic ulcer disease)    Seasonal allergies     Family History  Problem Relation Age of Onset   Skin cancer Maternal Grandfather    Hypertension Father    Diabetes Father    Hypertension Mother     Past Surgical History:  Procedure Laterality Date   ESOPHAGOGASTRODUODENOSCOPY  08/2010   noraml esophagus,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

## 2021-12-21 ENCOUNTER — Encounter: Payer: Self-pay | Admitting: Physician Assistant

## 2021-12-25 ENCOUNTER — Encounter: Payer: Managed Care, Other (non HMO) | Admitting: Physician Assistant

## 2021-12-25 NOTE — Addendum Note (Signed)
Addended by: Dessie Coma on: 12/25/2021 11:09 AM   Modules accepted: Orders

## 2021-12-25 NOTE — Addendum Note (Signed)
Addended by: Dessie Coma on: 12/25/2021 05:18 PM   Modules accepted: Orders

## 2021-12-27 ENCOUNTER — Other Ambulatory Visit: Payer: Self-pay | Admitting: *Deleted

## 2021-12-27 ENCOUNTER — Other Ambulatory Visit: Payer: Self-pay | Admitting: Emergency Medicine

## 2021-12-27 ENCOUNTER — Other Ambulatory Visit: Payer: Self-pay | Admitting: Orthopaedic Surgery

## 2021-12-27 MED ORDER — ALBUTEROL SULFATE HFA 108 (90 BASE) MCG/ACT IN AERS: 2.0000 | INHALATION_SPRAY | Freq: Four times a day (QID) | RESPIRATORY_TRACT | 3 refills | Status: DC | PRN

## 2021-12-27 MED ORDER — TRAMADOL HCL 50 MG PO TABS: 50.0000 mg | ORAL_TABLET | Freq: Two times a day (BID) | ORAL | 0 refills | Status: DC | PRN

## 2021-12-28 NOTE — Telephone Encounter (Signed)
I spoke with patient. Insurance covered medication and she has picked it up.

## 2022-01-10 LAB — HM MAMMOGRAPHY

## 2022-01-11 ENCOUNTER — Ambulatory Visit (INDEPENDENT_AMBULATORY_CARE_PROVIDER_SITE_OTHER): Payer: Managed Care, Other (non HMO) | Admitting: Physical Medicine and Rehabilitation

## 2022-01-11 ENCOUNTER — Ambulatory Visit
Admission: RE | Admit: 2022-01-11 | Discharge: 2022-01-11 | Disposition: A | Discharge status: Home / Self Care | Payer: Managed Care, Other (non HMO) | Source: Ambulatory Visit | Attending: Orthopaedic Surgery | Admitting: Orthopaedic Surgery

## 2022-01-11 ENCOUNTER — Other Ambulatory Visit: Payer: Self-pay

## 2022-01-11 ENCOUNTER — Encounter: Payer: Self-pay | Admitting: Physical Medicine and Rehabilitation

## 2022-01-11 DIAGNOSIS — R200 Anesthesia of skin: Secondary | ICD-10-CM

## 2022-01-11 DIAGNOSIS — R202 Paresthesia of skin: Secondary | ICD-10-CM | POA: Diagnosis not present

## 2022-01-11 DIAGNOSIS — M542 Cervicalgia: Secondary | ICD-10-CM

## 2022-01-11 NOTE — Progress Notes (Signed)
Pt state numbness and pain in both hands and fingers. Pt state numbness in all finger and pain in her palm under her thumb. Pt state the pain mostly starts in the morning and she feel numbness while driving or holding something. Pt state she takes pain meds to help ease he rpain. Pt state she right handed.  Numeric Pain Rating Scale and Functional Assessment Average Pain 3   In the last MONTH (on 0-10 scale) has pain interfered with the following?  1. General activity like being  able to carry out your everyday physical activities such as walking, climbing stairs, carrying groceries, or moving a chair?  Rating(5)

## 2022-01-15 ENCOUNTER — Ambulatory Visit: Payer: Managed Care, Other (non HMO) | Admitting: Orthopaedic Surgery

## 2022-01-15 ENCOUNTER — Other Ambulatory Visit: Payer: Self-pay | Admitting: *Deleted

## 2022-01-15 ENCOUNTER — Other Ambulatory Visit: Payer: Self-pay

## 2022-01-15 ENCOUNTER — Encounter: Payer: Self-pay | Admitting: Orthopaedic Surgery

## 2022-01-15 VITALS — BP 134/88 | HR 107

## 2022-01-15 DIAGNOSIS — M4722 Other spondylosis with radiculopathy, cervical region: Secondary | ICD-10-CM

## 2022-01-15 DIAGNOSIS — R200 Anesthesia of skin: Secondary | ICD-10-CM

## 2022-01-15 MED ORDER — BUDESONIDE-FORMOTEROL FUMARATE 80-4.5 MCG/ACT IN AERO: 2.0000 | INHALATION_SPRAY | Freq: Two times a day (BID) | RESPIRATORY_TRACT | 3 refills | Status: DC

## 2022-01-15 NOTE — Procedures (Signed)
EMG & NCV Findings: Evaluation of the left median motor and the right median motor nerves showed prolonged distal onset latency (L5.1, R5.5 ms) and decreased conduction velocity (Elbow-Wrist, L49, R49 m/s).  The left median (across palm) sensory nerve showed prolonged distal peak latency (Wrist, 5.7 ms) and prolonged distal peak latency (Palm, 4.0 ms).  The right median (across palm) sensory nerve showed no response (Palm), prolonged distal peak latency (5.8 ms), and reduced amplitude (7.5 V).  All remaining nerves (as indicated in the following tables) were within normal limits.  Left vs. Right side comparison data for the ulnar motor nerve indicates abnormal L-R velocity difference (B Elbow-Wrist, 10 m/s).  All remaining left vs. right side differences were within normal limits.    Needle evaluation of the left first dorsal interosseous muscle showed increased insertional activity.  The left triceps muscle showed diminished recruitment.  The left Ext Digitorum muscle showed increased insertional activity, slightly increased spontaneous activity, and diminished recruitment.  All remaining muscles (as indicated in the following table) showed no evidence of electrical instability.    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    There is no significant electrodiagnostic evidence of any other focal nerve entrapment, brachial plexopathy or generalized peripheral neuropathy.   Recommendations: 1.  Follow-up with referring physician. 2.  Continue current management of symptoms. 3.  Continue use of resting splint at night-time and as needed during the day. 4.  Suggest cervical MRI and surgical evaluation for CTS.  ___________________________ Katrina Roman Board Certified, American  Board of Physical Medicine and Rehabilitation    Nerve Conduction Studies Anti Sensory Summary Table   Stim Site NR Peak (ms) Norm Peak (ms) P-T Amp (V) Norm P-T Amp Site1 Site2 Delta-P (ms) Dist (cm) Vel (m/s) Norm Vel (m/s)  Left Median Acr Palm Anti Sensory (2nd Digit)  32.6C  Wrist    *5.7 <3.6 18.0 >10 Wrist Palm 1.7 0.0    Palm    *4.0 <2.0 1.0         Right Median Acr Palm Anti Sensory (2nd Digit)  32.1C  Wrist    *5.8 <3.6 *7.5 >10 Wrist Palm  0.0    Palm *NR  <2.0          Left Radial Anti Sensory (Base 1st Digit)  32.2C  Wrist    1.9 <3.1 11.7  Wrist Base 1st Digit 1.9 0.0    Right Radial Anti Sensory (Base 1st Digit)  32.2C  Wrist    2.3 <3.1 14.0  Wrist Base 1st Digit 2.3 0.0    Left Ulnar Anti Sensory (5th Digit)  32.7C  Wrist    3.1 <3.7 28.5 >15.0 Wrist 5th Digit 3.1 14.0 45 >38  Right Ulnar Anti Sensory (5th Digit)  32.2C  Wrist    2.9 <3.7 33.0 >15.0 Wrist 5th Digit 2.9 14.0 48 >38   Motor Summary Table   Stim Site NR Onset (ms) Norm Onset (ms) O-P Amp (mV) Norm O-P Amp Site1 Site2 Delta-0 (ms) Dist (cm) Vel (m/s) Norm Vel (m/s)  Left Median Motor (Abd Poll Brev)  32.4C  Wrist    *5.1 <4.2 10.2 >5 Elbow Wrist 3.7 18.0 *49 >50  Elbow    8.8  9.8         Right Median Motor (  Abd Poll Brev)  32.1C  Wrist    *5.5 <4.2 5.6 >5 Elbow Wrist 3.7 18.0 *49 >50  Elbow    9.2  5.5         Left Ulnar Motor (Abd Dig Min)  32.6C  Wrist    2.9 <4.2 9.7 >3 B Elbow Wrist 2.3 18.0 78 >53  B Elbow    5.2  10.2  A Elbow B Elbow 1.1 10.0 91 >53  A Elbow    6.3  10.0         Right Ulnar Motor (Abd Dig Min)  32C  Wrist    2.7 <4.2 12.6 >3 B Elbow Wrist 2.5 17.0 68 >53  B Elbow    5.2  10.3  A Elbow B Elbow 1.1 10.0 91 >53  A Elbow    6.3  11.2          EMG   Side Muscle Nerve Root Ins Act Fibs Psw Amp Dur Poly Recrt Int Fraser Din Comment  Left Abd Poll Brev Median C8-T1 Nml Nml Nml Nml Nml 0 Nml Nml   Left 1stDorInt Ulnar C8-T1 *CRD Nml Nml Nml Nml 0 Nml Nml   Left Biceps  Musculocut C5-6 Nml Nml Nml Nml Nml 0 Nml Nml   Left Triceps Radial C6-7-8 Nml Nml Nml Nml Nml 0 *Reduced Nml   Left Ext Digitorum  Radial (Post Int) C7-8 *Incr *1+ Nml Nml Nml 0 *Reduced Nml     Nerve Conduction Studies Anti Sensory Left/Right Comparison   Stim Site L Lat (ms) R Lat (ms) L-R Lat (ms) L Amp (V) R Amp (V) L-R Amp (%) Site1 Site2 L Vel (m/s) R Vel (m/s) L-R Vel (m/s)  Median Acr Palm Anti Sensory (2nd Digit)  32.6C  Wrist *5.7 *5.8 0.1 18.0 *7.5 58.3 Wrist Palm     Palm *4.0   1.0         Radial Anti Sensory (Base 1st Digit)  32.2C  Wrist 1.9 2.3 0.4 11.7 14.0 16.4 Wrist Base 1st Digit     Ulnar Anti Sensory (5th Digit)  32.7C  Wrist 3.1 2.9 0.2 28.5 33.0 13.6 Wrist 5th Digit 45 48 3   Motor Left/Right Comparison   Stim Site L Lat (ms) R Lat (ms) L-R Lat (ms) L Amp (mV) R Amp (mV) L-R Amp (%) Site1 Site2 L Vel (m/s) R Vel (m/s) L-R Vel (m/s)  Median Motor (Abd Poll Brev)  32.4C  Wrist *5.1 *5.5 0.4 10.2 5.6 45.1 Elbow Wrist *49 *49 0  Elbow 8.8 9.2 0.4 9.8 5.5 43.9       Ulnar Motor (Abd Dig Min)  32.6C  Wrist 2.9 2.7 0.2 9.7 12.6 23.0 B Elbow Wrist 78 68 *10  B Elbow 5.2 5.2 0.0 10.2 10.3 1.0 A Elbow B Elbow 91 91 0  A Elbow 6.3 6.3 0.0 10.0 11.2 10.7          Waveforms:

## 2022-01-15 NOTE — Progress Notes (Signed)
Katrina Roman - 46 y.o. female MRN [id number]  Date of birth: [birth year]  Office Visit Note: Visit Date: 01/11/2022 PCP: Fredirick Lathe, PA-C Referred by: Marybelle Killings, MD  Subjective: Chief Complaint  Patient presents with   Right Hand - Pain, Numbness   Left Hand - Pain, Numbness   HPI:  Katrina Roman is a 46 y.o. female who comes in today at the request of Dr. Rodell Perna for electrodiagnostic study of the Bilateral upper extremities.  Patient is Right hand dominant.  She reports chronic worsening pain numbness and tingling in a nondermatomal fashion in all the digits of both hands somewhat left more than right but it did start on the right side.  She gets some complaints that are somewhat consistent with a radicular type pain from the left elbow down to the hand.  She has not had prior electrodiagnostic studies.  She does report worsening symptoms with driving and using her hands in certain positions.  She does get nocturnal complaints.  ROS Otherwise per HPI.  Assessment & Plan: Visit Diagnoses:    ICD-10-CM   1. 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    There is no significant electrodiagnostic evidence of any other focal nerve entrapment, brachial plexopathy or generalized peripheral neuropathy.   Recommendations: 1.  Follow-up with referring physician. 2.  Continue current management of symptoms. 3.  Continue use of resting splint at night-time and as needed during the day. 4.  Suggest cervical MRI and surgical evaluation for CTS.  Meds & Orders: No orders of the defined types were placed in this encounter.   Orders Placed This Encounter  Procedures   NCV with EMG  (electromyography)    Follow-up: Return in about 2 weeks (around 01/25/2022) for Rodell Perna, MD.   Procedures: No procedures performed  EMG & NCV Findings: Evaluation of the left median motor and the right median motor nerves showed prolonged distal onset latency (L5.1, R5.5 ms) and decreased conduction velocity (Elbow-Wrist, L49, R49 m/s).  The left median (across palm) sensory nerve showed prolonged distal peak latency (Wrist, 5.7 ms) and prolonged distal peak latency (Palm, 4.0 ms).  The right median (across palm) sensory nerve showed no response (Palm), prolonged distal peak latency (5.8 ms), and reduced amplitude (7.5 V).  All remaining nerves (as indicated in the following tables) were within normal limits.  Left vs. Right side comparison data for the ulnar motor nerve indicates abnormal L-R velocity difference (B Elbow-Wrist, 10 m/s).  All remaining left vs. right side differences were within normal limits.    Needle evaluation of the left first dorsal interosseous muscle showed increased insertional activity.  The left triceps muscle showed diminished recruitment.  The left Ext Digitorum muscle showed increased insertional activity, slightly increased spontaneous activity, and diminished recruitment.  All remaining muscles (as indicated in the following table) showed no evidence of electrical instability.    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    There is no significant electrodiagnostic evidence  of any other focal nerve entrapment, brachial plexopathy or generalized peripheral neuropathy.   Recommendations: 1.  Follow-up with referring physician. 2.  Continue current management of symptoms. 3.  Continue use of resting splint at night-time and as needed during the  day. 4.  Suggest cervical MRI and surgical evaluation for CTS.  ___________________________ Wonda Olds Board Certified, American Board of Physical Medicine and Rehabilitation    Nerve Conduction Studies Anti Sensory Summary Table   Stim Site NR Peak (ms) Norm Peak (ms) P-T Amp (V) Norm P-T Amp Site1 Site2 Delta-P (ms) Dist (cm) Vel (m/s) Norm Vel (m/s)  Left Median Acr Palm Anti Sensory (2nd Digit)  32.6C  Wrist    *5.7 <3.6 18.0 >10 Wrist Palm 1.7 0.0    Palm    *4.0 <2.0 1.0         Right Median Acr Palm Anti Sensory (2nd Digit)  32.1C  Wrist    *5.8 <3.6 *7.5 >10 Wrist Palm  0.0    Palm *NR  <2.0          Left Radial Anti Sensory (Base 1st Digit)  32.2C  Wrist    1.9 <3.1 11.7  Wrist Base 1st Digit 1.9 0.0    Right Radial Anti Sensory (Base 1st Digit)  32.2C  Wrist    2.3 <3.1 14.0  Wrist Base 1st Digit 2.3 0.0    Left Ulnar Anti Sensory (5th Digit)  32.7C  Wrist    3.1 <3.7 28.5 >15.0 Wrist 5th Digit 3.1 14.0 45 >38  Right Ulnar Anti Sensory (5th Digit)  32.2C  Wrist    2.9 <3.7 33.0 >15.0 Wrist 5th Digit 2.9 14.0 48 >38   Motor Summary Table   Stim Site NR Onset (ms) Norm Onset (ms) O-P Amp (mV) Norm O-P Amp Site1 Site2 Delta-0 (ms) Dist (cm) Vel (m/s) Norm Vel (m/s)  Left Median Motor (Abd Poll Brev)  32.4C  Wrist    *5.1 <4.2 10.2 >5 Elbow Wrist 3.7 18.0 *49 >50  Elbow    8.8  9.8         Right Median Motor (Abd Poll Brev)  32.1C  Wrist    *5.5 <4.2 5.6 >5 Elbow Wrist 3.7 18.0 *49 >50  Elbow    9.2  5.5         Left Ulnar Motor (Abd Dig Min)  32.6C  Wrist    2.9 <4.2 9.7 >3 B Elbow Wrist 2.3 18.0 78 >53  B Elbow    5.2  10.2  A Elbow B Elbow 1.1 10.0 91 >53  A Elbow    6.3  10.0         Right Ulnar Motor (Abd Dig Min)  32C  Wrist    2.7 <4.2 12.6 >3 B Elbow Wrist 2.5 17.0 68 >53  B Elbow    5.2  10.3  A Elbow B Elbow 1.1 10.0 91 >53  A Elbow    6.3  11.2          EMG   Side Muscle Nerve Root Ins Act Fibs Psw Amp Dur Poly Recrt Int Fraser Din Comment   Left Abd Poll Brev Median C8-T1 Nml Nml Nml Nml Nml 0 Nml Nml   Left 1stDorInt Ulnar C8-T1 *CRD Nml Nml Nml Nml 0 Nml Nml   Left Biceps Musculocut C5-6 Nml Nml Nml Nml Nml 0 Nml Nml   Left Triceps Radial C6-7-8 Nml Nml Nml Nml Nml 0 *Reduced Nml   Left Ext Digitorum  Radial (Post Int) C7-8 *Incr *1+ Nml Nml Nml 0 *Reduced Nml     Nerve Conduction Studies Anti Sensory Left/Right Comparison   Stim Site L Lat (ms) R Lat (ms) L-R Lat (ms) L Amp (V) R Amp (V) L-R Amp (%) Site1 Site2 L Vel (m/s) R Vel (m/s) L-R Vel (m/s)  Median Acr Palm Anti Sensory (2nd Digit)  32.6C  Wrist *5.7 *5.8 0.1 18.0 *7.5 58.3 Wrist Palm     Palm *4.0   1.0         Radial Anti Sensory (Base 1st Digit)  32.2C  Wrist 1.9 2.3 0.4 11.7 14.0 16.4 Wrist Base 1st Digit     Ulnar Anti Sensory (5th Digit)  32.7C  Wrist 3.1 2.9 0.2 28.5 33.0 13.6 Wrist 5th Digit 45 48 3   Motor Left/Right Comparison   Stim Site L Lat (ms) R Lat (ms) L-R Lat (ms) L Amp (mV) R Amp (mV) L-R Amp (%) Site1 Site2 L Vel (m/s) R Vel (m/s) L-R Vel (m/s)  Median Motor (Abd Poll Brev)  32.4C  Wrist *5.1 *5.5 0.4 10.2 5.6 45.1 Elbow Wrist *49 *49 0  Elbow 8.8 9.2 0.4 9.8 5.5 43.9       Ulnar Motor (Abd Dig Min)  32.6C  Wrist 2.9 2.7 0.2 9.7 12.6 23.0 B Elbow Wrist 78 68 *10  B Elbow 5.2 5.2 0.0 10.2 10.3 1.0 A Elbow B Elbow 91 91 0  A Elbow 6.3 6.3 0.0 10.0 11.2 10.7          Waveforms:                      Clinical History: No specialty comments available.     Objective:  VS:  HT:     WT:    BMI:      BP:    HR: bpm   TEMP: ( )   RESP:  Physical Exam Musculoskeletal:        General: No swelling, tenderness or deformity.     Comments: Inspection reveals no atrophy of the bilateral APB or FDI or hand intrinsics. There is no swelling, color changes, allodynia or dystrophic changes. There is 5 out of 5 strength in the bilateral wrist extension, finger abduction and long finger flexion. There is intact sensation to light touch  in all dermatomal and peripheral nerve distributions. There is a positive Phalen's test bilaterally. There is a negative Hoffmann's test bilaterally.  Skin:    General: Skin is warm and dry.     Findings: No erythema or rash.  Neurological:     General: No focal deficit present.     Mental Status: She is alert and oriented to person, place, and time.     Motor: No weakness or abnormal muscle tone.     Coordination: Coordination normal.  Psychiatric:        Mood and Affect: Mood normal.        Behavior: Behavior normal.     Imaging: No results found.

## 2022-01-16 NOTE — Progress Notes (Signed)
Office Visit Note   Patient: Katrina Roman           Date of Birth: [birth year]           MRN: [id number] Visit Date: 01/15/2022              Requested by: Allwardt, Randa Evens, PA-C [place],  [place] [id number] PCP: Fredirick Lathe, PA-C   Assessment & Plan: Visit Diagnoses:  1. Other spondylosis with radiculopathy, cervical region   2. Bilateral hand numbness     Plan: We reviewed MRI scan as well as nerve conduction velocities.  She does have spondylosis primarily at C6-7 with by foraminal stenosis.  Electrical test did not show any radiculopathy changes.  She has used splints without relief.  We will proceed with carpal tunnel release on the left hand which is more symptomatic and see how she does with this.  We discussed possible cervical spine surgery at some point in the future but at this point I would recommend outpatient carpal tunnel release.  She gets sad about the opposite right hand at some point later.  Procedure discussed with surgery discussed outlined treatment plan discussed pathophysiology reviewed wrist surgery and complications.  Patient understands request to proceed.  Follow-Up Instructions: No follow-ups on file.   Orders:  No orders of the defined types were placed in this encounter.  No orders of the defined types were placed in this encounter.     Procedures: No procedures performed   Clinical Data: No additional findings.   Subjective: Chief Complaint  Patient presents with   Neck - Follow-up    HPI patient returns to continue chronic bilateral hand numbness worse on left than right.  She is right-hand dominant has cervical spondylosis and cervical MRI scan has been obtained available for review as well as nerve conduction velocities from 01/11/2022 which shows moderate bilateral carpal tunnel syndrome median nerve compression at the wrist.  Review of Systems: Systems have been unchanged from 12/18/2021 office  visit.   Objective: Vital Signs: BP 134/88 (BP Location: Right Arm, Patient Position: Sitting, Cuff Size: Large)    Pulse (!) 107    SpO2 95%   Physical Exam Constitutional:      Appearance: She is well-developed.  HENT:     Head: Normocephalic.     Right Ear: External ear normal.     Left Ear: External ear normal. There is no impacted cerumen.  Eyes:     Pupils: Pupils are equal, round, and reactive to light.  Neck:     Thyroid: No thyromegaly.     Trachea: No tracheal deviation.  Cardiovascular:     Rate and Rhythm: Normal rate.  Pulmonary:     Effort: Pulmonary effort is normal.  Abdominal:     Palpations: Abdomen is soft.  Musculoskeletal:     Cervical back: No rigidity.  Skin:    General: Skin is warm and dry.  Neurological:     Mental Status: She is alert and oriented to person, place, and time.  Psychiatric:        Behavior: Behavior normal.    Ortho Exam positive carpal pression test right and left.  Positive Phalen's right and left.  Brachial plexus tenderness both right and left positive Spurling.  No lower extremity clonus normal heel-toe gait.  No thenar atrophy.  Specialty Comments:  QUENNA DOEPKE [id number] 46 y.o. female   Interpretation Summary  EMG & NCV Findings: Evaluation of the  left median motor and the right median motor nerves showed prolonged distal onset latency (L5.1, R5.5 ms) and decreased conduction velocity (Elbow-Wrist, L49, R49 m/s).  The left median (across palm) sensory nerve showed prolonged distal peak latency (Wrist, 5.7 ms) and prolonged distal peak latency (Palm, 4.0 ms).  The right median (across palm) sensory nerve showed no response (Palm), prolonged distal peak latency (5.8 ms), and reduced amplitude (7.5 V).  All remaining nerves (as indicated in the following tables) were within normal limits.  Left vs. Right side comparison data for the ulnar motor nerve indicates abnormal L-R velocity difference (B Elbow-Wrist, 10 m/s).  All  remaining left vs. right side differences were within normal limits.     Needle evaluation of the left first dorsal interosseous muscle showed increased insertional activity.  The left triceps muscle showed diminished recruitment.  The left Ext Digitorum muscle showed increased insertional activity, slightly increased spontaneous activity, and diminished recruitment.  All remaining muscles (as indicated in the following table) showed no evidence of electrical instability.     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     There is no significant electrodiagnostic evidence of any other focal nerve entrapment, brachial plexopathy or generalized peripheral neuropathy.    Recommendations: 1.  Follow-up with referring physician. 2.  Continue current management of symptoms. 3.  Continue use of resting splint at night-time and as needed during the day. 4.  Suggest cervical MRI and surgical evaluation for CTS.   ___________________________ Wonda Olds Board Certified, American Board of Physical Medicine and Rehabilitation  Imaging: Narrative & Impression  CLINICAL DATA:  Neck pain, chronic spondylosis C6-7 with bilateral hand numbness   EXAM: MRI CERVICAL SPINE WITHOUT CONTRAST   TECHNIQUE: Multiplanar, multisequence MR imaging of the cervical spine was performed. No intravenous contrast was administered.   COMPARISON:  X-ray 11/06/2021   FINDINGS: Alignment: Minimal grade 1 anterolisthesis C7 on T1.   Vertebrae: No fracture, evidence of discitis, or bone lesion.   Cord: Normal signal and morphology.   Posterior Fossa, vertebral arteries, paraspinal tissues: Negative.   Disc levels:   C2-C3: No significant disc protrusion, foraminal stenosis, or  canal stenosis.   C3-C4: Minimal disc bulge and mild right uncovertebral spurring. No foraminal or canal stenosis.   C4-C5: Minimal disc bulge with mild bilateral uncovertebral spurring. No foraminal or canal stenosis.   C5-C6: No significant disc protrusion, foraminal stenosis, or canal stenosis.   C6-C7: Disc height loss with minimal disc osteophyte complex and mild right greater than left uncovertebral spurring. Findings result in mild bilateral foraminal stenosis, right slightly greater than left. No significant canal stenosis.   C7-T1: Mild anterolisthesis contributes to mild left foraminal stenosis. No canal stenosis.   IMPRESSION: 1. Mild cervical spondylosis, most pronounced at C6-7 where there is mild bilateral foraminal stenosis, right slightly greater than left. 2. Mild left foraminal stenosis at C7-T1. 3. No significant canal stenosis of the cervical spine.     Electronically Signed   By: Davina Poke D.O.   On: 01/13/2022 10:17     PMFS History: Patient Active Problem List   Diagnosis Date Noted   Bilateral hand numbness 12/19/2021   Other spondylosis with radiculopathy, cervical region 12/19/2021   Upper airway resistance syndrome 01/02/2021   Chronic rhinitis 10/25/2020  Nasal polyp 10/25/2020   Asthma 10/25/2020   Heartburn 10/25/2020   Drug reaction 10/25/2020   OSA (obstructive sleep apnea) 10/05/2020   Allergic rhinitis 10/04/2015   Intrinsic asthma 05/17/2014   Cough 05/17/2014   Eosinophilia 01/15/2011   PUD 01/15/2011   GERD 07/31/2010   Past Medical History:  Diagnosis Date   Allergy    Anxiety    Asthma    GERD (gastroesophageal reflux disease)    Peripheral eosinophilia    PUD (peptic ulcer disease)    Seasonal allergies     Family History  Problem Relation Age of Onset   Skin cancer Maternal Grandfather    Hypertension Father    Diabetes Father    Hypertension Mother     Past Surgical History:  Procedure Laterality  Date   ESOPHAGOGASTRODUODENOSCOPY  08/2010   noraml esophagus,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

## 2022-01-18 ENCOUNTER — Encounter: Payer: Managed Care, Other (non HMO) | Admitting: Physician Assistant

## 2022-01-21 ENCOUNTER — Encounter: Payer: Self-pay | Admitting: Physician Assistant

## 2022-01-29 ENCOUNTER — Telehealth: Payer: Self-pay | Admitting: Orthopaedic Surgery

## 2022-01-29 ENCOUNTER — Telehealth: Payer: Managed Care, Other (non HMO) | Admitting: Physician Assistant

## 2022-01-29 DIAGNOSIS — J019 Acute sinusitis, unspecified: Secondary | ICD-10-CM | POA: Diagnosis not present

## 2022-01-29 DIAGNOSIS — B9689 Other specified bacterial agents as the cause of diseases classified elsewhere: Secondary | ICD-10-CM

## 2022-01-29 MED ORDER — DOXYCYCLINE HYCLATE 100 MG PO TABS: 100.0000 mg | ORAL_TABLET | Freq: Two times a day (BID) | ORAL | 0 refills | Status: DC

## 2022-01-29 NOTE — Progress Notes (Signed)
Virtual Visit Consent   Katrina Roman, you are scheduled for a virtual visit with a [facility] provider today.     Just as with appointments in the office, your consent must be obtained to participate.  Your consent will be active for this visit and any virtual visit you may have with one of our providers in the next 365 days.     If you have a MyChart account, a copy of this consent can be sent to you electronically.  All virtual visits are billed to your insurance company just like a traditional visit in the office.    As this is a virtual visit, video technology does not allow for your provider to perform a traditional examination.  This may limit your provider's ability to fully assess your condition.  If your provider identifies any concerns that need to be evaluated in person or the need to arrange testing (such as labs, EKG, etc.), we will make arrangements to do so.     Although advances in technology are sophisticated, we cannot ensure that it will always work on either your end or our end.  If the connection with a video visit is poor, the visit may have to be switched to a telephone visit.  With either a video or telephone visit, we are not always able to ensure that we have a secure connection.     I need to obtain your verbal consent now.   Are you willing to proceed with your visit today?    DARA BEIDLEMAN has provided verbal consent on 01/29/2022 for a virtual visit (video or telephone).   Leeanne Rio, [place]   Date: 01/29/2022 4:05 PM   Virtual Visit via Video Note   I, Leeanne Rio, connected with  NEYLAN KOROMA  ([id number], 02-Feb-1976) on 01/29/22 at  4:00 PM EST by a video-enabled telemedicine application and verified that I am speaking with the correct person using two identifiers.  Location: Patient: Virtual Visit Location Patient: Home Provider: Virtual Visit Location Provider: Home Office   I discussed the limitations of evaluation and management by  telemedicine and the availability of in person appointments. The patient expressed understanding and agreed to proceed.    History of Present Illness: Katrina Roman is a 46 y.o. who identifies as a female who was assigned female at birth, and is being seen today for possible sinusitis. Patient endorses symptoms starting a week or so ago with nasal and head congestion, worse on the L-side. Notes sinus drainage is thick and green/yellow. Denies fever, chills. Denies chest congestion or SOB/change in baseline of asthma.  Denies recent travel or sick contact. Has not taken a home COVID test.   HPI: HPI  Problems:  Patient Active Problem List   Diagnosis Date Noted   Bilateral hand numbness 12/19/2021   Other spondylosis with radiculopathy, cervical region 12/19/2021   Upper airway resistance syndrome 01/02/2021   Chronic rhinitis 10/25/2020   Nasal polyp 10/25/2020   Asthma 10/25/2020   Heartburn 10/25/2020   Drug reaction 10/25/2020   OSA (obstructive sleep apnea) 10/05/2020   Allergic rhinitis 10/04/2015   Intrinsic asthma 05/17/2014   Cough 05/17/2014   Eosinophilia 01/15/2011   PUD 01/15/2011   GERD 07/31/2010    Allergies:  Allergies  Allergen Reactions   Aspirin     REACTION: affects asthma   Cefdinir     Severe stomach cramps per patient, no nausea/vomiting   Cortisone     Swelling,  flushing,itching   Medications:  Current Outpatient Medications:    albuterol (VENTOLIN HFA) 108 (90 Base) MCG/ACT inhaler, Inhale 2 puffs into the lungs every 6 (six) hours as needed for wheezing or shortness of breath., Disp: 3 each, Rfl: 3   budesonide-formoterol (SYMBICORT) 80-4.5 MCG/ACT inhaler, Inhale 2 puffs into the lungs 2 (two) times daily., Disp: 30.6 g, Rfl: 3   doxycycline (VIBRA-TABS) 100 MG tablet, Take 1 tablet (100 mg total) by mouth 2 (two) times daily., Disp: 20 tablet, Rfl: 0   escitalopram (LEXAPRO) 10 MG tablet, Take 1 tablet (10 mg total) by mouth daily., Disp: 90 tablet,  Rfl: 1   loratadine (CLARITIN) 10 MG tablet, Take by mouth., Disp: , Rfl:    montelukast (SINGULAIR) 10 MG tablet, Take by mouth., Disp: , Rfl:    pantoprazole (PROTONIX) 40 MG tablet, Take 1 tablet by mouth daily., Disp: 90 tablet, Rfl: 2  Observations/Objective: Patient is well-developed, well-nourished in no acute distress.  Resting comfortably at home.  Head is normocephalic, atraumatic.  No labored breathing. Speech is clear and coherent with logical content.  Patient is alert and oriented at baseline.   Assessment and Plan: 1. Acute bacterial sinusitis - doxycycline (VIBRA-TABS) 100 MG tablet; Take 1 tablet (100 mg total) by mouth 2 (two) times daily.  Dispense: 20 tablet; Refill: 0  Rx Doxycycline.  Increase fluids.  Rest.  Saline nasal spray.  Probiotic.  Mucinex as directed.  Humidifier in bedroom.  Call or return to clinic if symptoms are not improving.   Follow Up Instructions: I discussed the assessment and treatment plan with the patient. The patient was provided an opportunity to ask questions and all were answered. The patient agreed with the plan and demonstrated an understanding of the instructions.  A copy of instructions were sent to the patient via MyChart unless otherwise noted below.   The patient was advised to call back or seek an in-person evaluation if the symptoms worsen or if the condition fails to improve as anticipated.  Time:  I spent 8 minutes with the patient via telehealth technology discussing the above problems/concerns.    Leeanne Rio, PA-C

## 2022-01-29 NOTE — Patient Instructions (Signed)
Katrina Roman, thank you for joining Leeanne Rio, PA-C for today's virtual visit.  While this provider is not your primary care provider (PCP), if your PCP is located in our provider database this encounter information will be shared with them immediately following your visit.  Consent: (Patient) Katrina Roman provided verbal consent for this virtual visit at the beginning of the encounter.  Current Medications:  Current Outpatient Medications:    albuterol (VENTOLIN HFA) 108 (90 Base) MCG/ACT inhaler, Inhale 2 puffs into the lungs every 6 (six) hours as needed for wheezing or shortness of breath., Disp: 3 each, Rfl: 3   budesonide-formoterol (SYMBICORT) 80-4.5 MCG/ACT inhaler, Inhale 2 puffs into the lungs 2 (two) times daily., Disp: 30.6 g, Rfl: 3   escitalopram (LEXAPRO) 10 MG tablet, Take 1 tablet (10 mg total) by mouth daily., Disp: 90 tablet, Rfl: 1   fluticasone (FLONASE) 50 MCG/ACT nasal spray, 1 spray by Both Nostrils route daily., Disp: , Rfl:    fluticasone-salmeterol (ADVAIR DISKUS) 100-50 MCG/ACT AEPB, Inhale 1 puff into the lungs 2 (two) times daily., Disp: 60 each, Rfl: 1   loratadine (CLARITIN) 10 MG tablet, Take by mouth., Disp: , Rfl:    montelukast (SINGULAIR) 10 MG tablet, TAKE 1 TABLET BY MOUTH EVERYDAY AT BEDTIME, Disp: 90 tablet, Rfl: 3   montelukast (SINGULAIR) 10 MG tablet, Take by mouth., Disp: , Rfl:    pantoprazole (PROTONIX) 40 MG tablet, Take 1 tablet by mouth daily., Disp: 90 tablet, Rfl: 2   traMADol (ULTRAM) 50 MG tablet, Take 1 tablet (50 mg total) by mouth every 12 (twelve) hours as needed., Disp: 20 tablet, Rfl: 0   Medications ordered in this encounter:  No orders of the defined types were placed in this encounter.    *If you need refills on other medications prior to your next appointment, please contact your pharmacy*  Follow-Up: Call back or seek an in-person evaluation if the symptoms worsen or if the condition fails to improve as  anticipated.  Other Instructions Please take antibiotic as directed.  Increase fluid intake.  Use Saline nasal spray.  Take a daily multivitamin. Ok to continue Mucinex.  Place a humidifier in the bedroom.  Please call or return clinic if symptoms are not improving.  Sinusitis Sinusitis is redness, soreness, and swelling (inflammation) of the paranasal sinuses. Paranasal sinuses are air pockets within the bones of your face (beneath the eyes, the middle of the forehead, or above the eyes). In healthy paranasal sinuses, mucus is able to drain out, and air is able to circulate through them by way of your nose. However, when your paranasal sinuses are inflamed, mucus and air can become trapped. This can allow bacteria and other germs to grow and cause infection. Sinusitis can develop quickly and last only a short time (acute) or continue over a long period (chronic). Sinusitis that lasts for more than 12 weeks is considered chronic.  CAUSES  Causes of sinusitis include: Allergies. Structural abnormalities, such as displacement of the cartilage that separates your nostrils (deviated septum), which can decrease the air flow through your nose and sinuses and affect sinus drainage. Functional abnormalities, such as when the small hairs (cilia) that line your sinuses and help remove mucus do not work properly or are not present. SYMPTOMS  Symptoms of acute and chronic sinusitis are the same. The primary symptoms are pain and pressure around the affected sinuses. Other symptoms include: Upper toothache. Earache. Headache. Bad breath. Decreased sense of smell and  taste. A cough, which worsens when you are lying flat. Fatigue. Fever. Thick drainage from your nose, which often is green and may contain pus (purulent). Swelling and warmth over the affected sinuses. DIAGNOSIS  Your caregiver will perform a physical exam. During the exam, your caregiver may: Look in your nose for signs of abnormal growths  in your nostrils (nasal polyps). Tap over the affected sinus to check for signs of infection. View the inside of your sinuses (endoscopy) with a special imaging device with a light attached (endoscope), which is inserted into your sinuses. If your caregiver suspects that you have chronic sinusitis, one or more of the following tests may be recommended: Allergy tests. Nasal culture A sample of mucus is taken from your nose and sent to a lab and screened for bacteria. Nasal cytology A sample of mucus is taken from your nose and examined by your caregiver to determine if your sinusitis is related to an allergy. TREATMENT  Most cases of acute sinusitis are related to a viral infection and will resolve on their own within 10 days. Sometimes medicines are prescribed to help relieve symptoms (pain medicine, decongestants, nasal steroid sprays, or saline sprays).  However, for sinusitis related to a bacterial infection, your caregiver will prescribe antibiotic medicines. These are medicines that will help Roman the bacteria causing the infection.  Rarely, sinusitis is caused by a fungal infection. In theses cases, your caregiver will prescribe antifungal medicine. For some cases of chronic sinusitis, surgery is needed. Generally, these are cases in which sinusitis recurs more than 3 times per year, despite other treatments. HOME CARE INSTRUCTIONS  Drink plenty of water. Water helps thin the mucus so your sinuses can drain more easily. Use a humidifier. Inhale steam 3 to 4 times a day (for example, sit in the bathroom with the shower running). Apply a warm, moist washcloth to your face 3 to 4 times a day, or as directed by your caregiver. Use saline nasal sprays to help moisten and clean your sinuses. Take over-the-counter or prescription medicines for pain, discomfort, or fever only as directed by your caregiver. SEEK IMMEDIATE MEDICAL CARE IF: You have increasing pain or severe headaches. You have  nausea, vomiting, or drowsiness. You have swelling around your face. You have vision problems. You have a stiff neck. You have difficulty breathing. MAKE SURE YOU:  Understand these instructions. Will watch your condition. Will get help right away if you are not doing well or get worse. Document Released: 12/02/2005 Document Revised: 02/24/2012 Document Reviewed: 12/17/2011 [facility] Patient Information 2014 [place], [place].    If you have been instructed to have an in-person evaluation today at a local Urgent Care facility, please use the link below. It will take you to a list of all of our available [facility], including address, phone number and hours of operation. Please do not delay care.  [facility]  If you or a family member do not have a primary care provider, use the link below to schedule a visit and establish care. When you choose a [facility] primary care physician or advanced practice provider, you gain a long-term partner in health. Find a Primary Care Provider  Learn more about 's in-office and virtual care options: [facility]

## 2022-01-29 NOTE — Telephone Encounter (Signed)
[place] forms received. To [employer].

## 2022-02-04 ENCOUNTER — Telehealth: Payer: Self-pay | Admitting: Orthopedic Surgery

## 2022-02-04 ENCOUNTER — Other Ambulatory Visit: Payer: Self-pay | Admitting: Orthopaedic Surgery

## 2022-02-04 MED ORDER — HYDROCODONE-ACETAMINOPHEN 5-325 MG PO TABS
1.0000 | ORAL_TABLET | Freq: Four times a day (QID) | ORAL | 0 refills | Status: DC | PRN
Start: 2022-02-04 — End: 2022-02-04

## 2022-02-04 MED ORDER — OXYCODONE-ACETAMINOPHEN 5-325 MG PO TABS
1.0000 | ORAL_TABLET | ORAL | 0 refills | Status: DC | PRN
Start: 2022-02-04 — End: 2022-04-19

## 2022-02-04 NOTE — Progress Notes (Signed)
Norco 5/325 sent in Pharmacy called , it is [facility].  Will call and change to something else

## 2022-02-04 NOTE — Telephone Encounter (Signed)
[facility]: HYDROcodone-acetaminophen (NORCO/VICODIN) 5-325 MG tablet  is [facility] and has been for about a month now.  They will not be able to fill this rx for [name].

## 2022-02-12 ENCOUNTER — Ambulatory Visit (INDEPENDENT_AMBULATORY_CARE_PROVIDER_SITE_OTHER): Payer: Managed Care, Other (non HMO) | Admitting: Orthopaedic Surgery

## 2022-02-12 ENCOUNTER — Other Ambulatory Visit: Payer: Self-pay

## 2022-02-12 ENCOUNTER — Encounter: Payer: Self-pay | Admitting: Orthopaedic Surgery

## 2022-02-12 VITALS — BP 126/80 | Ht 61.0 in | Wt 244.0 lb

## 2022-02-12 DIAGNOSIS — R200 Anesthesia of skin: Secondary | ICD-10-CM

## 2022-02-12 DIAGNOSIS — M7712 Lateral epicondylitis, left elbow: Secondary | ICD-10-CM

## 2022-02-12 NOTE — Progress Notes (Signed)
° °  Post-Op Visit Note   Patient: Katrina Roman           Date of Birth: [birth year]           MRN: [id number] Visit Date: 02/12/2022 PCP: Fredirick Lathe, PA-C   Assessment & Plan: Post left carpal tunnel release.  Hands not waking her up at night and she is very happy with this.  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  She has had problems with her opposite right arm with pain in her forearm has great difficulty and cannot reach full extension at the elbow and points to the lateral epicondyle where she is having pain.  She does not recall any activities where she did repetitive gripping squeezing etc.  Chief Complaint:  Chief Complaint  Patient presents with   Left Wrist - Routine Post Op    02/04/2022 left CTR   Visit Diagnoses:  1. Bilateral hand numbness   2. Lateral epicondylitis, left elbow     Plan: Second wrist splint applied.  She will return in 1 week for suture removal left hand.  We will also inject her right lateral epicondyle the week after that. 1+1 = 2cc.  Nurse visit sutures out on return.  Follow-Up Instructions: Return in about 1 week (around 02/19/2022).   Orders:  No orders of the defined types were placed in this encounter.  No orders of the defined types were placed in this encounter.   Imaging: No results found.  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   Cough 05/17/2014   Eosinophilia 01/15/2011   PUD 01/15/2011   GERD 07/31/2010   Past Medical  History:  Diagnosis Date   Allergy    Anxiety    Asthma    GERD (gastroesophageal reflux disease)    Peripheral eosinophilia    PUD (peptic ulcer disease)    Seasonal allergies     Family History  Problem Relation Age of Onset   Skin cancer Maternal Grandfather    Hypertension Father    Diabetes Father    Hypertension Mother     Past Surgical History:  Procedure Laterality Date   ESOPHAGOGASTRODUODENOSCOPY  08/2010   noraml esophagus,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

## 2022-02-14 ENCOUNTER — Encounter: Payer: Self-pay | Admitting: Emergency Medicine

## 2022-02-14 NOTE — Telephone Encounter (Signed)
Dr. Lamonte Sakai, please advise on pt's email. The pt is requesting a handicap placard due to her asthma and "screws in my foot". Thanks.  ?

## 2022-02-15 NOTE — Telephone Encounter (Signed)
Yes we can complete form for her ?

## 2022-02-19 ENCOUNTER — Ambulatory Visit: Payer: Managed Care, Other (non HMO)

## 2022-02-19 ENCOUNTER — Telehealth: Payer: Self-pay | Admitting: Orthopaedic Surgery

## 2022-02-19 ENCOUNTER — Other Ambulatory Visit: Payer: Self-pay

## 2022-02-19 NOTE — Telephone Encounter (Signed)
Received $25.00 check,medical records release form and disability paperwork from patient/Forwarding to [place] today ?

## 2022-02-26 ENCOUNTER — Other Ambulatory Visit: Payer: Self-pay

## 2022-02-26 ENCOUNTER — Ambulatory Visit (INDEPENDENT_AMBULATORY_CARE_PROVIDER_SITE_OTHER): Payer: Managed Care, Other (non HMO) | Admitting: Orthopaedic Surgery

## 2022-02-26 VITALS — BP 128/87 | HR 67

## 2022-02-26 DIAGNOSIS — R200 Anesthesia of skin: Secondary | ICD-10-CM

## 2022-02-26 NOTE — Progress Notes (Signed)
? ?  Post-Op Visit Note ?  ?Patient: Katrina Roman           ?Date of Birth: November 04, 1976           ?MRN: [id number] ?Visit Date: 02/26/2022 ?PCP: Allwardt, Randa Evens, PA-C ? ? ?Assessment & Plan: Postop left carpal tunnel release.  She has carpal tunnel syndrome on the opposite right hand and she will call and let us know when she wants to proceed with the right hand.  She still has some problems with lateral epicondylitis in the elbow on the right but she has been using a tennis elbow strap and feels like it is getting some better and wants to wait on the injection in her elbow.  [employer]ision looks good work slip given for work resumption 03/04/2022.  She will call when she wants to proceed with the right hand carpal tunnel release. ? ?Chief Complaint: No chief complaint on file. ? ?Visit Diagnoses:  ?1. Bilateral hand numbness   ? ? ?Plan: Postop left carpal tunnel release good relief of symptoms she will call about the right wrist when she is ready. ? ?Follow-Up Instructions: No follow-ups on file.  ? ?Orders:  ?No orders of the defined types were placed in this encounter. ? ?No orders of the defined types were placed in this encounter. ? ? ?Imaging: ?No results found. ? ?PMFS History: ?Patient Active Problem List  ? Diagnosis Date Noted  ? Lateral epicondylitis, left elbow 02/12/2022  ? Bilateral hand numbness 12/19/2021  ? Other spondylosis with radiculopathy, cervical region 12/19/2021  ? Upper airway resistance syndrome 01/02/2021  ? Chronic rhinitis 10/25/2020  ? Nasal polyp 10/25/2020  ? Asthma 10/25/2020  ? Heartburn 10/25/2020  ? Drug reaction 10/25/2020  ? OSA (obstructive sleep apnea) 10/05/2020  ? Allergic rhinitis 10/04/2015  ? 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 Cough 05/17/2014  ? Eosinophilia 01/15/2011  ? PUD 01/15/2011  ? GERD 07/31/2010  ? ?Past Medical History:  ?Diagnosis Date  ? Allergy   ? Anxiety   ? Asthma   ? GERD (gastroesophageal reflux disease)   ? Peripheral eosinophilia   ? PUD (peptic ulcer disease)    ? Seasonal allergies   ?  ?Family History  ?Problem Relation Age of Onset  ? Skin cancer Maternal Grandfather   ? Hypertension Father   ? Diabetes Father   ? Hypertension Mother   ?  ?Past Surgical History:  ?Procedure Laterality Date  ? ESOPHAGOGASTRODUODENOSCOPY  08/2010  ? noraml esophagus,3 1-mm superficial gastric ulcers seen in the antrum, No evidence H pylori  ? FOOT SURGERY    ? Right; three screws,chronic frature  ? [place] and 2009  ? x 2  ? TONSILLECTOMY    ? ?Social History  ? ?Occupational History  ? Not on file  ?Tobacco Use  ? Smoking status: Never  ? Smokeless tobacco: Never  ?Vaping Use  ? Vaping Use: Never used  ?Substance and Sexual Activity  ? Alcohol use: No  ? Drug use: No  ? Sexual activity: Yes  ? ? ? ?

## 2022-03-13 ENCOUNTER — Encounter: Payer: Self-pay | Admitting: Emergency Medicine

## 2022-03-14 MED ORDER — PREDNISONE 10 MG PO TABS: ORAL_TABLET | ORAL | 0 refills | Status: AC

## 2022-03-14 NOTE — Telephone Encounter (Signed)
I agree with treating with a prednisone taper.  She is to call us if not improving. ?Prednisone > Take 57m daily for 3 days, then 359mdaily for 3 days, then 2067maily for 3 days, then 28m55mily for 3 days, then stop ? ?

## 2022-03-14 NOTE — Telephone Encounter (Signed)
Dr. Lamonte Sakai, please review mychart messages sent by pt. She is having a flare up. ?

## 2022-03-21 ENCOUNTER — Encounter: Payer: Self-pay | Admitting: Physician Assistant

## 2022-03-21 ENCOUNTER — Ambulatory Visit (INDEPENDENT_AMBULATORY_CARE_PROVIDER_SITE_OTHER): Payer: Managed Care, Other (non HMO) | Admitting: Physician Assistant

## 2022-03-21 ENCOUNTER — Other Ambulatory Visit (HOSPITAL_COMMUNITY)
Admission: RE | Admit: 2022-03-21 | Discharge: 2022-03-21 | Disposition: A | Discharge status: Home / Self Care | Payer: Managed Care, Other (non HMO) | Source: Ambulatory Visit | Attending: Physician Assistant | Admitting: Physician Assistant

## 2022-03-21 VITALS — BP 127/88 | HR 79 | Temp 97.7°F | Ht 61.0 in | Wt 248.2 lb

## 2022-03-21 DIAGNOSIS — R739 Hyperglycemia, unspecified: Secondary | ICD-10-CM

## 2022-03-21 DIAGNOSIS — Z23 Encounter for immunization: Secondary | ICD-10-CM | POA: Diagnosis not present

## 2022-03-21 DIAGNOSIS — E782 Mixed hyperlipidemia: Secondary | ICD-10-CM | POA: Diagnosis not present

## 2022-03-21 DIAGNOSIS — Z01419 Encounter for gynecological examination (general) (routine) without abnormal findings: Secondary | ICD-10-CM

## 2022-03-21 DIAGNOSIS — L304 Erythema intertrigo: Secondary | ICD-10-CM | POA: Diagnosis not present

## 2022-03-21 LAB — HEMOGLOBIN A1C: Hgb A1c MFr Bld: 5.9 % (ref 4.6–6.5)

## 2022-03-21 LAB — LIPID PANEL
Cholesterol: 199 mg/dL (ref 0–200)
HDL: 45.3 mg/dL (ref >39.00)
LDL Cholesterol: 132 mg/dL — ABNORMAL HIGH (ref 0–99)
NonHDL: 153.54
Total CHOL/HDL Ratio: 4
Triglycerides: 109 mg/dL (ref 0.0–149.0)
VLDL: 21.8 mg/dL (ref 0.0–40.0)

## 2022-03-21 LAB — CBC WITH DIFFERENTIAL/PLATELET
Basophils Absolute: 0.1 10*3/uL (ref 0.0–0.1)
Basophils Relative: 1 % (ref 0.0–3.0)
Eosinophils Absolute: 0.4 10*3/uL (ref 0.0–0.7)
Eosinophils Relative: 6 % — ABNORMAL HIGH (ref 0.0–5.0)
HCT: 40.7 % (ref 36.0–46.0)
Hemoglobin: 13.5 g/dL (ref 12.0–15.0)
Lymphocytes Relative: 29.4 % (ref 12.0–46.0)
Lymphs Abs: 2 10*3/uL (ref 0.7–4.0)
MCHC: 33.3 g/dL (ref 30.0–36.0)
MCV: 90.5 fl (ref 78.0–100.0)
Monocytes Absolute: 0.7 10*3/uL (ref 0.1–1.0)
Monocytes Relative: 10 % (ref 3.0–12.0)
Neutro Abs: 3.6 10*3/uL (ref 1.4–7.7)
Neutrophils Relative %: 53.6 % (ref 43.0–77.0)
Platelets: 257 10*3/uL (ref 150.0–400.0)
RBC: 4.49 Mil/uL (ref 3.87–5.11)
RDW: 14.7 % (ref 11.5–15.5)
WBC: 6.7 10*3/uL (ref 4.0–10.5)

## 2022-03-21 LAB — COMPREHENSIVE METABOLIC PANEL
ALT: 21 U/L (ref 0–35)
AST: 20 U/L (ref 0–37)
Albumin: 4.2 g/dL (ref 3.5–5.2)
Alkaline Phosphatase: 74 U/L (ref 39–117)
BUN: 13 mg/dL (ref 6–23)
CO2: 22 mEq/L (ref 19–32)
Calcium: 9 mg/dL (ref 8.4–10.5)
Chloride: 108 mEq/L (ref 96–112)
Creatinine, Ser: 0.75 mg/dL (ref 0.40–1.20)
GFR: 95.76 mL/min (ref >60.00)
Glucose, Bld: 95 mg/dL (ref 70–99)
Potassium: 3.8 mEq/L (ref 3.5–5.1)
Sodium: 139 mEq/L (ref 135–145)
Total Bilirubin: 0.5 mg/dL (ref 0.2–1.2)
Total Protein: 6.7 g/dL (ref 6.0–8.3)

## 2022-03-21 MED ORDER — NYSTATIN 100000 UNIT/GM EX POWD: 1.0000 "application " | Freq: Three times a day (TID) | CUTANEOUS | 1 refills | Status: DC

## 2022-03-21 NOTE — Patient Instructions (Signed)
Good to see you! ?Labs & pap today  ?Tdap updated ? ?Keep working on your health goals! Proud of you!  ? ?Happy birthday and happy Ivor Costa!  ?

## 2022-03-21 NOTE — Progress Notes (Signed)
? ?Subjective:  ? ? Patient ID: Katrina Roman, female    DOB: 12/31/1975, 46 y.o.   MRN: [id number] ? ?Chief Complaint  ?Patient presents with  ? Annual Exam  ? ? ?HPI ?Patient is in today for annual exam. ? ?Acute concerns: ?None ? ?Health maintenance: ?Lifestyle/ exercise: Working on making changes ?Nutrition: BF just diagnosed with diabetes, both looking to make changes  ?Mental health: Lexapro 10 mg daily working well ?Caffeine: Soda pop ?Sleep: Ok ?Substance use: None ?ETOH: None ?Sexual activity: Monogamous ?IUD good until next year  ?Immunizations: Update tetanus today ?Colonoscopy: 04/02/21 - ?Pap: Updating today  ?Mammogram: UTD, normal - repeats annually ? ? ?Past Medical History:  ?Diagnosis Date  ? Allergy   ? Anxiety   ? Asthma   ? GERD (gastroesophageal reflux disease)   ? Peripheral eosinophilia   ? PUD (peptic ulcer disease)   ? Seasonal allergies   ? ? ?Past Surgical History:  ?Procedure Laterality Date  ? ESOPHAGOGASTRODUODENOSCOPY  08/2010  ? noraml esophagus,3 1-mm superficial gastric ulcers seen in the antrum, No evidence H pylori  ? FOOT SURGERY    ? Right; three screws,chronic frature  ? NASAL SINUS SURGERY  1997 and 2009  ? x 2  ? TONSILLECTOMY    ? ? ?Family History  ?Problem Relation Age of Onset  ? Skin cancer Maternal Grandfather   ? Hypertension Father   ? Diabetes Father   ? Hypertension Mother   ? ? ?Social History  ? ?Tobacco Use  ? Smoking status: Never  ? Smokeless tobacco: Never  ?Vaping Use  ? Vaping Use: Never used  ?Substance Use Topics  ? Alcohol use: No  ? Drug use: No  ?  ? ?Allergies  ?Allergen Reactions  ? Aspirin   ?  REACTION: affects asthma  ? Cefdinir   ?  Severe stomach cramps per patient, no nausea/vomiting  ? Cortisone   ?  Swelling, flushing,itching  ? ? ?Review of Systems ?NEGATIVE UNLESS OTHERWISE INDICATED IN HPI ? ? ?   ?Objective:  ?  ? ?BP 127/88   Pulse 79   Temp 97.7 ?F (36.5 ?C)   Ht 5' 1"  (1.549 m)   Wt 248 lb 3.2 oz (112.6 kg)   SpO2 98%   BMI 46.90  kg/m?  ? ?Wt Readings from Last 3 Encounters:  ?03/21/22 248 lb 3.2 oz (112.6 kg)  ?02/12/22 244 lb (110.7 kg)  ?12/18/21 244 lb (110.7 kg)  ? ? ?BP Readings from Last 3 Encounters:  ?03/21/22 127/88  ?02/26/22 128/87  ?02/12/22 126/80  ?  ? ?Physical Exam ?Vitals and nursing note reviewed.  ?Constitutional:   ?   Appearance: Normal appearance. She is obese. She is not toxic-appearing.  ?HENT:  ?   Head: Normocephalic and atraumatic.  ?   Right Ear: Tympanic membrane, ear canal and external ear normal.  ?   Left Ear: Tympanic membrane, ear canal and external ear normal.  ?   Nose: Nose normal.  ?   Mouth/Throat:  ?   Mouth: Mucous membranes are moist.  ?Eyes:  ?   Extraocular Movements: Extraocular movements intact.  ?   Conjunctiva/sclera: Conjunctivae normal.  ?   Pupils: Pupils are equal, round, and reactive to light.  ?Cardiovascular:  ?   Rate and Rhythm: Normal rate and regular rhythm.  ?   Pulses: Normal pulses.  ?   Heart sounds: Normal heart sounds.  ?Pulmonary:  ?   Effort: Pulmonary effort is normal.  ?  Breath sounds: Normal breath sounds.  ?Abdominal:  ?   General: Abdomen is flat. Bowel sounds are normal.  ?   Palpations: Abdomen is soft.  ?Musculoskeletal:     ?   General: Normal range of motion.  ?   Cervical back: Normal range of motion and neck supple.  ?Skin: ?   General: Skin is warm and dry.  ?   Comments: Intertrigo left breast developing ?  ?Neurological:  ?   General: No focal deficit present.  ?   Mental Status: She is alert and oriented to person, place, and time.  ?Psychiatric:     ?   Mood and Affect: Mood normal.     ?   Behavior: Behavior normal.     ?   Thought Content: Thought content normal.     ?   Judgment: Judgment normal.  ? ? ?   ?Assessment & Plan:  ? ?Problem List Items Addressed This Visit   ?None ?Visit Diagnoses   ? ? Well woman exam with routine gynecological exam    -  Primary  ? Relevant Orders  ? CBC with Differential/Platelet  ? Comprehensive metabolic panel  ? Lipid  panel  ? Hemoglobin A1c  ? Cytology - PAP  ? Mixed hyperlipidemia      ? Relevant Orders  ? Hemoglobin A1c  ? Hyperglycemia      ? Relevant Orders  ? Comprehensive metabolic panel  ? Hemoglobin A1c  ? Need for Tdap vaccination      ? Relevant Orders  ? Tdap vaccine greater than or equal to 7yo IM (Completed)  ? Intertrigo      ? ?  ? ? ? ?Meds ordered this encounter  ?Medications  ? nystatin (MYCOSTATIN/NYSTOP) powder  ?  Sig: Apply 1 application. topically 3 (three) times daily.  ?  Dispense:  15 g  ?  Refill:  1  ? ?PLAN ?-Age-appropriate screening and counseling performed today. Will check labs and call with results. Preventive measures discussed and printed in AVS for patient.  ?-Tdap updated ?-Cologuard and mammo are UTD ?-Pap in office today ?-Encouraged lifestyle changes such as cutting out sodas, adding weights, walking daily, more veggies ?-Nystop powder for intertrigo ? ?Recheck 1 year or prn ? ? ?This note was prepared with assistance of Systems analyst. Occasional wrong-word or sound-a-like substitutions may have occurred due to the inherent limitations of voice recognition software. ? ? ? ?Rashi Granier M Fleurette Woolbright, PA-C ?

## 2022-03-25 LAB — CYTOLOGY - PAP
Comment: NEGATIVE
Diagnosis: NEGATIVE
High risk HPV: NEGATIVE

## 2022-03-26 ENCOUNTER — Encounter: Payer: Self-pay | Admitting: Emergency Medicine

## 2022-03-26 NOTE — Telephone Encounter (Signed)
Dr. Lamonte Sakai, pt wanted you to be aware of her recent eosinophil (relative) result. CBC with diff was collected on 03/21/22 by her PCP. Thanks.  ?

## 2022-04-19 ENCOUNTER — Telehealth: Payer: Managed Care, Other (non HMO) | Admitting: Physician Assistant

## 2022-04-19 DIAGNOSIS — J069 Acute upper respiratory infection, unspecified: Secondary | ICD-10-CM | POA: Diagnosis not present

## 2022-04-19 MED ORDER — IPRATROPIUM BROMIDE 0.03 % NA SOLN: 2.0000 | Freq: Two times a day (BID) | NASAL | 0 refills | Status: DC

## 2022-04-19 MED ORDER — BENZONATATE 100 MG PO CAPS: 100.0000 mg | ORAL_CAPSULE | Freq: Three times a day (TID) | ORAL | 0 refills | Status: DC | PRN

## 2022-04-19 NOTE — Progress Notes (Signed)

## 2022-04-23 ENCOUNTER — Telehealth: Payer: Managed Care, Other (non HMO) | Admitting: Physician Assistant

## 2022-04-23 DIAGNOSIS — B9689 Other specified bacterial agents as the cause of diseases classified elsewhere: Secondary | ICD-10-CM

## 2022-04-23 MED ORDER — DOXYCYCLINE HYCLATE 100 MG PO TABS: 100.0000 mg | ORAL_TABLET | Freq: Two times a day (BID) | ORAL | 0 refills | Status: DC

## 2022-04-23 NOTE — Progress Notes (Signed)
E-Visit for Sinus Problems ? ?We are sorry that you are not feeling well.  Here is how we plan to help! ? ?Based on what you have shared with me it looks like you have sinusitis.  Sinusitis is inflammation and infection in the sinus cavities of the head.  Based on your presentation I believe you most likely have Acute Bacterial Sinusitis.  This is an infection caused by bacteria and is treated with antibiotics. I have prescribed Doxycycline 129m by mouth twice a day for 10 days. You may use an oral decongestant such as Mucinex D or if you have glaucoma or high blood pressure use plain Mucinex. Saline nasal spray help and can safely be used as often as needed for congestion.  If you develop worsening sinus pain, fever or notice severe headache and vision changes, or if symptoms are not better after completion of antibiotic, please schedule an appointment with a health care provider.   ? ?Sinus infections are not as easily transmitted as other respiratory infection, however we still recommend that you avoid close contact with loved ones, especially the very young and elderly.  Remember to wash your hands thoroughly throughout the day as this is the number one way to prevent the spread of infection! ? ?Home Care: ?Only take medications as instructed by your medical team. ?Complete the entire course of an antibiotic. ?Do not take these medications with alcohol. ?A steam or ultrasonic humidifier can help congestion.  You can place a towel over your head and breathe in the steam from hot water coming from a faucet. ?Avoid close contacts especially the very young and the elderly. ?Cover your mouth when you cough or sneeze. ?Always remember to wash your hands. ? ?Get Help Right Away If: ?You develop worsening fever or sinus pain. ?You develop a severe head ache or visual changes. ?Your symptoms persist after you have completed your treatment plan. ? ?Make sure you ?Understand these instructions. ?Will watch your  condition. ?Will get help right away if you are not doing well or get worse. ? ?Thank you for choosing an e-visit. ? ?Your e-visit answers were reviewed by a board certified advanced clinical practitioner to complete your personal care plan. Depending upon the condition, your plan could have included both over the counter or prescription medications. ? ?Please review your pharmacy choice. Make sure the pharmacy is open so you can pick up prescription now. If there is a problem, you may contact your provider through [employer] have the prescription routed to another pharmacy.  Your safety is important to [place] If you have drug allergies check your prescription carefully.  ? ?For the next 24 hours you can use MyChart to ask questions about today's visit, request a non-urgent call back, or ask for a work or school excuse. ?You will get an email in the next two days asking about your experience. I hope that your e-visit has been valuable and will speed your recovery. ? ?

## 2022-04-23 NOTE — Progress Notes (Signed)
I have spent 5 minutes in review of e-visit questionnaire, review and updating patient chart, medical decision making and response to patient.   Kiriana Worthington Cody Doneisha Ivey, PA-C    

## 2022-04-25 ENCOUNTER — Telehealth: Payer: Managed Care, Other (non HMO) | Admitting: Physician Assistant

## 2022-04-25 DIAGNOSIS — H1031 Unspecified acute conjunctivitis, right eye: Secondary | ICD-10-CM

## 2022-04-25 MED ORDER — POLYMYXIN B-TRIMETHOPRIM 10000-0.1 UNIT/ML-% OP SOLN: OPHTHALMIC | 0 refills | Status: DC

## 2022-04-25 NOTE — Progress Notes (Signed)
I have spent 5 minutes in review of e-visit questionnaire, review and updating patient chart, medical decision making and response to patient.   Magnus Crescenzo Cody Mariella Blackwelder, PA-C    

## 2022-04-25 NOTE — Progress Notes (Signed)
E-Visit for Pink Eye ? ? ?We are sorry that you are not feeling well.  Here is how we plan to help! ? ?Based on what you have shared with me it looks like you have conjunctivitis.  Conjunctivitis is a common inflammatory or infectious condition of the eye that is often referred to as "pink eye".  In most cases it is contagious (viral or bacterial). However, not all conjunctivitis requires antibiotics (ex. Allergic).  We have made appropriate suggestions for you based upon your presentation. ? ?I have prescribed Polytrim Ophthalmic drops 1-2 drops 4 times a day times 5 days ? ?Pink eye can be highly contagious.  It is typically spread through direct contact with secretions, or contaminated objects or surfaces that one may have touched.  Strict handwashing is suggested with soap and water is urged.  If not available, use alcohol based had sanitizer.  Avoid unnecessary touching of the eye.  If you wear contact lenses, you will need to refrain from wearing them until you see no white discharge from the eye for at least 24 hours after being on medication.  You should see symptom improvement in 1-2 days after starting the medication regimen.  Call us if symptoms are not improved in 1-2 days. ? ?Home Care: ?Wash your hands often! ?Do not wear your contacts until you complete your treatment plan. ?Avoid sharing towels, bed linen, personal items with a person who has pink eye. ?See attention for anyone in your home with similar symptoms. ? ?Get Help Right Away If: ?Your symptoms do not improve. ?You develop blurred or loss of vision. ?Your symptoms worsen (increased discharge, pain or redness) ? ? ?Thank you for choosing an e-visit. ? ?Your e-visit answers were reviewed by a board certified advanced clinical practitioner to complete your personal care plan. Depending upon the condition, your plan could have included both over the counter or prescription medications. ? ?Please review your pharmacy choice. Make sure the  pharmacy is open so you can pick up prescription now. If there is a problem, you may contact your provider through [employer] and have the prescription routed to another pharmacy.  Your safety is important to [place]. If you have drug allergies check your prescription carefully.  ? ?For the next 24 hours you can use MyChart to ask questions about today's visit, request a non-urgent call back, or ask for a work or school excuse. ?You will get an email in the next two days asking about your experience. I hope that your e-visit has been valuable and will speed your recovery. ? ?

## 2022-05-02 ENCOUNTER — Encounter: Payer: Self-pay | Admitting: Physician Assistant

## 2022-05-02 NOTE — Telephone Encounter (Signed)
Form has been faxed, confirmation received.

## 2022-05-07 ENCOUNTER — Encounter: Payer: Self-pay | Admitting: Emergency Medicine

## 2022-05-07 ENCOUNTER — Ambulatory Visit (INDEPENDENT_AMBULATORY_CARE_PROVIDER_SITE_OTHER): Payer: Managed Care, Other (non HMO) | Admitting: Emergency Medicine

## 2022-05-07 DIAGNOSIS — G478 Other sleep disorders: Secondary | ICD-10-CM | POA: Diagnosis not present

## 2022-05-07 DIAGNOSIS — J45909 Unspecified asthma, uncomplicated: Secondary | ICD-10-CM | POA: Diagnosis not present

## 2022-05-07 DIAGNOSIS — K219 Gastro-esophageal reflux disease without esophagitis: Secondary | ICD-10-CM

## 2022-05-07 DIAGNOSIS — G4733 Obstructive sleep apnea (adult) (pediatric): Secondary | ICD-10-CM | POA: Diagnosis not present

## 2022-05-07 DIAGNOSIS — J310 Chronic rhinitis: Secondary | ICD-10-CM

## 2022-05-07 NOTE — Patient Instructions (Signed)
Please continue Symbicort 2 puffs twice a day.  Rinse and gargle after using. Continue your pantoprazole twice a day Continue loratadine 10 mg once daily. Continue Atrovent nasal spray, 2 sprays each nostril up to every 6 hours if needed for congestion, drainage We will plan to follow-up in 1 year.  Please call sooner if you are having any flaring symptoms so we can see you then

## 2022-05-07 NOTE — Assessment & Plan Note (Signed)
On loratadine, Atrovent nasal spray.  Plan to continue same.  We did discuss possibly scaling back in the off season

## 2022-05-07 NOTE — Assessment & Plan Note (Signed)
Mild.  Has not required CPAP

## 2022-05-07 NOTE — Progress Notes (Signed)
   Subjective:    Patient ID: Katrina Roman, female    DOB: December 15, 1976, 46 y.o.   MRN: [id number]  Asthma She complains of cough, shortness of breath and wheezing. Pertinent negatives include no ear pain, fever, headaches, postnasal drip, rhinorrhea, sneezing, sore throat or trouble swallowing. Her past medical history is significant for asthma.   ROV 10/19/21 --46 year old woman with a history of stridor and upper airway instability.  Probably also some superimposed mild intermittent asthma.  Both of these are exacerbated by her nasal polyposis, chronic rhinitis, GERD.  When I last saw her she was on scheduled prednisone and was doing a slow taper. Maintenance regimen includes Singulair, loratadine/Zyrtec, chlorpheniramine, Protonix. She has been on Symbicort since September.  She was admitted with a flare of her obstructive lung disease in August, off prednisone. Characterized by wheeze, SOB. She required BiPAP for a period of time. She is doing well since. On Symbicort and off pred as above. GERD seems well controlled. On her allergy regimen, NSW's as well.  She needs a PA for ventolin - has tried and failed proair, proventil, not sure that we have failed generic albuterol HFA.    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  She has chronic rhinitis, nasal polyposis, GERD.  She has had lab work that showed a relative elevated eosinophil count with a normal absolute (6%, 0.4) on 03/21/2022.  She is on symbicort, does benefit from it. Rare albuterol use. No flares since last time. She is on PPI bid, loratadine, atrovent NS.       Objective:   Physical Exam Vitals:   05/07/22 1634  BP: 124/80  Pulse: 89  Temp: 98.4 F (36.9 C)  TempSrc: Oral  SpO2: 96%  Weight: 250 lb 12.8 oz (113.8 kg)  Height: 5' 1"  (1.549 m)   Gen: Pleasant, overwt woman, in no distress,  normal  affect  ENT: No lesions,  mouth clear,  oropharynx clear, no postnasal drip  Neck: No JVD, no stridor today  Lungs: No use of accessory muscles, no UA noise  Cardiovascular: RRR, heart sounds normal, no murmur or gallops, no peripheral edema  Musculoskeletal: No deformities, no cyanosis or clubbing  Neuro: alert, awake, non focal  Skin: Warm, no lesions or rash       Assessment & Plan:  OSA (obstructive sleep apnea) Mild.  Has not required CPAP  Upper airway resistance syndrome Stable at this time.  She has done a good job controlling exacerbating factors including her chronic rhinitis, GERD.  Intrinsic asthma With mild elevated relative eosinophil count 03/21/22.  Her allergy testing has been negative for any targets for immunotherapy.  She is doing quite well since last visit, probably because we have better control over her GERD since her PPI was changed to twice daily.  Plan to continue her Symbicort, albuterol available.  Chronic rhinitis On loratadine, Atrovent nasal spray.  Plan to continue same.  We did discuss possibly scaling back in the off season  GERD Benefiting from pantoprazole twice daily.  Discussed with her the potential long-term side effects from PPI.  We may decide to decrease to daily in the nonallergy months.  May need to discuss alternatives going forward.   Baltazar Apo, MD, PhD 05/07/2022, 5:04 PM [facility] [phone] or if no answer [phone]

## 2022-05-07 NOTE — Assessment & Plan Note (Signed)
With mild elevated relative eosinophil count 03/21/22.  Her allergy testing has been negative for any targets for immunotherapy.  She is doing quite well since last visit, probably because we have better control over her GERD since her PPI was changed to twice daily.  Plan to continue her Symbicort, albuterol available.

## 2022-05-07 NOTE — Assessment & Plan Note (Signed)
Stable at this time.  She has done a good job controlling exacerbating factors including her chronic rhinitis, GERD.

## 2022-05-07 NOTE — Assessment & Plan Note (Signed)
Benefiting from pantoprazole twice daily.  Discussed with her the potential long-term side effects from PPI.  We may decide to decrease to daily in the nonallergy months.  May need to discuss alternatives going forward.

## 2022-05-16 ENCOUNTER — Other Ambulatory Visit: Payer: Self-pay

## 2022-05-16 ENCOUNTER — Encounter: Payer: Self-pay | Admitting: Physician Assistant

## 2022-05-16 MED ORDER — ESCITALOPRAM OXALATE 10 MG PO TABS: 10.0000 mg | ORAL_TABLET | Freq: Every day | ORAL | 1 refills | Status: DC

## 2022-05-16 NOTE — Telephone Encounter (Signed)
Rx sent to pharmacy pt requested

## 2022-05-20 ENCOUNTER — Encounter: Payer: Self-pay | Admitting: Physician Assistant

## 2022-05-21 ENCOUNTER — Telehealth (INDEPENDENT_AMBULATORY_CARE_PROVIDER_SITE_OTHER): Payer: Managed Care, Other (non HMO) | Admitting: Physician Assistant

## 2022-05-21 ENCOUNTER — Encounter: Payer: Self-pay | Admitting: Physician Assistant

## 2022-05-21 VITALS — Temp 97.3°F | Ht 61.0 in | Wt 250.0 lb

## 2022-05-21 DIAGNOSIS — R051 Acute cough: Secondary | ICD-10-CM | POA: Diagnosis not present

## 2022-05-21 DIAGNOSIS — J0101 Acute recurrent maxillary sinusitis: Secondary | ICD-10-CM | POA: Diagnosis not present

## 2022-05-21 MED ORDER — AZITHROMYCIN 250 MG PO TABS: ORAL_TABLET | ORAL | 0 refills | Status: AC

## 2022-05-21 MED ORDER — PREDNISONE 20 MG PO TABS: 20.0000 mg | ORAL_TABLET | Freq: Two times a day (BID) | ORAL | 0 refills | Status: AC

## 2022-05-21 NOTE — Progress Notes (Signed)
   Virtual Visit via Video Note  I connected with  Katrina Roman  on 05/21/22 at 11:30 AM EDT by a video enabled telemedicine application and verified that I am speaking with the correct person using two identifiers.  Location: Patient: home Provider: Therapist, music at [place] present: Patient and myself   I discussed the limitations of evaluation and management by telemedicine and the availability of in person appointments. The patient expressed understanding and agreed to proceed.   History of Present Illness:  Chief complaint: Sinus congestion Symptom onset: 4 days ago Pertinent positives: hot flashes, scratchy throat, sinus pain, tight / wheezy cough, occ dizzy feeling Pertinent negatives: fever, n/v/d/ abd pain, rash  Treatments tried: allergy medication, atrovent nasal spray Sick exposure: No known    Observations/Objective:   Gen: Awake, alert, no acute distress, very congested sounding ENT: reports TTP over maxillary sinuses Resp: Breathing is even and non-labored; active dry cough  Psych: calm/pleasant demeanor Neuro: Alert and Oriented x 3, + facial symmetry, speech is clear.   Assessment and Plan:  1. Acute recurrent maxillary sinusitis 2. Acute cough Patient with a history of recurrent sinusitis and asthma exacerbations, presenting similar to previous incidences.  She has been hospitalized for similar situations previously, therefore usually treatment early on is best for her.  Plan to start on Z-Pak and prednisone as directed.  She will continue her regular allergy and asthma regimen at home.  ER precautions advised.  Patient understanding and agreeable.  Recheck in office as needed.   Follow Up Instructions:    I discussed the assessment and treatment plan with the patient. The patient was provided an opportunity to ask questions and all were answered. The patient agreed with the plan and demonstrated an understanding of the  instructions.   The patient was advised to call back or seek an in-person evaluation if the symptoms worsen or if the condition fails to improve as anticipated.  Dannie Hattabaugh M Chastelyn [place], PA-C

## 2022-06-11 ENCOUNTER — Encounter: Payer: Self-pay | Admitting: Physician Assistant

## 2022-06-12 ENCOUNTER — Other Ambulatory Visit: Payer: Self-pay | Admitting: Physician Assistant

## 2022-06-12 MED ORDER — PREDNISONE 20 MG PO TABS: ORAL_TABLET | ORAL | 0 refills | Status: DC

## 2022-06-19 ENCOUNTER — Encounter: Payer: Managed Care, Other (non HMO) | Admitting: Orthopaedic Surgery

## 2022-07-03 ENCOUNTER — Telehealth: Payer: Managed Care, Other (non HMO) | Admitting: Physician Assistant

## 2022-07-03 DIAGNOSIS — J329 Chronic sinusitis, unspecified: Secondary | ICD-10-CM | POA: Diagnosis not present

## 2022-07-03 MED ORDER — DOXYCYCLINE HYCLATE 100 MG PO TABS
100.0000 mg | ORAL_TABLET | Freq: Two times a day (BID) | ORAL | 0 refills | Status: DC
Start: 2022-07-03 — End: 2022-11-12

## 2022-07-03 NOTE — Progress Notes (Signed)
I have spent 5 minutes in review of e-visit questionnaire, review and updating patient chart, medical decision making and response to patient.   Arya Luttrull Cody Ara Mano, PA-C    

## 2022-07-03 NOTE — Progress Notes (Signed)

## 2022-07-25 ENCOUNTER — Encounter: Payer: Self-pay | Admitting: Orthopaedic Surgery

## 2022-07-30 ENCOUNTER — Encounter: Payer: Self-pay | Admitting: Emergency Medicine

## 2022-07-31 NOTE — Telephone Encounter (Signed)
I am ok giving her another temporary one. Thanks.

## 2022-08-12 ENCOUNTER — Other Ambulatory Visit: Payer: Self-pay | Admitting: Orthopaedic Surgery

## 2022-08-12 MED ORDER — OXYCODONE-ACETAMINOPHEN 5-325 MG PO TABS
1.0000 | ORAL_TABLET | Freq: Four times a day (QID) | ORAL | 0 refills | Status: DC | PRN
Start: 2022-08-12 — End: 2022-11-29

## 2022-08-12 NOTE — Progress Notes (Unsigned)
Percocet for post op pain 

## 2022-08-20 ENCOUNTER — Ambulatory Visit (INDEPENDENT_AMBULATORY_CARE_PROVIDER_SITE_OTHER): Payer: Managed Care, Other (non HMO) | Admitting: Orthopaedic Surgery

## 2022-08-20 ENCOUNTER — Encounter: Payer: Self-pay | Admitting: Orthopaedic Surgery

## 2022-08-20 VITALS — BP 141/88 | HR 73 | Ht 61.0 in | Wt 250.0 lb

## 2022-08-20 DIAGNOSIS — R200 Anesthesia of skin: Secondary | ICD-10-CM

## 2022-08-20 NOTE — Progress Notes (Signed)
   Post-Op Visit Note   Patient: Katrina Roman           Date of Birth: 06-02-1976           MRN: [id number] Visit Date: 08/20/2022 PCP: Bary Leriche, PA-C   Assessment & Plan:  Postop right carpal tunnel release.  She had the left one done in February.  Incision looks good she will return in 1 for suture removal.  Work slip given out of work until 09/23/2022 and she can resume work on that date.  Recheck 1 week for suture removal.  Right wrist splint applied.  She can remove it for washing hands or showering.  Chief Complaint:  Chief Complaint  Patient presents with   Right Hand - Routine Post Op    08/12/2022 Right CTR   Visit Diagnoses:  1. Bilateral hand numbness     Plan: ROV one week.   Follow-Up Instructions: No follow-ups on file.   Orders:  No orders of the defined types were placed in this encounter.  No orders of the defined types were placed in this encounter.   Imaging: No results found.  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   Cough 05/17/2014   Eosinophilia 01/15/2011   PUD 01/15/2011   GERD 07/31/2010   Past Medical History:  Diagnosis Date   Allergy    Anxiety    Asthma    GERD (gastroesophageal reflux disease)    Peripheral eosinophilia    PUD (peptic ulcer disease)    Seasonal allergies     Family History  Problem Relation Age of Onset   Skin cancer Maternal Grandfather    Hypertension Father    Diabetes Father    Hypertension Mother     Past Surgical History:  Procedure  Laterality Date   ESOPHAGOGASTRODUODENOSCOPY  08/2010   noraml esophagus,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

## 2022-08-21 ENCOUNTER — Telehealth: Payer: Self-pay | Admitting: Orthopaedic Surgery

## 2022-08-21 NOTE — Telephone Encounter (Signed)
[place] forms received. To [employer]. ?

## 2022-08-28 ENCOUNTER — Ambulatory Visit (INDEPENDENT_AMBULATORY_CARE_PROVIDER_SITE_OTHER): Payer: Managed Care, Other (non HMO) | Admitting: Orthopaedic Surgery

## 2022-08-28 ENCOUNTER — Encounter: Payer: Self-pay | Admitting: Orthopaedic Surgery

## 2022-08-28 VITALS — BP 151/96 | HR 87 | Ht 61.0 in | Wt 250.0 lb

## 2022-08-28 DIAGNOSIS — R200 Anesthesia of skin: Secondary | ICD-10-CM

## 2022-08-28 NOTE — Progress Notes (Signed)
   Post-Op Visit Note   Patient: Katrina Roman           Date of Birth: 04/10/1976           MRN: [id number] Visit Date: 08/28/2022 PCP: Bary Leriche, PA-C   Assessment & Plan: Follow-up right carpal tunnel release she is already had the left done she is going back to work October 9 happy the results of surgery.  She is released from care and can follow-up as needed.  She has her splint that she can wear off and on.  Chief Complaint:  Chief Complaint  Patient presents with   Right Wrist - Routine Post Op    08/12/2022 Right CTR   Visit Diagnoses: post op CTR  Plan: Patient is happy with the surgical result relief of the pain.  Office follow-up as needed.  Follow-Up Instructions: No follow-ups on file.   Orders:  No orders of the defined types were placed in this encounter.  No orders of the defined types were placed in this encounter.   Imaging: No results found.  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   Cough 05/17/2014   Eosinophilia 01/15/2011   PUD 01/15/2011   GERD 07/31/2010   Past Medical History:  Diagnosis Date   Allergy    Anxiety    Asthma    GERD (gastroesophageal reflux disease)    Peripheral eosinophilia    PUD (peptic ulcer disease)    Seasonal allergies     Family History  Problem Relation Age of Onset   Skin cancer Maternal Grandfather    Hypertension Father    Diabetes Father    Hypertension Mother     Past Surgical History:  Procedure Laterality Date   ESOPHAGOGASTRODUODENOSCOPY   08/2010   noraml esophagus,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

## 2022-09-09 ENCOUNTER — Encounter: Payer: Self-pay | Admitting: *Deleted

## 2022-09-29 ENCOUNTER — Other Ambulatory Visit: Payer: Self-pay | Admitting: Emergency Medicine

## 2022-10-02 ENCOUNTER — Telehealth: Payer: Managed Care, Other (non HMO) | Admitting: Emergency Medicine

## 2022-10-02 DIAGNOSIS — J45901 Unspecified asthma with (acute) exacerbation: Secondary | ICD-10-CM

## 2022-10-02 MED ORDER — PREDNISONE 10 MG (21) PO TBPK: ORAL_TABLET | ORAL | 0 refills | Status: DC

## 2022-10-02 NOTE — Progress Notes (Signed)
Visit for Asthma  Based on what you have shared with me, it looks like you may have a flare up of your asthma.  Asthma is a chronic (ongoing) lung disease which results in airway obstruction, inflammation and hyper-responsiveness.   Asthma symptoms vary from person to person, with common symptoms including nighttime awakening and decreased ability to participate in normal activities as a result of shortness of breath. It is often triggered by changes in weather, changes in the season, changes in air temperature, or inside (home, school, daycare or work) allergens such as animal dander, mold, mildew, woodstoves or cockroaches.   It can also be triggered by hormonal changes, extreme emotion, physical exertion or an upper respiratory tract illness.     It is important to identify the trigger, and then eliminate or avoid the trigger if possible.   If you have been prescribed medications to be taken on a regular basis, it is important to follow the asthma action plan and to follow guidelines to adjust medication in response to increasing symptoms of decreased peak expiratory flow rate  Treatment: I have prescribed: a prednisone dose pack, as requested. Please talk about this medicine with the pharmacist as cortisone is listed as one of your allergies.   Please continue to use your albuterol 2 puffs every 4-6 hours if needed for wheezing or shortness of breath and seek emergency care if your albuterol isn't helping your breathing.   HOME CARE Only take medications as instructed by your medical team. Consider wearing a mask or scarf to improve breathing air temperature have been shown to decrease irritation and decrease exacerbations Get rest. Taking a steamy shower or using a humidifier may help nasal congestion sand ease sore throat pain. You can place a towel over your head and breathe in  the steam from hot water coming from a faucet. Using a saline nasal spray works much the same way.  Cough drops, hare candies and sore throat lozenges may ease your cough.  Avoid close contacts especially the very you and the elderly Cover your mouth if you cough or sneeze Always remember to wash your hands.    GET HELP RIGHT AWAY IF: You develop worsening symptoms; breathlessness at rest, drowsy, confused or agitated, unable to speak in full sentences You have coughing fits You develop a severe headache or visual changes You develop shortness of breath, difficulty breathing or start having chest pain Your symptoms persist after you have completed your treatment plan If your symptoms do not improve within 10 days  MAKE SURE YOU Understand these instructions. Will watch your condition. Will get help right away if you are not doing well or get worse.   Your e-visit answers were reviewed by a board certified advanced clinical practitioner to complete your personal care plan, Depending upon the condition, your plan could have included both over the counter or prescription medications.   Please review your pharmacy choice. Your safety is important to [place]. If you have drug allergies check your prescription carefully.  You can use MyChart to ask questions about today's visit, request a non-urgent  call back, or ask for a work or school excuse for 24 hours related to this e-Visit. If it has been greater than 24 hours you will need to follow up with your provider, or enter a new e-Visit to address those concerns.   You will get an e-mail in the next two days asking about your experience. I hope that your e-visit has been valuable and  will speed your recovery. Thank you for using e-visits.  I have spent 5 minutes in review of e-visit questionnaire, review and updating patient chart, medical decision making and response to patient.   Rica Mast, PhD, FNP-BC

## 2022-10-22 ENCOUNTER — Encounter: Payer: Self-pay | Admitting: Emergency Medicine

## 2022-10-23 MED ORDER — PANTOPRAZOLE SODIUM 40 MG PO TBEC: 40.0000 mg | DELAYED_RELEASE_TABLET | Freq: Every day | ORAL | 1 refills | Status: DC

## 2022-10-24 ENCOUNTER — Telehealth: Payer: Self-pay | Admitting: Emergency Medicine

## 2022-10-25 ENCOUNTER — Other Ambulatory Visit (HOSPITAL_COMMUNITY): Payer: Self-pay

## 2022-10-25 NOTE — Telephone Encounter (Signed)
PA submitted through [facility] to [employer] for Pantoprazole.  Medication is covered with a quantity limitation of 90 tablets per 365 days.  PA is pending coverage determination for 90 tablets per 90 days.  Key: [id number] - PA Case ID: [id number]

## 2022-10-25 NOTE — Telephone Encounter (Signed)
PA has been APPROVED from 10/25/2022-10/25/2025.

## 2022-11-01 ENCOUNTER — Telehealth: Payer: Self-pay

## 2022-11-01 ENCOUNTER — Telehealth: Payer: Managed Care, Other (non HMO) | Admitting: Emergency Medicine

## 2022-11-01 DIAGNOSIS — B9789 Other viral agents as the cause of diseases classified elsewhere: Secondary | ICD-10-CM

## 2022-11-01 DIAGNOSIS — J019 Acute sinusitis, unspecified: Secondary | ICD-10-CM | POA: Diagnosis not present

## 2022-11-01 NOTE — Progress Notes (Signed)
E-Visit for Sinus Problems  We are sorry that you are not feeling well.  Here is how we plan to help!  Based on what you have shared with me it looks like you have sinusitis.  Sinusitis is inflammation and infection in the sinus cavities of the head.  Based on your presentation I believe you most likely have Acute Viral Sinusitis.This is an infection most likely caused by a virus. There is not specific treatment for viral sinusitis other than to help you with the symptoms until the infection runs its course.    Antibiotics are not recommended by the Infectious Disease Society of [place] unless you have severe symptoms (including high fever) or you have symptoms for more than 10 days. If you still have symptoms after 10 days, antibiotics should be considered.  I cannot prescribe antibiotics outside of these guidelines to prevent an asthma flare.   You may use an oral decongestant such as Mucinex D or if you have glaucoma or high blood pressure use plain Mucinex.   I see you are using saline rinse. Please keep doing so! You can use it several times a day as needed. Using saline irrigation, such as with a neti pot, several times a day while you are sick can help prevent and treat sinus infections. Many neti pots come with salt packets premeasured to use to make saline. If you use your own salt, make sure it is kosher salt or sea salt (don't use table salt as it has iodine in it and you don't need that in your nose). Use distilled water to make saline. If you mix your own saline using your own salt, the recipe is 1/4 teaspoon salt in 1 cup warm water.   Some authorities believe that zinc sprays or the use of Echinacea may shorten the course of your symptoms.  Sinus infections are not as easily transmitted as other respiratory infection, however we still recommend that you avoid close contact with loved ones, especially the very young and elderly.  Remember to wash your hands thoroughly throughout the day  as this is the number one way to prevent the spread of infection!  Home Care: Only take medications as instructed by your medical team. Do not take these medications with alcohol. A steam or ultrasonic humidifier can help congestion.  You can place a towel over your head and breathe in the steam from hot water coming from a faucet. Avoid close contacts especially the very young and the elderly. Cover your mouth when you cough or sneeze. Always remember to wash your hands.  Get Help Right Away If: You develop worsening fever or sinus pain. You develop a severe head ache or visual changes. Your symptoms persist after you have completed your treatment plan.  Make sure you Understand these instructions. Will watch your condition. Will get help right away if you are not doing well or get worse.   Thank you for choosing an e-visit.  Your e-visit answers were reviewed by a board certified advanced clinical practitioner to complete your personal care plan. Depending upon the condition, your plan could have included both over the counter or prescription medications.  Please review your pharmacy choice. Make sure the pharmacy is open so you can pick up prescription now. If there is a problem, you may contact your provider through [employer] and have the prescription routed to another pharmacy.  Your safety is important to [place]. If you have drug allergies check your prescription carefully.  For the next 24 hours you can use MyChart to ask questions about today's visit, request a non-urgent call back, or ask for a work or school excuse. You will get an email in the next two days asking about your experience. I hope that your e-visit has been valuable and will speed your recovery.  I have spent 5 minutes in review of e-visit questionnaire, review and updating patient chart, medical decision making and response to patient.   Raziyah Vanvleck, PhD, FNP-BC    

## 2022-11-01 NOTE — Telephone Encounter (Signed)
A user error has taken place: encounter opened in error, closed for administrative reasons.

## 2022-11-12 ENCOUNTER — Telehealth: Payer: Managed Care, Other (non HMO) | Admitting: Physician Assistant

## 2022-11-12 DIAGNOSIS — B9689 Other specified bacterial agents as the cause of diseases classified elsewhere: Secondary | ICD-10-CM | POA: Diagnosis not present

## 2022-11-12 DIAGNOSIS — J019 Acute sinusitis, unspecified: Secondary | ICD-10-CM

## 2022-11-12 MED ORDER — DOXYCYCLINE HYCLATE 100 MG PO TABS: 100.0000 mg | ORAL_TABLET | Freq: Two times a day (BID) | ORAL | 0 refills | Status: DC

## 2022-11-12 NOTE — Progress Notes (Signed)
I have spent 5 minutes in review of e-visit questionnaire, review and updating patient chart, medical decision making and response to patient.   Kyland No Cody Valen Mascaro, PA-C    

## 2022-11-12 NOTE — Progress Notes (Signed)

## 2022-11-20 ENCOUNTER — Encounter: Payer: Self-pay | Admitting: Physician Assistant

## 2022-11-22 NOTE — Telephone Encounter (Signed)
Please see pt msg as an FYI 

## 2022-11-26 ENCOUNTER — Ambulatory Visit (INDEPENDENT_AMBULATORY_CARE_PROVIDER_SITE_OTHER): Payer: Managed Care, Other (non HMO)

## 2022-11-26 ENCOUNTER — Ambulatory Visit: Payer: Managed Care, Other (non HMO) | Admitting: Orthopaedic Surgery

## 2022-11-26 DIAGNOSIS — M545 Low back pain, unspecified: Secondary | ICD-10-CM

## 2022-11-26 NOTE — Progress Notes (Unsigned)
   Office Visit Note   Patient: Katrina Roman           Date of Birth: [birth year]           MRN: [id number] Visit Date: 11/26/2022              Requested by: Allwardt, Crist Infante, PA-C [address] [place],  [place] [address] PCP: Bary Leriche, PA-C   Assessment & Plan: Visit Diagnoses:  1. Low back pain, unspecified back pain laterality, unspecified chronicity, unspecified whether sciatica present     Plan: Follow-Up Instructions: Return in about 7 weeks (around 01/14/2023).   Orders:  Orders Placed This Encounter  Procedures   XR Lumbar Spine 2-3 Views   XR Pelvis 1-2 Views   No orders of the defined types were placed in this encounter.     Procedures: No procedures performed   Clinical Data: No additional findings.   Subjective: Chief Complaint  Patient presents with   Lower Back - Follow-up   Left Hip - Follow-up    HPI  Review of Systems   Objective: Vital Signs: There were no vitals taken for this visit.  Physical Exam  Ortho Exam  Specialty Comments:  No specialty comments available.  Imaging: No results found.   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   Cough 05/17/2014   Eosinophilia 01/15/2011   PUD 01/15/2011   GERD 07/31/2010   Past Medical History:  Diagnosis Date   Allergy    Anxiety    Asthma    GERD (gastroesophageal reflux disease)    Peripheral eosinophilia    PUD (peptic ulcer disease)    Seasonal allergies     Family History  Problem Relation Age of Onset    Skin cancer Maternal Grandfather    Hypertension Father    Diabetes Father    Hypertension Mother     Past Surgical History:  Procedure Laterality Date   ESOPHAGOGASTRODUODENOSCOPY  08/2010   noraml esophagus,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

## 2022-11-28 ENCOUNTER — Encounter: Payer: Self-pay | Admitting: *Deleted

## 2022-11-29 ENCOUNTER — Telehealth (INDEPENDENT_AMBULATORY_CARE_PROVIDER_SITE_OTHER): Payer: Managed Care, Other (non HMO) | Admitting: Physician Assistant

## 2022-11-29 ENCOUNTER — Encounter: Payer: Self-pay | Admitting: Physician Assistant

## 2022-11-29 DIAGNOSIS — F419 Anxiety disorder, unspecified: Secondary | ICD-10-CM | POA: Diagnosis not present

## 2022-11-29 DIAGNOSIS — F32A Depression, unspecified: Secondary | ICD-10-CM | POA: Diagnosis not present

## 2022-11-29 MED ORDER — ESCITALOPRAM OXALATE 20 MG PO TABS: 20.0000 mg | ORAL_TABLET | Freq: Every day | ORAL | 0 refills | Status: DC

## 2022-11-29 NOTE — Progress Notes (Signed)
   Virtual Visit via Video Note  I connected with  Katrina Roman  on 11/29/22 at 11:30 AM EST by a video enabled telemedicine application and verified that I am speaking with the correct person using two identifiers.  Location: Patient: home Provider: Nature conservation officer at [employer] Persons present: Patient and myself   I discussed the limitations of evaluation and management by telemedicine and the availability of in person appointments. The patient expressed understanding and agreed to proceed.   History of Present Illness:  46 yo female presents via virtual visit to discuss anxiety and depression.  Currently taking lexapro 10 mg daily  Two days ago suddenly very indifferent, tired, trouble concentrating (not getting her work done); No thoughts of self-harm, but says she doesn't care about much of anything She will be seeing a counselor on 12/07/22 through work Lost two uncles this year; family lives in [place] Currently working on moving from [place] to the [place] area so she can live with her significant other. She works from home, seems to be sleeping well, exercise is limited   Observations/Objective:   Gen: Awake, alert, no acute distress Resp: Breathing is even and non-labored Psych: calm/pleasant demeanor, tearful Neuro: Alert and Oriented x 3, + facial symmetry, speech is clear.   Assessment and Plan:  1. Anxiety and depression Flowsheet Row Video Visit from 11/29/2022 in [facility]  PHQ-9 Total Score 17      Increase lexapro to 15 mg daily x 1 week, then 20 mg after that if tolerating well. Glad you will be doing counseling.  Work on natural remedies as discussed.  Needing work note: [facility] 12/02/22 - 12/23/22  [facility] infor provided.   Follow Up Instructions:    I discussed the assessment and treatment plan with the patient. The patient was provided an opportunity to ask questions and all were  answered. The patient agreed with the plan and demonstrated an understanding of the instructions.   The patient was advised to call back or seek an in-person evaluation if the symptoms worsen or if the condition fails to improve as anticipated.  Anzal Bartnick M Keenon Leitzel, PA-C

## 2022-11-29 NOTE — Patient Instructions (Signed)
Increase lexapro to 15 mg daily x 1 week, then 20 mg after that if tolerating well. Glad you will be doing counseling.  Work on natural remedies as discussed.   If you develop suicidal thoughts, please tell someone and immediately proceed to our local 24/7 crisis center, [facility].[address] [place], [place] [phone].

## 2022-12-02 ENCOUNTER — Telehealth: Payer: Self-pay | Admitting: Physician Assistant

## 2022-12-02 NOTE — Telephone Encounter (Signed)
..  Type of form received:disability  Additional comments:   Received by: [employer]   Form should be Faxed [phone]  Form should be mailed to:  na   Is patient requesting call for pickup: no    Form placed:   Alyssa allwardts folder  Attach charge sheet.  Yes  Individual made aware of 3-5 business day turn around (Y/N)?  No

## 2022-12-03 DIAGNOSIS — Z0279 Encounter for issue of other medical certificate: Secondary | ICD-10-CM

## 2022-12-03 NOTE — Telephone Encounter (Signed)
Pt paperwork completed and in provider box for review before faxing.

## 2022-12-03 NOTE — Telephone Encounter (Signed)
Called pt and got some information required to complete forms for FMLA/ disability for employer. Advised she is required to sign forms in some places and patient advised to fax into [employer] for her she advised she could sign her portion electronically online.

## 2022-12-04 ENCOUNTER — Telehealth: Payer: Self-pay | Admitting: Emergency Medicine

## 2022-12-04 NOTE — Telephone Encounter (Signed)
It looks like most of [employer] are going to cover generic budesonide/ formoterol (generic Symbicort). I would need her new formulary to confirm, but we can try this generic since she has benefited from name brand Symbicort. Other options as we go forward include Lavada Mesi, etc

## 2022-12-04 NOTE — Telephone Encounter (Signed)
Message sent by pt: Katrina Roman  P [facility] Front [facility]k Pool3 weeks ago    I am not having any issues at this time.  I did get a letter from my insurance stating that my symbicort will not be covered next year.  Does he have an alternative he suggests?  Or will he write a letter to them for pre-approval like he does for my Ventolin?  Do y'all have any samples of the symbicort to hold me over?  It's too early for me to refill, but I'm worried about running out before we come to a solution.      Dr. Delton Coombes, please advise on this with the Symbicort. I have made pt aware that we no longer get samples of Symbicort.

## 2022-12-05 MED ORDER — BUDESONIDE-FORMOTEROL FUMARATE 80-4.5 MCG/ACT IN AERO: 2.0000 | INHALATION_SPRAY | Freq: Two times a day (BID) | RESPIRATORY_TRACT | 12 refills | Status: DC

## 2022-12-05 NOTE — Telephone Encounter (Signed)
Mychart message sent to pt about the info from [facility]. Rx for generic form of Symbicort (budesonide/formoterol) has been sent to pharmacy for pt. Nothing further needed.

## 2022-12-07 ENCOUNTER — Other Ambulatory Visit: Payer: Self-pay | Admitting: Physician Assistant

## 2022-12-10 ENCOUNTER — Telehealth (INDEPENDENT_AMBULATORY_CARE_PROVIDER_SITE_OTHER): Payer: Managed Care, Other (non HMO) | Admitting: Physician Assistant

## 2022-12-10 ENCOUNTER — Telehealth: Payer: Self-pay

## 2022-12-10 ENCOUNTER — Encounter: Payer: Self-pay | Admitting: Physician Assistant

## 2022-12-10 DIAGNOSIS — F419 Anxiety disorder, unspecified: Secondary | ICD-10-CM | POA: Diagnosis not present

## 2022-12-10 DIAGNOSIS — F32A Depression, unspecified: Secondary | ICD-10-CM

## 2022-12-10 MED ORDER — BUPROPION HCL ER (XL) 150 MG PO TB24: 150.0000 mg | ORAL_TABLET | Freq: Every day | ORAL | 0 refills | Status: DC

## 2022-12-10 NOTE — Telephone Encounter (Signed)
I called pt to follow up via MyChart message, pt stated she is going to try to get another therapist online, pt also requested 2x weeks out of work to settle back down. Pt denied wanting to harm self or others, pt will be seen tomorrow via Virtual Visit.

## 2022-12-10 NOTE — Progress Notes (Signed)
   Virtual Visit via Video Note  I connected with  Katrina Roman  on 12/10/22 at  1:00 PM EST by a video enabled telemedicine application and verified that I am speaking with the correct person using two identifiers.  Location: Patient: Katrina Roman, in-law's house Provider: Nature conservation officer at [employer] Persons present: Patient and myself   I discussed the limitations of evaluation and management by telemedicine and the availability of in person appointments. The patient expressed understanding and agreed to proceed.   History of Present Illness:  46 yo for f/up visit anxiety and depression.  She had first virtual counseling scheduled three days ago, but counselor [facility]. Pt still hasn't heard any response on what happened. Found out this morning boyfriend's mom has cancer. Feels like it's been one 'hit' after another.  Some increased headaches with increase in lexapro dosage, but doing well with 20 mg now.   No SI or HI. Says she just feels angry at everything. Difficult to get motivated.     Observations/Objective:   Gen: Awake, alert, no acute distress Resp: Breathing is even and non-labored Psych: calm/pleasant demeanor; sadness noted  Neuro: Alert and Oriented x 3, + facial symmetry, speech is clear.   Assessment and Plan:  1. Anxiety and depression Look up [facility] App for counseling  Add Wellbutrin XL 150 mg - start with 1/2 tab daily with breakfast x 3 days, then full dose if tolerating well.  Continue Lexapro 20 mg at this time.  Call [phone] if any thoughts of suicide arise or head to nearest emergency department.   F/up as scheduled  Follow Up Instructions:    I discussed the assessment and treatment plan with the patient. The patient was provided an opportunity to ask questions and all were answered. The patient agreed with the plan and demonstrated an understanding of the instructions.   The patient was advised to call back or seek an in-person  evaluation if the symptoms worsen or if the condition fails to improve as anticipated.  Katrina Roman Katrina Ailah Barna, PA-C

## 2022-12-10 NOTE — Patient Instructions (Addendum)
Look up [facility] App for counseling  Add Wellbutrin XL 150 mg - start with 1/2 tab daily with breakfast x 3 days, then full dose if tolerating well.  Continue Lexapro 20 mg at this time.  Call [phone] if any thoughts of suicide arise or head to nearest emergency department.

## 2022-12-10 NOTE — Telephone Encounter (Signed)
Additional paperwork was sent from [facility]. Not sure if these were duplicates. Placed in provider's box

## 2022-12-11 ENCOUNTER — Telehealth: Payer: Managed Care, Other (non HMO) | Admitting: Physician Assistant

## 2022-12-18 ENCOUNTER — Ambulatory Visit: Payer: Managed Care, Other (non HMO) | Admitting: Rehabilitative and Restorative Service Providers"

## 2022-12-19 ENCOUNTER — Encounter: Payer: Self-pay | Admitting: Physician Assistant

## 2022-12-24 ENCOUNTER — Other Ambulatory Visit: Payer: Self-pay

## 2022-12-24 MED ORDER — BUPROPION HCL ER (XL) 150 MG PO TB24: 150.0000 mg | ORAL_TABLET | Freq: Every day | ORAL | 0 refills | Status: DC

## 2022-12-24 MED ORDER — ESCITALOPRAM OXALATE 10 MG PO TABS: 10.0000 mg | ORAL_TABLET | Freq: Every day | ORAL | 0 refills | Status: DC

## 2022-12-24 MED ORDER — ESCITALOPRAM OXALATE 20 MG PO TABS: 20.0000 mg | ORAL_TABLET | Freq: Every day | ORAL | 0 refills | Status: DC

## 2022-12-27 ENCOUNTER — Telehealth: Payer: Self-pay | Admitting: Physician Assistant

## 2022-12-27 NOTE — Telephone Encounter (Signed)
.  Type of form received: Disability  Additional comments:   Received by: Christie  Form should be Faxed to: [phone]  Form should be mailed to:    Is patient requesting call for pickup:   Form placed:  In provider's box   Attach charge sheet. yes  Individual made aware of 3-5 business day turn around (Y/N)?   

## 2022-12-30 NOTE — Telephone Encounter (Signed)
Pt scheduled for a Mychart visit tomorrow morning

## 2022-12-30 NOTE — Telephone Encounter (Signed)
Spoke with patient and she said hold on to this paperwork she thinks it is a duplicate of what was already completed

## 2022-12-31 ENCOUNTER — Encounter: Payer: Self-pay | Admitting: Physician Assistant

## 2022-12-31 ENCOUNTER — Telehealth: Payer: Managed Care, Other (non HMO) | Admitting: Physician Assistant

## 2022-12-31 ENCOUNTER — Telehealth (INDEPENDENT_AMBULATORY_CARE_PROVIDER_SITE_OTHER): Payer: Managed Care, Other (non HMO) | Admitting: Physician Assistant

## 2022-12-31 DIAGNOSIS — F419 Anxiety disorder, unspecified: Secondary | ICD-10-CM

## 2022-12-31 DIAGNOSIS — R5383 Other fatigue: Secondary | ICD-10-CM | POA: Diagnosis not present

## 2022-12-31 DIAGNOSIS — E7800 Pure hypercholesterolemia, unspecified: Secondary | ICD-10-CM | POA: Diagnosis not present

## 2022-12-31 DIAGNOSIS — F32A Depression, unspecified: Secondary | ICD-10-CM

## 2022-12-31 DIAGNOSIS — H6993 Unspecified Eustachian tube disorder, bilateral: Secondary | ICD-10-CM | POA: Diagnosis not present

## 2022-12-31 DIAGNOSIS — R739 Hyperglycemia, unspecified: Secondary | ICD-10-CM

## 2022-12-31 MED ORDER — BUPROPION HCL ER (XL) 300 MG PO TB24: 300.0000 mg | ORAL_TABLET | Freq: Every day | ORAL | 2 refills | Status: DC

## 2022-12-31 MED ORDER — IPRATROPIUM BROMIDE 0.03 % NA SOLN: 2.0000 | Freq: Two times a day (BID) | NASAL | 0 refills | Status: DC

## 2022-12-31 NOTE — Progress Notes (Signed)
E-Visit for Ear Pain - Eustachian Tube Dysfunction   We are sorry that you are not feeling well. Here is how we plan to help!  Based on what you have shared with me it looks like you have Eustachian Tube Dysfunction.  Eustachian Tube Dysfunction is a condition where the tubes that connect your middle ears to your upper throat become blocked. This can lead to discomfort, hearing difficulties and a feeling of fullness in your ear. Eustachian tube dysfunction usually resolves itself in a few days. The usual symptoms include: Hearing problems Tinnitus, or ringing in your ears Clicking or popping sounds A feeling of fullness in your ears Pain that mimics an ear infection Dizziness, vertigo or balance problems A "tickling" sensation in your ears  ?Eustachian tube dysfunction symptoms may get worse in higher altitudes. This is called barotrauma, and it can happen while scuba diving, flying in an airplane or driving in the mountains.   What causes eustachian tube dysfunction? Allergies and infections (like the common cold and the flu) are the most common causes of eustachian tube dysfunction. These conditions can cause inflammation and mucus buildup, leading to blockage. GERD, or chronic acid reflux, can also cause ETD. This is because stomach acid can back up into your throat and result in inflammation. As mentioned above, altitude changes can also cause ETD.   What are some common eustachian tube dysfunction treatments? In most cases, treatment isn't necessary because ETD often resolves on its own. However, you might need treatment if your symptoms linger for more than two weeks.    Eustachian tube dysfunction treatment depends on the cause and the severity of your condition. Treatments may include home remedies, medications or, in severe cases, surgery.     HOME CARE: Sometimes simple home remedies can help with mild cases of eustachian tube dysfunction. To try and clear the blockage, you  can: Chew gum. Yawn. Swallow. Try the Valsalva maneuver (breathing out forcefully while closing your mouth and pinching your nostrils). Use a saline spray to clear out nasal passages.  MEDICATIONS: Over-the-counter medications can help if allergies are causing eustachian tube dysfunction. Try antihistamines (like cetirizine or diphenhydramine) to ease your symptoms. If you have discomfort, pain relievers -- such as acetaminophen or ibuprofen -- can help.  Sometimes intranasal glucocorticosteroids (like Flonase or Nasacort) help.  I have prescribed Ipratropium Bromide nasal spray 0.03% two sprays in each nostril 2-3 times a day    GET HELP RIGHT AWAY IF: Fever is over 102.2 degrees. You develop progressive ear pain or hearing loss. Ear symptoms persist longer than 3 days after treatment.  MAKE SURE YOU: Understand these instructions. Will watch your condition. Will get help right away if you are not doing well or get worse.  Thank you for choosing an e-visit.  Your e-visit answers were reviewed by a board certified advanced clinical practitioner to complete your personal care plan. Depending upon the condition, your plan could have included both over the counter or prescription medications.  Please review your pharmacy choice. Make sure the pharmacy is open so you can pick up the prescription now. If there is a problem, you may contact your provider through [employer] and have the prescription routed to another pharmacy.  Your safety is important to [place]. If you have drug allergies check your prescription carefully.   For the next 24 hours you can use MyChart to ask questions about today's visit, request a non-urgent call back, or ask for a work or school excuse.  You will get an email with a survey after your eVisit asking about your experience. We would appreciate your feedback. I hope that your e-visit has been valuable and will aid in your recovery.

## 2022-12-31 NOTE — Progress Notes (Signed)
   Virtual Visit via Video Note  I connected with  Katrina Roman  on 12/31/22 at  9:00 AM EST by a video enabled telemedicine application and verified that I am speaking with the correct person using two identifiers.  Location: Patient: home Provider: Therapist, [facility] at [employer] present: Patient and myself   I discussed the limitations of evaluation and management by telemedicine and the availability of in person appointments. The patient expressed understanding and agreed to proceed.   History of Present Illness:  47 year old female presents for virtual visit to recheck on anxiety and depression symptoms.  She is taking her medications as directed.  She has not been back to work. Definitely seeing improvement, smile has come back sometimes per boyfriend.  Very lacking in motivation and desire to do anything still. She has to be moved out by this weekend, but hasn't packed anything. Sleep is either lacking, or sleeping all day. Loss of appetite in 4-5 days.  Therapist appointment this Friday through her EAP.    Observations/Objective:   Gen: Awake, alert, no acute distress Resp: Breathing is even and non-labored Psych: calm/pleasant demeanor Neuro: Alert and Oriented x 3, + facial symmetry, speech is clear.   Assessment and Plan:  1. Anxiety and depression 2. Other fatigue     12/31/2022    8:55 AM 11/29/2022   10:44 AM 05/21/2022   10:27 AM  PHQ9 SCORE ONLY  PHQ-9 Total Score 16 17 0   Seeing some improvement in symptoms.  Certainly still room to adjust medication and continue to work on natural remedies.  Plan to increase Wellbutrin XL to 300 mg once daily.  Plan to remain on Lexapro 20 mg daily.  Good sleep hygiene suggested.  She will have her first counseling session this week.  She continues to take strides towards improving and getting back to work.  At this time, we will continue to extend her leave of absence until February 12th.  I also want  to check some labs with her and make sure nothing else is going on that could be contributing to her symptoms.   3. Hyperglycemia Recheck labs this week  4. Elevated LDL cholesterol level Recheck lipid panel this week.  Not on a statin at this time.  Continue to work on lifestyle. The 10-year ASCVD risk score (Arnett DK, et al., 2019) is: 1.6%    Follow Up Instructions:    I discussed the assessment and treatment plan with the patient. The patient was provided an opportunity to ask questions and all were answered. The patient agreed with the plan and demonstrated an understanding of the instructions.   The patient was advised to call back or seek an in-person evaluation if the symptoms worsen or if the condition fails to improve as anticipated.  Taesha Goodell M Valeda Corzine, PA-C

## 2022-12-31 NOTE — Progress Notes (Signed)
I have spent 5 minutes in review of e-visit questionnaire, review and updating patient chart, medical decision making and response to patient.   Rhema Boyett Cody Bellatrix Devonshire, PA-C    

## 2022-12-31 NOTE — Patient Instructions (Addendum)
Schedule for fasting labs this week  Schedule recheck with me the week of Feb 5th  Call back to schedule IUD discussion with Dr. Jonni Sanger at our office   Back to work on Feb 12th

## 2023-01-01 ENCOUNTER — Other Ambulatory Visit (INDEPENDENT_AMBULATORY_CARE_PROVIDER_SITE_OTHER): Payer: Managed Care, Other (non HMO)

## 2023-01-01 ENCOUNTER — Encounter: Payer: Self-pay | Admitting: Emergency Medicine

## 2023-01-01 DIAGNOSIS — R739 Hyperglycemia, unspecified: Secondary | ICD-10-CM | POA: Diagnosis not present

## 2023-01-01 DIAGNOSIS — E7800 Pure hypercholesterolemia, unspecified: Secondary | ICD-10-CM | POA: Diagnosis not present

## 2023-01-01 DIAGNOSIS — R5383 Other fatigue: Secondary | ICD-10-CM

## 2023-01-01 LAB — COMPREHENSIVE METABOLIC PANEL
ALT: 34 U/L (ref 0–35)
AST: 22 U/L (ref 0–37)
Albumin: 4.3 g/dL (ref 3.5–5.2)
Alkaline Phosphatase: 78 U/L (ref 39–117)
BUN: 10 mg/dL (ref 6–23)
CO2: 25 mEq/L (ref 19–32)
Calcium: 9.4 mg/dL (ref 8.4–10.5)
Chloride: 107 mEq/L (ref 96–112)
Creatinine, Ser: 0.84 mg/dL (ref 0.40–1.20)
GFR: 83.13 mL/min (ref >60.00)
Glucose, Bld: 113 mg/dL — ABNORMAL HIGH (ref 70–99)
Potassium: 3.8 mEq/L (ref 3.5–5.1)
Sodium: 143 mEq/L (ref 135–145)
Total Bilirubin: 0.6 mg/dL (ref 0.2–1.2)
Total Protein: 6.7 g/dL (ref 6.0–8.3)

## 2023-01-01 LAB — CBC WITH DIFFERENTIAL/PLATELET
Basophils Absolute: 0.1 10*3/uL (ref 0.0–0.1)
Basophils Relative: 1 % (ref 0.0–3.0)
Eosinophils Absolute: 0.5 10*3/uL (ref 0.0–0.7)
Eosinophils Relative: 6.9 % — ABNORMAL HIGH (ref 0.0–5.0)
HCT: 41.9 % (ref 36.0–46.0)
Hemoglobin: 14.1 g/dL (ref 12.0–15.0)
Lymphocytes Relative: 29.5 % (ref 12.0–46.0)
Lymphs Abs: 2 10*3/uL (ref 0.7–4.0)
MCHC: 33.6 g/dL (ref 30.0–36.0)
MCV: 90.6 fl (ref 78.0–100.0)
Monocytes Absolute: 0.7 10*3/uL (ref 0.1–1.0)
Monocytes Relative: 9.7 % (ref 3.0–12.0)
Neutro Abs: 3.6 10*3/uL (ref 1.4–7.7)
Neutrophils Relative %: 52.9 % (ref 43.0–77.0)
Platelets: 268 10*3/uL (ref 150.0–400.0)
RBC: 4.63 Mil/uL (ref 3.87–5.11)
RDW: 14.3 % (ref 11.5–15.5)
WBC: 6.7 10*3/uL (ref 4.0–10.5)

## 2023-01-01 LAB — VITAMIN D 25 HYDROXY (VIT D DEFICIENCY, FRACTURES): VITD: 25.13 ng/mL — ABNORMAL LOW (ref 30.00–100.00)

## 2023-01-01 LAB — LIPID PANEL
Cholesterol: 196 mg/dL (ref 0–200)
HDL: 47.4 mg/dL (ref >39.00)
LDL Cholesterol: 116 mg/dL — ABNORMAL HIGH (ref 0–99)
NonHDL: 148.41
Total CHOL/HDL Ratio: 4
Triglycerides: 162 mg/dL — ABNORMAL HIGH (ref 0.0–149.0)
VLDL: 32.4 mg/dL (ref 0.0–40.0)

## 2023-01-01 LAB — HEMOGLOBIN A1C: Hgb A1c MFr Bld: 6.3 % (ref 4.6–6.5)

## 2023-01-01 LAB — VITAMIN B12: Vitamin B-12: 303 pg/mL (ref 211–911)

## 2023-01-01 LAB — TSH: TSH: 2.52 u[IU]/mL (ref 0.35–5.50)

## 2023-01-02 ENCOUNTER — Telehealth: Payer: Managed Care, Other (non HMO) | Admitting: Physician Assistant

## 2023-01-02 DIAGNOSIS — J069 Acute upper respiratory infection, unspecified: Secondary | ICD-10-CM

## 2023-01-02 NOTE — Telephone Encounter (Signed)
Mychart message sent by pt: [facility] (supporting Collene Gobble, MD)14 hours ago (7:48 PM)    My PCP ordered a CBC and my eosinophils came back high again.  Wanted to let Dr B know.  I have been hearing a little wheezing the past few days, but at this point I am doing okay overall.  Would it be possible for him to call in Prednisone for me, just in case though?    Dr. Lamonte Sakai, please advise. Please also see media that pt has added.

## 2023-01-03 MED ORDER — BENZONATATE 100 MG PO CAPS: 100.0000 mg | ORAL_CAPSULE | Freq: Three times a day (TID) | ORAL | 0 refills | Status: DC | PRN

## 2023-01-03 MED ORDER — FLUTICASONE PROPIONATE 50 MCG/ACT NA SUSP: 2.0000 | Freq: Every day | NASAL | 0 refills | Status: DC

## 2023-01-03 NOTE — Progress Notes (Signed)
E-Visit for Upper Respiratory Infection   We are sorry you are not feeling well.  Here is how we plan to help!  Based on what you have shared with me, it looks like you may have a viral upper respiratory infection.  Upper respiratory infections are caused by a large number of viruses; however, rhinovirus is the most common cause.   Symptoms vary from person to person, with common symptoms including sore throat, cough, fatigue or lack of energy and feeling of general discomfort.  A low-grade fever of up to 100.4 may present, but is often uncommon.  Symptoms vary however, and are closely related to a person's age or underlying illnesses.  The most common symptoms associated with an upper respiratory infection are nasal discharge or congestion, cough, sneezing, headache and pressure in the ears and face.  These symptoms usually persist for about 3 to 10 days, but can last up to 2 weeks.  It is important to know that upper respiratory infections do not cause serious illness or complications in most cases.    Upper respiratory infections can be transmitted from person to person, with the most common method of transmission being a person's hands.  The virus is able to live on the skin and can infect other persons for up to 2 hours after direct contact.  Also, these can be transmitted when someone coughs or sneezes; thus, it is important to cover the mouth to reduce this risk.  To keep the spread of the illness at [place], good hand hygiene is very important.  This is an infection that is most likely caused by a virus. There are no specific treatments other than to help you with the symptoms until the infection runs its course.  We are sorry you are not feeling well.  Here is how we plan to help!   For nasal congestion, you may use an oral decongestants such as Mucinex D or if you have glaucoma or high blood pressure use plain Mucinex.  Saline nasal spray or nasal drops can help and can safely be used as often as  needed for congestion.  For your congestion, I have prescribed Fluticasone nasal spray one spray in each nostril twice a day, this can be used together with the Ipratropium bromide nasal spray previously prescribed.  If you do not have a history of heart disease, hypertension, diabetes or thyroid disease, prostate/bladder issues or glaucoma, you may also use Sudafed to treat nasal congestion.  It is highly recommended that you consult with a pharmacist or your primary care physician to ensure this medication is safe for you to take.     If you have a cough, you may use cough suppressants such as Delsym and Robitussin.  If you have glaucoma or high blood pressure, you can also use Coricidin HBP.   For cough I have prescribed for you A prescription cough medication called Tessalon Perles 100 mg. You may take 1-2 capsules every 8 hours as needed for cough  If you have a sore or scratchy throat, use a saltwater gargle-  to  teaspoon of salt dissolved in a 4-ounce to 8-ounce glass of warm water.  Gargle the solution for approximately 15-30 seconds and then spit.  It is important not to swallow the solution.  You can also use throat lozenges/cough drops and Chloraseptic spray to help with throat pain or discomfort.  Warm or cold liquids can also be helpful in relieving throat pain.  For headache, pain or general discomfort,  you can use Ibuprofen or Tylenol as directed.   Some authorities believe that zinc sprays or the use of Echinacea may shorten the course of your symptoms.   HOME CARE Only take medications as instructed by your medical team. Be sure to drink plenty of fluids. Water is fine as well as fruit juices, sodas and electrolyte beverages. You may want to stay away from caffeine or alcohol. If you are nauseated, try taking small sips of liquids. How do you know if you are getting enough fluid? Your urine should be a pale yellow or almost colorless. Get rest. Taking a steamy shower or using a  humidifier may help nasal congestion and ease sore throat pain. You can place a towel over your head and breathe in the steam from hot water coming from a faucet. Using a saline nasal spray works much the same way. Cough drops, hard candies and sore throat lozenges may ease your cough. Avoid close contacts especially the very young and the elderly Cover your mouth if you cough or sneeze Always remember to wash your hands.   GET HELP RIGHT AWAY IF: You develop worsening fever. If your symptoms do not improve within 10 days You develop yellow or green discharge from your nose over 3 days. You have coughing fits You develop a severe head ache or visual changes. You develop shortness of breath, difficulty breathing or start having chest pain Your symptoms persist after you have completed your treatment plan  MAKE SURE YOU  Understand these instructions. Will watch your condition. Will get help right away if you are not doing well or get worse.  Thank you for choosing an e-visit.  Your e-visit answers were reviewed by a board certified advanced clinical practitioner to complete your personal care plan. Depending upon the condition, your plan could have included both over the counter or prescription medications.  Please review your pharmacy choice. Make sure the pharmacy is open so you can pick up prescription now. If there is a problem, you may contact your provider through [employer] and have the prescription routed to another pharmacy.  Your safety is important to [place]. If you have drug allergies check your prescription carefully.   For the next 24 hours you can use MyChart to ask questions about today's visit, request a non-urgent call back, or ask for a work or school excuse. You will get an email in the next two days asking about your experience. I hope that your e-visit has been valuable and will speed your recovery.   I have spent 5 minutes in review of e-visit questionnaire,  review and updating patient chart, medical decision making and response to patient.   Mar Daring, PA-C

## 2023-01-06 ENCOUNTER — Other Ambulatory Visit: Payer: Self-pay

## 2023-01-06 MED ORDER — VITAMIN D (ERGOCALCIFEROL) 1.25 MG (50000 UNIT) PO CAPS: 50000.0000 [IU] | ORAL_CAPSULE | ORAL | 0 refills | Status: DC

## 2023-01-06 MED ORDER — PREDNISONE 20 MG PO TABS: 20.0000 mg | ORAL_TABLET | Freq: Two times a day (BID) | ORAL | 0 refills | Status: DC

## 2023-01-07 ENCOUNTER — Ambulatory Visit: Payer: Managed Care, Other (non HMO) | Admitting: Family Medicine

## 2023-01-08 NOTE — Telephone Encounter (Signed)
I reviewed her labs.  Her absolute eosinophil count remains normal but her differential percentage is elevated as it has been before.  I do not think we did treat her empirically with prednisone if she is doing well.  If she is having any signs or symptoms of an acute flare then we can assess, decide whether prednisone would be useful.

## 2023-01-09 ENCOUNTER — Telehealth: Payer: Self-pay | Admitting: Physician Assistant

## 2023-01-09 NOTE — Telephone Encounter (Signed)
Error

## 2023-01-17 ENCOUNTER — Other Ambulatory Visit: Payer: Self-pay | Admitting: Physician Assistant

## 2023-01-19 IMAGING — MR MR CERVICAL SPINE W/O CM
4 of 5 series · 28 of 48 positions shown · non-contrast
Comparison: X-ray 11/06/2021

CLINICAL DATA: Neck pain, chronic spondylosis C6-7 with bilateral
hand numbness

EXAM:
MRI CERVICAL SPINE WITHOUT CONTRAST
TECHNIQUE: Multiplanar, multisequence MR imaging of the cervical spine was
performed. No intravenous contrast was administered.

[Series 2: T2 · sagittal · 3.0mm · 0.66mm/px · 6 of 17 slices shown (1 of 2)]
[im 1/17]
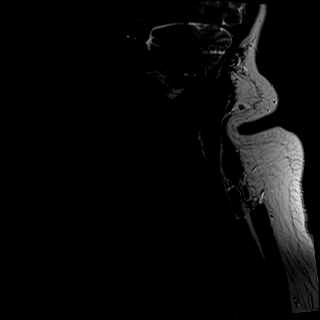
[im 4/17]
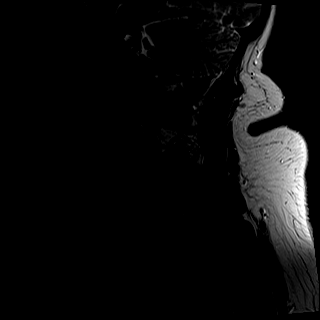
[im 7/17]
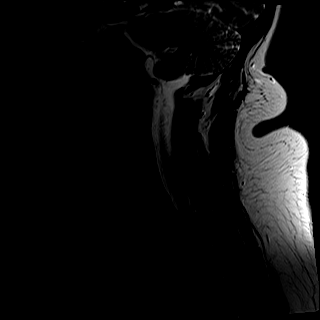
[im 10/17]
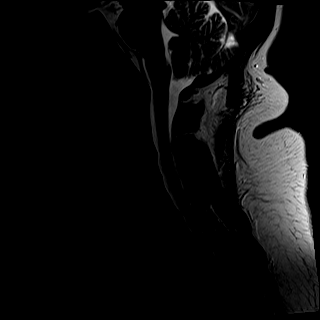
[im 13/17]
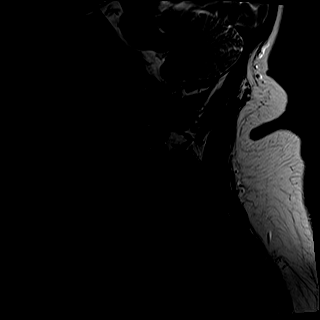
[im 17/17]
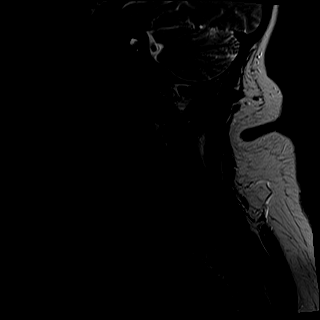

[Series 3: T1 · sagittal · 3.0mm · 0.41mm/px · 7 of 17 slices shown]
[im 1/17]
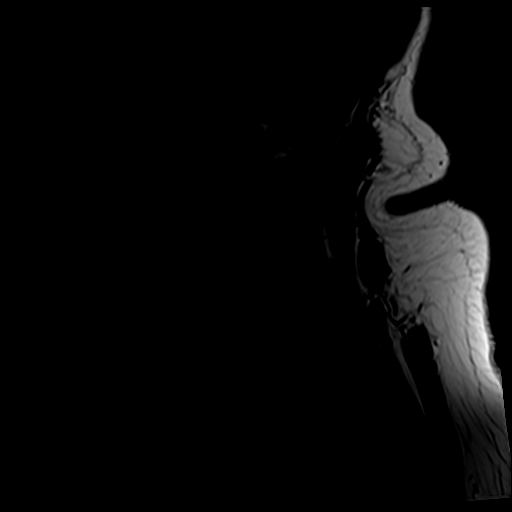
[im 3/17]
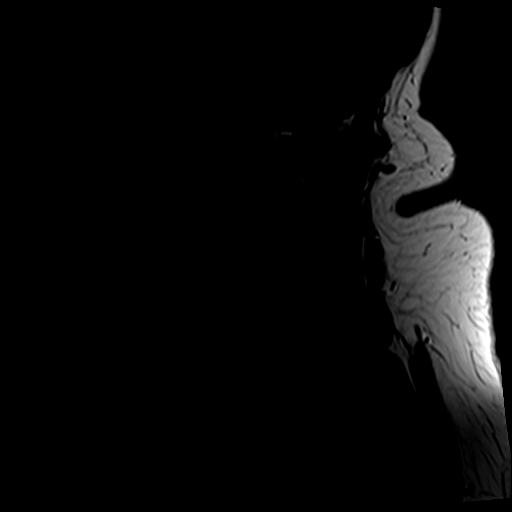
[im 6/17]
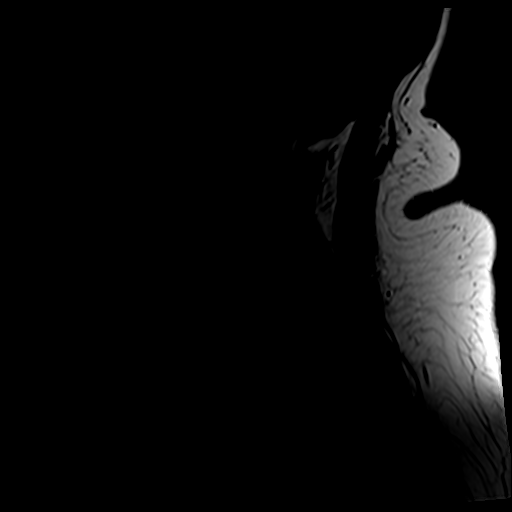
[im 9/17]
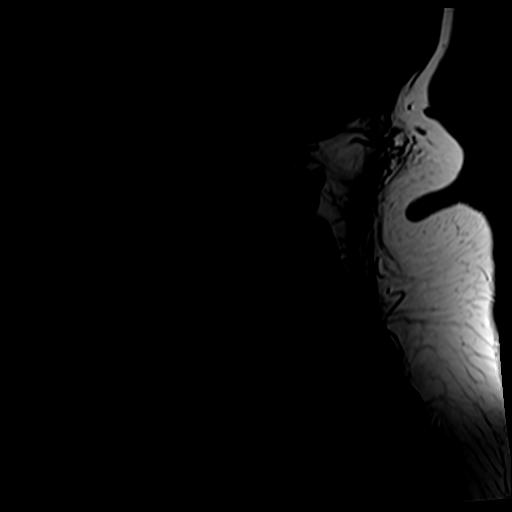
[im 11/17]
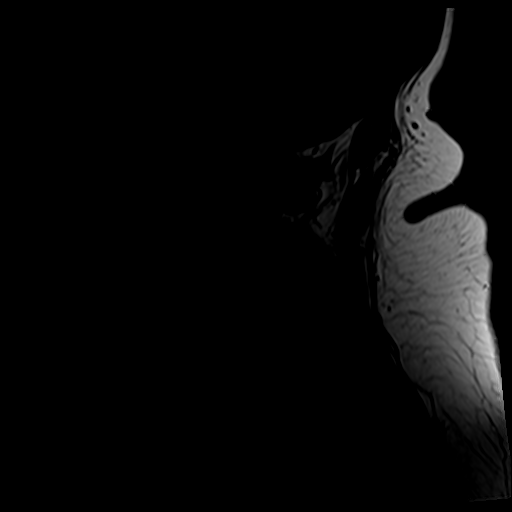
[im 14/17]
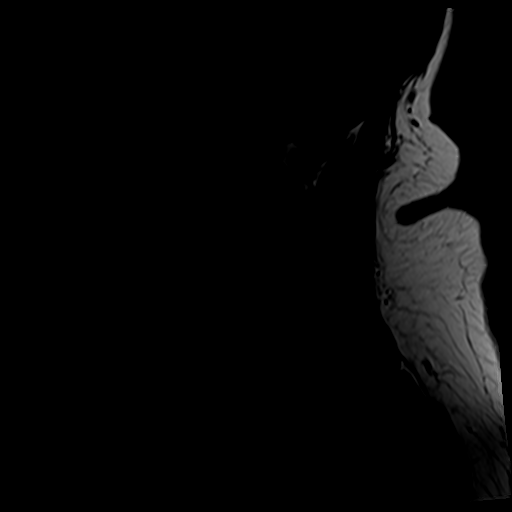
[im 17/17]
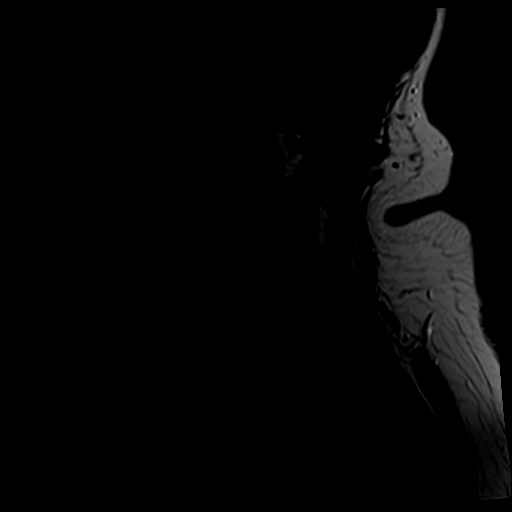

[Series 4: tir sag · sagittal · 3.0mm · 0.41mm/px · 7 of 17 slices shown]
[im 1/17]
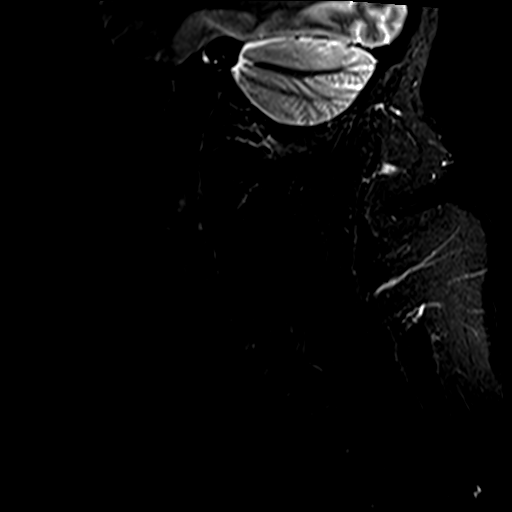
[im 3/17]
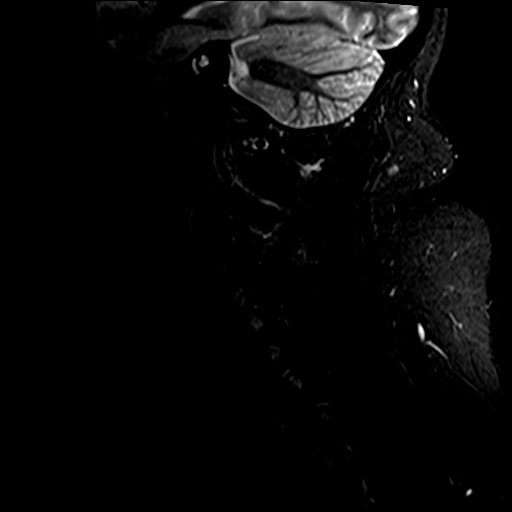
[im 6/17]
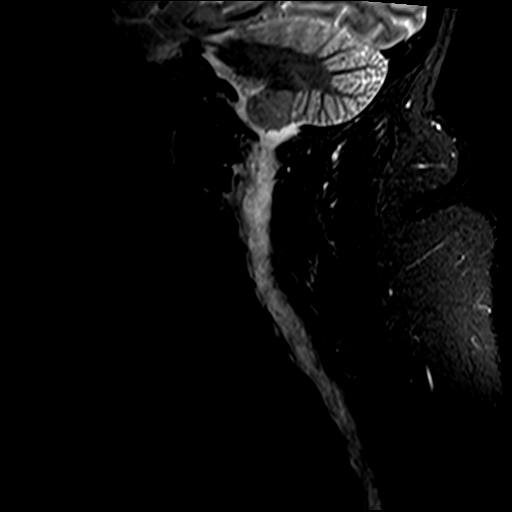
[im 9/17]
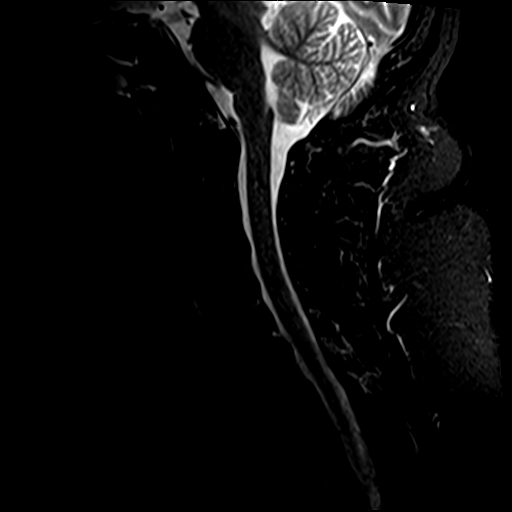
[im 11/17]
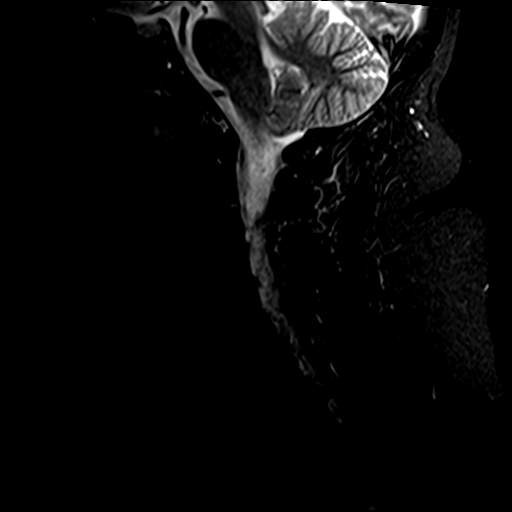
[im 14/17]
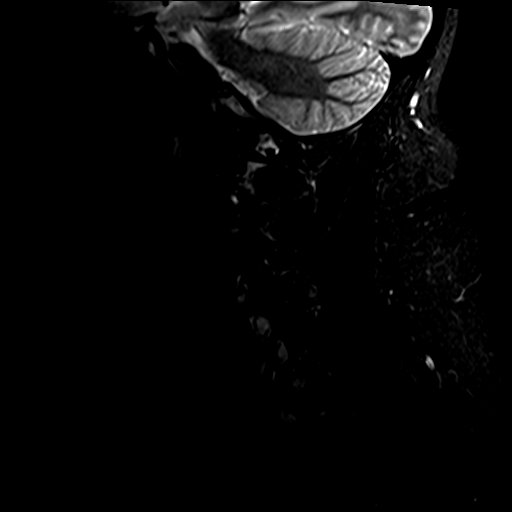
[im 17/17]
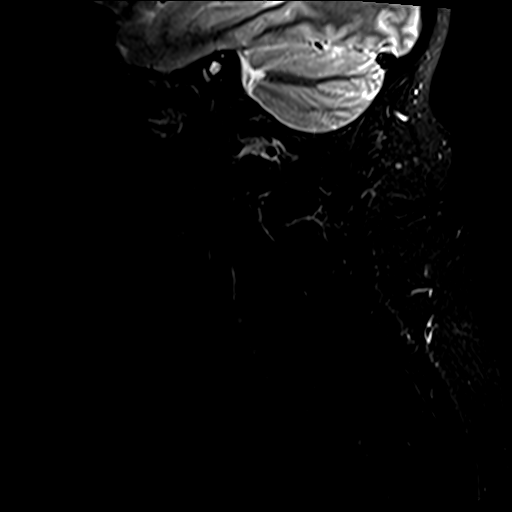

[Series 6: T2 · axial · 3.0mm · 0.70mm/px · z∈[-58,+66]mm · 8 of 35 slices shown (2 of 2)]
[im 1/35]
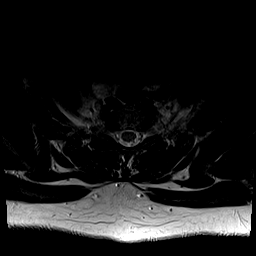
[im 6/35]
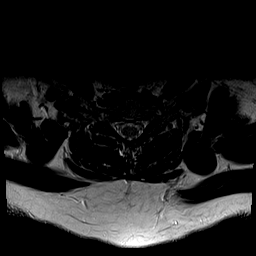
[im 11/35]
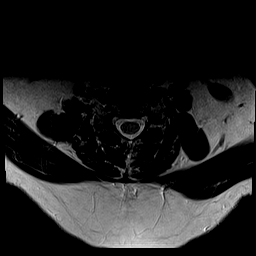
[im 16/35]
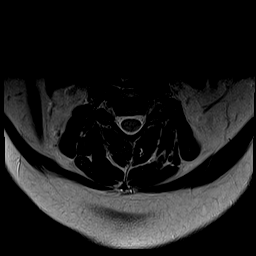
[im 19/35]
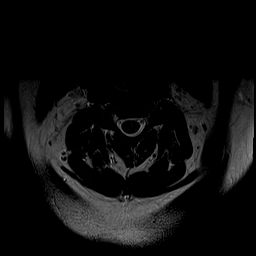
[im 24/35]
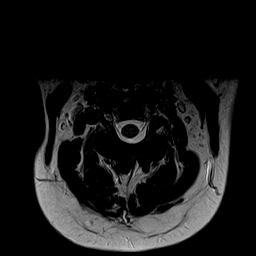
[im 29/35]
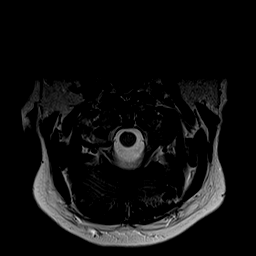
[im 35/35]
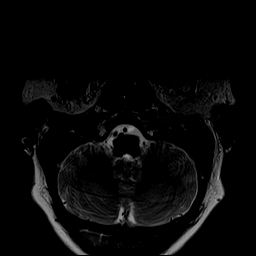

[28 of 48 positions shown; findings below may reference images not displayed]

FINDINGS: Alignment: Minimal grade 1 anterolisthesis C7 on T1.

Vertebrae: No fracture, evidence of discitis, or bone lesion.

Cord: Normal signal and morphology.

Posterior Fossa, vertebral arteries, paraspinal tissues: Negative.

Disc levels:

C2-C3: No significant disc protrusion, foraminal stenosis, or canal
stenosis.

C3-C4: Minimal disc bulge and mild right uncovertebral spurring. No
foraminal or canal stenosis.

C4-C5: Minimal disc bulge with mild bilateral uncovertebral
spurring. No foraminal or canal stenosis.

C5-C6: No significant disc protrusion, foraminal stenosis, or canal
stenosis.

C6-C7: Disc height loss with minimal disc osteophyte complex and
mild right greater than left uncovertebral spurring. Findings result
in mild bilateral foraminal stenosis, right slightly greater than
left. No significant canal stenosis.

C7-T1: Mild anterolisthesis contributes to mild left foraminal
stenosis. No canal stenosis.
IMPRESSION: 1. Mild cervical spondylosis, most pronounced at C6-7 where there is
mild bilateral foraminal stenosis, right slightly greater than left.
2. Mild left foraminal stenosis at C7-T1.
3. No significant canal stenosis of the cervical spine.

## 2023-01-21 ENCOUNTER — Encounter: Payer: Self-pay | Admitting: Physician Assistant

## 2023-01-21 NOTE — Telephone Encounter (Signed)
Note completed for pt and sent through Mychart as requested by patient. Patient advised she is doing well and doing well with seeing therapist. Will work a few weeks and schedule a follow up with PCP soon.

## 2023-01-25 ENCOUNTER — Other Ambulatory Visit: Payer: Self-pay | Admitting: Physician Assistant

## 2023-01-29 NOTE — Telephone Encounter (Signed)
Please see pt message and advise 

## 2023-01-30 ENCOUNTER — Other Ambulatory Visit: Payer: Self-pay | Admitting: Emergency Medicine

## 2023-01-30 ENCOUNTER — Encounter: Payer: Self-pay | Admitting: Physician Assistant

## 2023-02-06 ENCOUNTER — Telehealth: Payer: Managed Care, Other (non HMO) | Admitting: Physician Assistant

## 2023-02-06 DIAGNOSIS — J4541 Moderate persistent asthma with (acute) exacerbation: Secondary | ICD-10-CM | POA: Diagnosis not present

## 2023-02-06 MED ORDER — PREDNISONE 20 MG PO TABS: 40.0000 mg | ORAL_TABLET | Freq: Every day | ORAL | 0 refills | Status: DC

## 2023-02-06 NOTE — Progress Notes (Signed)
Visit for Asthma  Based on what you have shared with me, it looks like you may have a flare up of your asthma.  Asthma is a chronic (ongoing) lung disease which results in airway obstruction, inflammation and hyper-responsiveness.   Asthma symptoms vary from person to person, with common symptoms including nighttime awakening and decreased ability to participate in normal activities as a result of shortness of breath. It is often triggered by changes in weather, changes in the season, changes in air temperature, or inside (home, school, daycare or work) allergens such as animal dander, mold, mildew, woodstoves or cockroaches.   It can also be triggered by hormonal changes, extreme emotion, physical exertion or an upper respiratory tract illness.     It is important to identify the trigger, and then eliminate or avoid the trigger if possible.   If you have been prescribed medications to be taken on a regular basis, it is important to follow the asthma action plan and to follow guidelines to adjust medication in response to increasing symptoms of decreased peak expiratory flow rate  Treatment: I have prescribed: Prednisone 10m by mouth per day for 5 - 7 days  HOME CARE Only take medications as instructed by your medical team. Consider wearing a mask or scarf to improve breathing air temperature have been shown to decrease irritation and decrease exacerbations Get rest. Taking a steamy shower or using a humidifier may help nasal congestion sand ease sore throat pain. You can place a towel over your head and breathe in the steam from hot water coming from a faucet. Using a saline nasal spray works much the same way.  Cough drops, hare candies and sore throat lozenges may ease your cough.  Avoid close contacts especially the very you and the elderly Cover your mouth if you cough or  sneeze Always remember to wash your hands.    GET HELP RIGHT AWAY IF: You develop worsening symptoms; breathlessness at rest, drowsy, confused or agitated, unable to speak in full sentences You have coughing fits You develop a severe headache or visual changes You develop shortness of breath, difficulty breathing or start having chest pain Your symptoms persist after you have completed your treatment plan If your symptoms do not improve within 10 days  MAKE SURE YOU Understand these instructions. Will watch your condition. Will get help right away if you are not doing well or get worse.   Your e-visit answers were reviewed by a board certified advanced clinical practitioner to complete your personal care plan, Depending upon the condition, your plan could have included both over the counter or prescription medications.   Please review your pharmacy choice. Your safety is important to [place] If you have drug allergies check your prescription carefully.  You can use MyChart to ask questions about today's visit, request a non-urgent  call back, or ask for a work or school excuse for 24 hours related to this e-Visit. If it has been greater than 24 hours you will need to follow up with your provider, or enter a new e-Visit to address those concerns.   You will get an e-mail in the next two days asking about your experience. I hope that your e-visit has been valuable and will speed your recovery. Thank you for using e-visits.

## 2023-02-06 NOTE — Progress Notes (Signed)
I have spent 5 minutes in review of e-visit questionnaire, review and updating patient chart, medical decision making and response to patient.   Benita Boonstra Cody Gelisa Tieken, PA-C    

## 2023-02-10 ENCOUNTER — Other Ambulatory Visit: Payer: Self-pay | Admitting: *Deleted

## 2023-02-10 MED ORDER — ALBUTEROL SULFATE HFA 108 (90 BASE) MCG/ACT IN AERS: 2.0000 | INHALATION_SPRAY | Freq: Four times a day (QID) | RESPIRATORY_TRACT | 3 refills | Status: DC | PRN

## 2023-02-10 MED ORDER — BUDESONIDE-FORMOTEROL FUMARATE 80-4.5 MCG/ACT IN AERO: 2.0000 | INHALATION_SPRAY | Freq: Two times a day (BID) | RESPIRATORY_TRACT | 3 refills | Status: DC

## 2023-02-11 ENCOUNTER — Other Ambulatory Visit: Payer: Self-pay | Admitting: Emergency Medicine

## 2023-02-13 ENCOUNTER — Other Ambulatory Visit (HOSPITAL_COMMUNITY): Payer: Self-pay

## 2023-02-13 ENCOUNTER — Telehealth: Payer: Self-pay

## 2023-02-13 NOTE — Telephone Encounter (Signed)
PA request received via [facility] for Albuterol Sulfate HFA 108 (90 Base)MCG/ACT aerosol  PA not submitted at this time due to test claim showing that the 8.5 gram HFA is preferred.   Key: [id number]

## 2023-02-14 MED ORDER — ALBUTEROL SULFATE HFA 108 (90 BASE) MCG/ACT IN AERS: 2.0000 | INHALATION_SPRAY | Freq: Four times a day (QID) | RESPIRATORY_TRACT | 2 refills | Status: DC | PRN

## 2023-02-14 NOTE — Telephone Encounter (Signed)
Yes ok w me

## 2023-02-14 NOTE — Telephone Encounter (Signed)
Dr Lamonte Sakai,  Please note preference for patients insurance and let us know if this is approved by you  Thank you

## 2023-02-14 NOTE — Addendum Note (Signed)
Addended by: Gavin Potters R on: 02/14/2023 02:30 PM   Modules accepted: Orders

## 2023-02-14 NOTE — Telephone Encounter (Signed)
Albuterol HFA inhaler order placed. Nothing further needed at this time.

## 2023-02-21 ENCOUNTER — Encounter: Payer: Self-pay | Admitting: Physician Assistant

## 2023-02-23 DIAGNOSIS — S82141A Displaced bicondylar fracture of right tibia, initial encounter for closed fracture: Secondary | ICD-10-CM | POA: Insufficient documentation

## 2023-02-24 DIAGNOSIS — S82209A Unspecified fracture of shaft of unspecified tibia, initial encounter for closed fracture: Secondary | ICD-10-CM | POA: Insufficient documentation

## 2023-02-24 DIAGNOSIS — E669 Obesity, unspecified: Secondary | ICD-10-CM | POA: Insufficient documentation

## 2023-02-26 DIAGNOSIS — S82141A Displaced bicondylar fracture of right tibia, initial encounter for closed fracture: Secondary | ICD-10-CM | POA: Insufficient documentation

## 2023-02-28 DIAGNOSIS — S83269A Peripheral tear of lateral meniscus, current injury, unspecified knee, initial encounter: Secondary | ICD-10-CM | POA: Insufficient documentation

## 2023-03-06 ENCOUNTER — Other Ambulatory Visit: Payer: Self-pay | Admitting: Physician Assistant

## 2023-03-07 ENCOUNTER — Other Ambulatory Visit: Payer: Self-pay | Admitting: Physician Assistant

## 2023-03-07 NOTE — Telephone Encounter (Signed)
6 months
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
## 2023-03-07 NOTE — Telephone Encounter (Signed)
Handicap placecard completed. Called patient to advise and she requested that we mail to her since she is still in inpatient therapy at this time. Advised I would place in today's out going mail for her.

## 2023-03-07 NOTE — Telephone Encounter (Signed)
Please see pt response and advise if ok for Handicap placecard

## 2023-03-09 ENCOUNTER — Encounter: Payer: Self-pay | Admitting: Physician Assistant

## 2023-03-25 ENCOUNTER — Telehealth: Payer: Medicaid Other | Admitting: Physician Assistant

## 2023-03-25 DIAGNOSIS — J4541 Moderate persistent asthma with (acute) exacerbation: Secondary | ICD-10-CM | POA: Diagnosis not present

## 2023-03-25 MED ORDER — PREDNISONE 20 MG PO TABS: 40.0000 mg | ORAL_TABLET | Freq: Every day | ORAL | 0 refills | Status: DC

## 2023-03-25 NOTE — Progress Notes (Signed)
E-Visit for Asthma  Based on what you have shared with me, it looks like you may have a flare up of your asthma.  Asthma is a chronic (ongoing) lung disease which results in airway obstruction, inflammation and hyper-responsiveness.   Asthma symptoms vary from person to person, with common symptoms including nighttime awakening and decreased ability to participate in normal activities as a result of shortness of breath. It is often triggered by changes in weather, changes in the season, changes in air temperature, or inside (home, school, daycare or work) allergens such as animal dander, mold, mildew, woodstoves or cockroaches.   It can also be triggered by hormonal changes, extreme emotion, physical exertion or an upper respiratory tract illness.     It is important to identify the trigger, and then eliminate or avoid the trigger if possible.   If you have been prescribed medications to be taken on a regular basis, it is important to follow the asthma action plan and to follow guidelines to adjust medication in response to increasing symptoms of decreased peak expiratory flow rate  Treatment: I have prescribed: Prednisone 40mg by mouth per day for 5 - 7 days  HOME CARE Only take medications as instructed by your medical team. Consider wearing a mask or scarf to improve breathing air temperature have been shown to decrease irritation and decrease exacerbations Get rest. Taking a steamy shower or using a humidifier may help nasal congestion sand ease sore throat pain. You can place a towel over your head and breathe in the steam from hot water coming from a faucet. Using a saline nasal spray works much the same way.  Cough drops, hare candies and sore throat lozenges may ease your cough.  Avoid close contacts especially the very you and the elderly Cover your mouth if you cough or  sneeze Always remember to wash your hands.    GET HELP RIGHT AWAY IF: You develop worsening symptoms; breathlessness at rest, drowsy, confused or agitated, unable to speak in full sentences You have coughing fits You develop a severe headache or visual changes You develop shortness of breath, difficulty breathing or start having chest pain Your symptoms persist after you have completed your treatment plan If your symptoms do not improve within 10 days  MAKE SURE YOU Understand these instructions. Will watch your condition. Will get help right away if you are not doing well or get worse.   Your e-visit answers were reviewed by a board certified advanced clinical practitioner to complete your personal care plan, Depending upon the condition, your plan could have included both over the counter or prescription medications.   Please review your pharmacy choice. Your safety is important to us. If you have drug allergies check your prescription carefully.  You can use MyChart to ask questions about today's visit, request a non-urgent  call back, or ask for a work or school excuse for 24 hours related to this e-Visit. If it has been greater than 24 hours you will need to follow up with your provider, or enter a new e-Visit to address those concerns.   You will get an e-mail in the next two days asking about your experience. I hope that your e-visit has been valuable and will speed your recovery. Thank you for using e-visits.  I have spent 5 minutes in review of e-visit questionnaire, review and updating patient chart, medical decision making and response to patient.   Katrina Roman M Katrina Bowler, PA-C  

## 2023-04-22 ENCOUNTER — Telehealth: Payer: Medicaid Other | Admitting: Physician Assistant

## 2023-04-22 DIAGNOSIS — M545 Low back pain, unspecified: Secondary | ICD-10-CM | POA: Diagnosis not present

## 2023-04-22 MED ORDER — TIZANIDINE HCL 2 MG PO TABS: 2.0000 mg | ORAL_TABLET | Freq: Four times a day (QID) | ORAL | 0 refills | Status: DC | PRN

## 2023-04-22 MED ORDER — PREDNISONE 10 MG (21) PO TBPK: ORAL_TABLET | ORAL | 0 refills | Status: DC

## 2023-04-22 NOTE — Progress Notes (Signed)
I have spent 5 minutes in review of e-visit questionnaire, review and updating patient chart, medical decision making and response to patient.   Swetha Rayle Cody Philopater Mucha, PA-C    

## 2023-04-22 NOTE — Progress Notes (Signed)

## 2023-04-23 ENCOUNTER — Other Ambulatory Visit: Payer: Self-pay

## 2023-04-23 MED ORDER — ESCITALOPRAM OXALATE 20 MG PO TABS: 20.0000 mg | ORAL_TABLET | Freq: Every day | ORAL | 0 refills | Status: DC

## 2023-04-23 MED ORDER — BUPROPION HCL ER (XL) 300 MG PO TB24: 300.0000 mg | ORAL_TABLET | Freq: Every day | ORAL | 1 refills | Status: DC

## 2023-06-14 ENCOUNTER — Encounter: Payer: Self-pay | Admitting: Physician Assistant

## 2023-06-16 NOTE — Telephone Encounter (Signed)
Please see pt msg and advise 

## 2023-06-17 ENCOUNTER — Other Ambulatory Visit: Payer: Self-pay | Admitting: Physician Assistant

## 2023-06-18 ENCOUNTER — Telehealth: Payer: Medicaid Other | Admitting: Physician Assistant

## 2023-06-18 DIAGNOSIS — B9789 Other viral agents as the cause of diseases classified elsewhere: Secondary | ICD-10-CM | POA: Diagnosis not present

## 2023-06-18 DIAGNOSIS — J019 Acute sinusitis, unspecified: Secondary | ICD-10-CM

## 2023-06-18 MED ORDER — PREDNISONE 10 MG (21) PO TBPK
ORAL_TABLET | ORAL | 0 refills | Status: DC
Start: 2023-06-18 — End: 2023-07-22

## 2023-06-18 NOTE — Progress Notes (Signed)
E-Visit for Sinus Problems  We are sorry that you are not feeling well.  Here is how we plan to help!  Based on what you have shared with me it looks like you have sinusitis.  Sinusitis is inflammation and infection in the sinus cavities of the head.  Based on your presentation I believe you most likely have Acute Viral Sinusitis.This is an infection most likely caused by a virus. There is not specific treatment for viral sinusitis other than to help you with the symptoms until the infection runs its course.  You may use an oral decongestant such as Mucinex D or if you have glaucoma or high blood pressure use plain Mucinex. Saline nasal spray help and can safely be used as often as needed for congestion, I have prescribed: a prednisone pack to take as directed.   Some authorities believe that zinc sprays or the use of Echinacea may shorten the course of your symptoms.  Sinus infections are not as easily transmitted as other respiratory infection, however we still recommend that you avoid close contact with loved ones, especially the very young and elderly.  Remember to wash your hands thoroughly throughout the day as this is the number one way to prevent the spread of infection!  Home Care: Only take medications as instructed by your medical team. Do not take these medications with alcohol. A steam or ultrasonic humidifier can help congestion.  You can place a towel over your head and breathe in the steam from hot water coming from a faucet. Avoid close contacts especially the very young and the elderly. Cover your mouth when you cough or sneeze. Always remember to wash your hands.  Get Help Right Away If: You develop worsening fever or sinus pain. You develop a severe head ache or visual changes. Your symptoms persist after you have completed your treatment plan.  Make sure you Understand these instructions. Will watch your condition. Will get help right away if you are not doing well or  get worse.   Thank you for choosing an e-visit.  Your e-visit answers were reviewed by a board certified advanced clinical practitioner to complete your personal care plan. Depending upon the condition, your plan could have included both over the counter or prescription medications.  Please review your pharmacy choice. Make sure the pharmacy is open so you can pick up prescription now. If there is a problem, you may contact your provider through MyChart messaging and have the prescription routed to another pharmacy.  Your safety is important to us. If you have drug allergies check your prescription carefully.   For the next 24 hours you can use MyChart to ask questions about today's visit, request a non-urgent call back, or ask for a work or school excuse. You will get an email in the next two days asking about your experience. I hope that your e-visit has been valuable and will speed your recovery.   

## 2023-06-18 NOTE — Progress Notes (Signed)
I have spent 5 minutes in review of e-visit questionnaire, review and updating patient chart, medical decision making and response to patient.   Love Milbourne Cody Calleen Alvis, PA-C    

## 2023-06-22 ENCOUNTER — Other Ambulatory Visit: Payer: Self-pay | Admitting: Physician Assistant

## 2023-07-21 ENCOUNTER — Telehealth: Payer: Medicaid Other | Admitting: Physician Assistant

## 2023-07-21 DIAGNOSIS — B9789 Other viral agents as the cause of diseases classified elsewhere: Secondary | ICD-10-CM

## 2023-07-21 DIAGNOSIS — J019 Acute sinusitis, unspecified: Secondary | ICD-10-CM

## 2023-07-22 MED ORDER — PREDNISONE 10 MG (21) PO TBPK
ORAL_TABLET | ORAL | 0 refills | Status: DC
Start: 2023-07-22 — End: 2023-10-08

## 2023-07-22 NOTE — Progress Notes (Signed)
E-Visit for Sinus Problems  We are sorry that you are not feeling well.  Here is how we plan to help!  Based on what you have shared with me it looks like you have sinusitis.  Sinusitis is inflammation and infection in the sinus cavities of the head.  Based on your presentation I believe you most likely have Acute Viral Sinusitis.This is an infection most likely caused by a virus. There is not specific treatment for viral sinusitis other than to help you with the symptoms until the infection runs its course.  You may use an oral decongestant such as Mucinex D or if you have glaucoma or high blood pressure use plain Mucinex. Saline nasal spray help and can safely be used as often as needed for congestion, I have prescribed: a prednisone taper to reduce sinus inflammation and help breathing.  Some authorities believe that zinc sprays or the use of Echinacea may shorten the course of your symptoms.  Sinus infections are not as easily transmitted as other respiratory infection, however we still recommend that you avoid close contact with loved ones, especially the very young and elderly.  Remember to wash your hands thoroughly throughout the day as this is the number one way to prevent the spread of infection!  Home Care: Only take medications as instructed by your medical team. Do not take these medications with alcohol. A steam or ultrasonic humidifier can help congestion.  You can place a towel over your head and breathe in the steam from hot water coming from a faucet. Avoid close contacts especially the very young and the elderly. Cover your mouth when you cough or sneeze. Always remember to wash your hands.  Get Help Right Away If: You develop worsening fever or sinus pain. You develop a severe head ache or visual changes. Your symptoms persist after you have completed your treatment plan.  Make sure you Understand these instructions. Will watch your condition. Will get help right away if  you are not doing well or get worse.   Thank you for choosing an e-visit.  Your e-visit answers were reviewed by a board certified advanced clinical practitioner to complete your personal care plan. Depending upon the condition, your plan could have included both over the counter or prescription medications.  Please review your pharmacy choice. Make sure the pharmacy is open so you can pick up prescription now. If there is a problem, you may contact your provider through [employer] and have the prescription routed to another pharmacy.  Your safety is important to [place]. If you have drug allergies check your prescription carefully.   For the next 24 hours you can use MyChart to ask questions about today's visit, request a non-urgent call back, or ask for a work or school excuse. You will get an email in the next two days asking about your experience. I hope that your e-visit has been valuable and will speed your recovery.

## 2023-07-22 NOTE — Progress Notes (Signed)
I have spent 5 minutes in review of e-visit questionnaire, review and updating patient chart, medical decision making and response to patient.   Yovan Leeman Cody Philena Obey, PA-C    

## 2023-07-25 DIAGNOSIS — M79661 Pain in right lower leg: Secondary | ICD-10-CM | POA: Insufficient documentation

## 2023-08-12 ENCOUNTER — Telehealth: Payer: Medicaid Other | Admitting: Physician Assistant

## 2023-08-12 DIAGNOSIS — J310 Chronic rhinitis: Secondary | ICD-10-CM

## 2023-08-12 DIAGNOSIS — J4540 Moderate persistent asthma, uncomplicated: Secondary | ICD-10-CM | POA: Diagnosis not present

## 2023-08-12 NOTE — Progress Notes (Signed)
E-Visit for Asthma  Based on what you have shared with me, it looks like you may have a flare up of your asthma.  Asthma is a chronic (ongoing) lung disease which results in airway obstruction, inflammation and hyper-responsiveness.   Asthma symptoms vary from person to person, with common symptoms including nighttime awakening and decreased ability to participate in normal activities as a result of shortness of breath. It is often triggered by changes in weather, changes in the season, changes in air temperature, or inside (home, school, daycare or work) allergens such as animal dander, mold, mildew, woodstoves or cockroaches.   It can also be triggered by hormonal changes, extreme emotion, physical exertion or an upper respiratory tract illness.     It is important to identify the trigger, and then eliminate or avoid the trigger if possible.   If you have been prescribed medications to be taken on a regular basis, it is important to follow the asthma action plan and to follow guidelines to adjust medication in response to increasing symptoms of decreased peak expiratory flow rate  Treatment: I have prescribed: Prednisone 40mg  by mouth per day for 5 - 7 days. Stop the Xyzal and start OTC Allegra daily. You need to schedule a follow-up with your primary care provider for further management of worsening allergy symptoms.   HOME CARE Only take medications as instructed by your medical team. Consider wearing a mask or scarf to improve breathing air temperature have been shown to decrease irritation and decrease exacerbations Get rest. Taking a steamy shower or using a humidifier may help nasal congestion sand ease sore throat pain. You can place a towel over your head and breathe in the steam from hot water coming from a faucet. Using a saline nasal spray works much the same way.  Cough  drops, hare candies and sore throat lozenges may ease your cough.  Avoid close contacts especially the very you and the elderly Cover your mouth if you cough or sneeze Always remember to wash your hands.    GET HELP RIGHT AWAY IF: You develop worsening symptoms; breathlessness at rest, drowsy, confused or agitated, unable to speak in full sentences You have coughing fits You develop a severe headache or visual changes You develop shortness of breath, difficulty breathing or start having chest pain Your symptoms persist after you have completed your treatment plan If your symptoms do not improve within 10 days  MAKE SURE YOU Understand these instructions. Will watch your condition. Will get help right away if you are not doing well or get worse.   Your e-visit answers were reviewed by a board certified advanced clinical practitioner to complete your personal care plan, Depending upon the condition, your plan could have included both over the counter or prescription medications.   Please review your pharmacy choice. Your safety is important to [place]. If you have drug allergies check your prescription carefully.  You can use MyChart to ask questions about today's visit, request a non-urgent  call back, or ask for a work or school excuse for 24 hours related to this e-Visit. If it has been greater than 24 hours you will need to follow up with your provider, or enter a new e-Visit to address those concerns.   You will get an e-mail in the next two days asking about your experience. I hope that your e-visit has been valuable and will speed your recovery. Thank you for using e-visits.

## 2023-08-12 NOTE — Progress Notes (Signed)
I have spent 5 minutes in review of e-visit questionnaire, review and updating patient chart, medical decision making and response to patient.   William Cody Martin, PA-C    

## 2023-09-08 ENCOUNTER — Encounter: Payer: Self-pay | Admitting: Emergency Medicine

## 2023-09-11 ENCOUNTER — Encounter: Payer: Self-pay | Admitting: Physician Assistant

## 2023-09-11 ENCOUNTER — Other Ambulatory Visit: Payer: Self-pay

## 2023-09-11 MED ORDER — PREDNISONE 20 MG PO TABS: 20.0000 mg | ORAL_TABLET | Freq: Two times a day (BID) | ORAL | 0 refills | Status: DC

## 2023-09-11 NOTE — Telephone Encounter (Signed)
Rx sent to pharmacy   

## 2023-09-12 ENCOUNTER — Other Ambulatory Visit: Payer: Self-pay

## 2023-09-12 MED ORDER — PREDNISONE 20 MG PO TABS: 20.0000 mg | ORAL_TABLET | Freq: Two times a day (BID) | ORAL | 0 refills | Status: DC

## 2023-09-18 ENCOUNTER — Other Ambulatory Visit: Payer: Self-pay | Admitting: Emergency Medicine

## 2023-09-29 ENCOUNTER — Other Ambulatory Visit: Payer: Self-pay | Admitting: Emergency Medicine

## 2023-10-02 ENCOUNTER — Other Ambulatory Visit: Payer: Self-pay | Admitting: Physician Assistant

## 2023-10-02 ENCOUNTER — Telehealth: Payer: Medicaid Other | Admitting: Physician Assistant

## 2023-10-02 DIAGNOSIS — J069 Acute upper respiratory infection, unspecified: Secondary | ICD-10-CM

## 2023-10-02 MED ORDER — BENZONATATE 100 MG PO CAPS: 100.0000 mg | ORAL_CAPSULE | Freq: Two times a day (BID) | ORAL | 0 refills | Status: DC | PRN

## 2023-10-02 MED ORDER — FLUTICASONE PROPIONATE 50 MCG/ACT NA SUSP: 2.0000 | Freq: Every day | NASAL | 6 refills | Status: DC

## 2023-10-02 MED ORDER — AZELASTINE-FLUTICASONE 137-50 MCG/ACT NA SUSP
1.0000 | Freq: Two times a day (BID) | NASAL | 0 refills | Status: DC
Start: 2023-10-02 — End: 2024-08-04

## 2023-10-02 NOTE — Addendum Note (Signed)
Addended byLaure Kidney on: 10/02/2023 05:25 PM   Modules accepted: Orders

## 2023-10-02 NOTE — Progress Notes (Signed)
I have spent 5 minutes in review of e-visit questionnaire, review and updating patient chart, medical decision making and response to patient.   Laure Kidney, PA-C

## 2023-10-02 NOTE — Progress Notes (Addendum)

## 2023-10-07 ENCOUNTER — Telehealth: Payer: Medicaid Other | Admitting: Emergency Medicine

## 2023-10-07 DIAGNOSIS — J019 Acute sinusitis, unspecified: Secondary | ICD-10-CM

## 2023-10-07 DIAGNOSIS — J45901 Unspecified asthma with (acute) exacerbation: Secondary | ICD-10-CM

## 2023-10-07 DIAGNOSIS — B9689 Other specified bacterial agents as the cause of diseases classified elsewhere: Secondary | ICD-10-CM

## 2023-10-07 MED ORDER — ALBUTEROL SULFATE (2.5 MG/3ML) 0.083% IN NEBU: 2.5000 mg | INHALATION_SOLUTION | Freq: Four times a day (QID) | RESPIRATORY_TRACT | 1 refills | Status: DC | PRN

## 2023-10-07 MED ORDER — AMOXICILLIN-POT CLAVULANATE 875-125 MG PO TABS: 1.0000 | ORAL_TABLET | Freq: Two times a day (BID) | ORAL | 0 refills | Status: AC

## 2023-10-07 NOTE — Progress Notes (Signed)
Virtual Visit Consent   Katrina Roman, you are scheduled for a virtual visit with a [facility] provider today. Just as with appointments in the office, your consent must be obtained to participate. Your consent will be active for this visit and any virtual visit you may have with one of our providers in the next 365 days. If you have a MyChart account, a copy of this consent can be sent to you electronically.  As this is a virtual visit, video technology does not allow for your provider to perform a traditional examination. This may limit your provider's ability to fully assess your condition. If your provider identifies any concerns that need to be evaluated in person or the need to arrange testing (such as labs, EKG, etc.), we will make arrangements to do so. Although advances in technology are sophisticated, we cannot ensure that it will always work on either your end or our end. If the connection with a video visit is poor, the visit may have to be switched to a telephone visit. With either a video or telephone visit, we are not always able to ensure that we have a secure connection.  By engaging in this virtual visit, you consent to the provision of healthcare and authorize for your insurance to be billed (if applicable) for the services provided during this visit. Depending on your insurance coverage, you may receive a charge related to this service.  I need to obtain your verbal consent now. Are you willing to proceed with your visit today? Katrina Roman has provided verbal consent on 10/07/2023 for a virtual visit (video or telephone). Cathlyn Parsons, NP  Date: 10/07/2023 6:04 PM  Virtual Visit via Video Note   I, Cathlyn Parsons, connected with  Katrina Roman  (47, May 31, 1976) on 10/07/23 at  5:45 PM EDT by a video-enabled telemedicine application and verified that I am speaking with the correct person using two identifiers.  Location: Patient: Virtual Visit Location Patient:  Home Provider: Virtual Visit Location Provider: Home Office   I discussed the limitations of evaluation and management by telemedicine and the availability of in person appointments. The patient expressed understanding and agreed to proceed.    History of Present Illness: Katrina Roman is a 47 y.o. who identifies as a female who was assigned female at birth, and is being seen today for sinus infection. Has been sick for 8 days. Started as nasal congestion that was clear. Now has thick green "chunks" and blood coming from nose with some blood today. Typically gets a sinus infection every fall and believes she has one now. Does have asthma and up until today has not had flare of asthma symptoms with this illness. Today has wheezing that is mild. Requests rx for nebulizer albuterol as she feels this works better for her. Denies coughing or sputum production. Denies fever. Uses azelastine/fluticasone daily. Has not been using neti pot, rarely uses nasal saline spray.   HPI: HPI  Problems:  Patient Active Problem List   Diagnosis Date Noted   Anxiety and depression 12/31/2022   Other fatigue 12/31/2022   Elevated LDL cholesterol level 12/31/2022   Hyperglycemia 12/31/2022   Lateral epicondylitis, left elbow 02/12/2022   Bilateral hand numbness 12/19/2021   Other spondylosis with radiculopathy, cervical region 12/19/2021   Upper airway resistance syndrome 01/02/2021   Chronic rhinitis 10/25/2020   Nasal polyp 10/25/2020   Asthma 10/25/2020   Heartburn 10/25/2020   Drug reaction 10/25/2020   OSA (  obstructive sleep apnea) 10/05/2020   Allergic rhinitis 10/04/2015   Intrinsic asthma 05/17/2014   Cough 05/17/2014   Eosinophilia 01/15/2011   Peptic ulcer 01/15/2011   GERD 07/31/2010    Allergies:  Allergies  Allergen Reactions   Aspirin     REACTION: affects asthma   Cefdinir     Severe stomach cramps per patient, no nausea/vomiting   Cortisone     Swelling, flushing,itching    Medications:  Current Outpatient Medications:    albuterol (PROVENTIL) (2.5 MG/3ML) 0.083% nebulizer solution, Take 3 mLs (2.5 mg total) by nebulization every 6 (six) hours as needed for wheezing or shortness of breath., Disp: 150 mL, Rfl: 1   amoxicillin-clavulanate (AUGMENTIN) 875-125 MG tablet, Take 1 tablet by mouth 2 (two) times daily for 7 days., Disp: 14 tablet, Rfl: 0   Azelastine-Fluticasone 137-50 MCG/ACT SUSP, Place 1 spray into the nose every 12 (twelve) hours., Disp: 23 g, Rfl: 0   benzonatate (TESSALON) 100 MG capsule, Take 1 capsule (100 mg total) by mouth 2 (two) times daily as needed for cough., Disp: 20 capsule, Rfl: 0   buPROPion (WELLBUTRIN XL) 300 MG 24 hr tablet, Take 1 tablet (300 mg total) by mouth daily., Disp: 90 tablet, Rfl: 1   escitalopram (LEXAPRO) 20 MG tablet, TAKE 1 TABLET BY MOUTH EVERY DAY, Disp: 90 tablet, Rfl: 0   fluticasone furoate-vilanterol (BREO ELLIPTA) 100-25 MCG/ACT AEPB, Inhale 1 puff into the lungs daily., Disp: 90 each, Rfl: 3   levonorgestrel (MIRENA) 20 MCG/DAY IUD, 1 each by Intrauterine route once., Disp: , Rfl:    loratadine (CLARITIN) 10 MG tablet, Take by mouth., Disp: , Rfl:    pantoprazole (PROTONIX) 40 MG tablet, TAKE 1 TABLET DAILY, Disp: 90 tablet, Rfl: 3   predniSONE (DELTASONE) 20 MG tablet, Take 1 tablet (20 mg total) by mouth 2 (two) times daily with a meal., Disp: 10 tablet, Rfl: 0   predniSONE (STERAPRED UNI-PAK 21 TAB) 10 MG (21) TBPK tablet, Take following package directions, Disp: 21 tablet, Rfl: 0   VENTOLIN HFA 108 (90 Base) MCG/ACT inhaler, Inhale 2 puffs by mouth every 6 hours as needed for wheezing or shortness of breath., Disp: 54 g, Rfl: 2  Observations/Objective: Patient is well-developed, well-nourished in no acute distress.  Resting comfortably  at home.  Head is normocephalic, atraumatic.  No labored breathing. I can hear an occasional faint wheeze Speech is clear and coherent with logical content.  Patient is  alert and oriented at baseline.  Does sound congested on video  Assessment and Plan: 1. Acute bacterial sinusitis  2. Asthma with acute exacerbation, unspecified asthma severity, unspecified whether persistent  Will treat for sinusitis. Discussed supportive care  Follow Up Instructions: I discussed the assessment and treatment plan with the patient. The patient was provided an opportunity to ask questions and all were answered. The patient agreed with the plan and demonstrated an understanding of the instructions.  A copy of instructions were sent to the patient via MyChart unless otherwise noted below.   The patient was advised to call back or seek an in-person evaluation if the symptoms worsen or if the condition fails to improve as anticipated.    Cathlyn Parsons, NP

## 2023-10-07 NOTE — Patient Instructions (Signed)
Elmer Ramp, thank you for joining Cathlyn Parsons, NP for today's virtual visit.  While this provider is not your primary care provider (PCP), if your PCP is located in our provider database this encounter information will be shared with them immediately following your visit.   A [facility] MyChart account gives you access to today's visit and all your visits, tests, and labs performed at [facility] " click here if you don't have a [facility] MyChart account or go to [email or web address]  Consent: (Patient) Katrina Roman provided verbal consent for this virtual visit at the beginning of the encounter.  Current Medications:  Current Outpatient Medications:    albuterol (PROVENTIL) (2.5 MG/3ML) 0.083% nebulizer solution, Take 3 mLs (2.5 mg total) by nebulization every 6 (six) hours as needed for wheezing or shortness of breath., Disp: 150 mL, Rfl: 1   amoxicillin-clavulanate (AUGMENTIN) 875-125 MG tablet, Take 1 tablet by mouth 2 (two) times daily for 7 days., Disp: 14 tablet, Rfl: 0   Azelastine-Fluticasone 137-50 MCG/ACT SUSP, Place 1 spray into the nose every 12 (twelve) hours., Disp: 23 g, Rfl: 0   benzonatate (TESSALON) 100 MG capsule, Take 1 capsule (100 mg total) by mouth 2 (two) times daily as needed for cough., Disp: 20 capsule, Rfl: 0   buPROPion (WELLBUTRIN XL) 300 MG 24 hr tablet, Take 1 tablet (300 mg total) by mouth daily., Disp: 90 tablet, Rfl: 1   escitalopram (LEXAPRO) 20 MG tablet, TAKE 1 TABLET BY MOUTH EVERY DAY, Disp: 90 tablet, Rfl: 0   fluticasone furoate-vilanterol (BREO ELLIPTA) 100-25 MCG/ACT AEPB, Inhale 1 puff into the lungs daily., Disp: 90 each, Rfl: 3   levonorgestrel (MIRENA) 20 MCG/DAY IUD, 1 each by Intrauterine route once., Disp: , Rfl:    loratadine (CLARITIN) 10 MG tablet, Take by mouth., Disp: , Rfl:    pantoprazole (PROTONIX) 40 MG tablet, TAKE 1 TABLET DAILY, Disp: 90 tablet, Rfl: 3   predniSONE (DELTASONE) 20 MG tablet, Take 1 tablet  (20 mg total) by mouth 2 (two) times daily with a meal., Disp: 10 tablet, Rfl: 0   predniSONE (STERAPRED UNI-PAK 21 TAB) 10 MG (21) TBPK tablet, Take following package directions, Disp: 21 tablet, Rfl: 0   VENTOLIN HFA 108 (90 Base) MCG/ACT inhaler, Inhale 2 puffs by mouth every 6 hours as needed for wheezing or shortness of breath., Disp: 54 g, Rfl: 2   Medications ordered in this encounter:  Meds ordered this encounter  Medications   albuterol (PROVENTIL) (2.5 MG/3ML) 0.083% nebulizer solution    Sig: Take 3 mLs (2.5 mg total) by nebulization every 6 (six) hours as needed for wheezing or shortness of breath.    Dispense:  150 mL    Refill:  1   amoxicillin-clavulanate (AUGMENTIN) 875-125 MG tablet    Sig: Take 1 tablet by mouth 2 (two) times daily for 7 days.    Dispense:  14 tablet    Refill:  0     *If you need refills on other medications prior to your next appointment, please contact your pharmacy*  Follow-Up: Call back or seek an in-person evaluation if the symptoms worsen or if the condition fails to improve as anticipated.  [facility] [phone]  Other Instructions Use saline spray several times a day or try using saline irrigation, such as with a neti pot, several times a day while you are sick. Many neti pots come with salt packets premeasured to use to make saline. If you  use your own salt, make sure it is kosher salt or sea salt (don't use table salt as it has iodine in it and you don't need that in your nose). Use distilled water to make saline. If you mix your own saline using your own salt, the recipe is 1/4 teaspoon salt in 1 cup warm water. Using saline irrigation can help prevent and treat sinus infections.  Use Mucinex or generic guaifenesin to help thin out mucus and help it drain.   If albuterol not controlling your wheezing, please be seen in person for asthma help.     If you have been instructed to have an in-person evaluation today at a  local Urgent Care facility, please use the link below. It will take you to a list of all of our available [facility], including address, phone number and hours of operation. Please do not delay care.  [facility]  If you or a family member do not have a primary care provider, use the link below to schedule a visit and establish care. When you choose a [facility] primary care physician or advanced practice provider, you gain a long-term partner in health. Find a Primary Care Provider  Learn more about [facility] in-office and virtual care options: [facility] - Get Care Now

## 2023-10-08 ENCOUNTER — Other Ambulatory Visit: Payer: Self-pay

## 2023-10-08 ENCOUNTER — Encounter: Payer: Self-pay | Admitting: Physician Assistant

## 2023-10-08 ENCOUNTER — Encounter: Payer: Self-pay | Admitting: Emergency Medicine

## 2023-10-08 DIAGNOSIS — B9789 Other viral agents as the cause of diseases classified elsewhere: Secondary | ICD-10-CM

## 2023-10-08 MED ORDER — PANTOPRAZOLE SODIUM 40 MG PO TBEC: 40.0000 mg | DELAYED_RELEASE_TABLET | Freq: Every day | ORAL | 3 refills | Status: DC

## 2023-10-08 MED ORDER — PREDNISONE 10 MG (21) PO TBPK
ORAL_TABLET | ORAL | 0 refills | Status: DC
Start: 2023-10-08 — End: 2023-10-23

## 2023-10-08 MED ORDER — DOXYCYCLINE HYCLATE 100 MG PO TABS: 100.0000 mg | ORAL_TABLET | Freq: Two times a day (BID) | ORAL | 0 refills | Status: AC

## 2023-10-14 ENCOUNTER — Telehealth: Payer: Medicaid Other | Admitting: Physician Assistant

## 2023-10-16 ENCOUNTER — Encounter: Payer: Self-pay | Admitting: Physician Assistant

## 2023-10-23 ENCOUNTER — Encounter: Payer: Self-pay | Admitting: Physician Assistant

## 2023-10-23 ENCOUNTER — Encounter: Payer: Self-pay | Admitting: Emergency Medicine

## 2023-10-23 ENCOUNTER — Ambulatory Visit: Payer: Medicaid Other | Admitting: Physician Assistant

## 2023-10-23 ENCOUNTER — Ambulatory Visit (INDEPENDENT_AMBULATORY_CARE_PROVIDER_SITE_OTHER): Payer: Medicaid Other | Admitting: Emergency Medicine

## 2023-10-23 VITALS — BP 124/84 | HR 97 | Temp 98.5°F | Ht 61.0 in | Wt 252.8 lb

## 2023-10-23 VITALS — BP 124/88 | HR 91 | Temp 97.7°F | Ht 61.0 in | Wt 251.8 lb

## 2023-10-23 DIAGNOSIS — Z0001 Encounter for general adult medical examination with abnormal findings: Secondary | ICD-10-CM | POA: Diagnosis not present

## 2023-10-23 DIAGNOSIS — J309 Allergic rhinitis, unspecified: Secondary | ICD-10-CM | POA: Diagnosis not present

## 2023-10-23 DIAGNOSIS — K219 Gastro-esophageal reflux disease without esophagitis: Secondary | ICD-10-CM

## 2023-10-23 DIAGNOSIS — F419 Anxiety disorder, unspecified: Secondary | ICD-10-CM

## 2023-10-23 DIAGNOSIS — G478 Other sleep disorders: Secondary | ICD-10-CM | POA: Diagnosis not present

## 2023-10-23 DIAGNOSIS — F32A Depression, unspecified: Secondary | ICD-10-CM

## 2023-10-23 DIAGNOSIS — R7303 Prediabetes: Secondary | ICD-10-CM

## 2023-10-23 DIAGNOSIS — J4540 Moderate persistent asthma, uncomplicated: Secondary | ICD-10-CM | POA: Diagnosis not present

## 2023-10-23 DIAGNOSIS — E7800 Pure hypercholesterolemia, unspecified: Secondary | ICD-10-CM

## 2023-10-23 DIAGNOSIS — Z0000 Encounter for general adult medical examination without abnormal findings: Secondary | ICD-10-CM

## 2023-10-23 DIAGNOSIS — M79604 Pain in right leg: Secondary | ICD-10-CM | POA: Diagnosis not present

## 2023-10-23 LAB — COMPREHENSIVE METABOLIC PANEL
ALT: 27 U/L (ref 0–35)
AST: 19 U/L (ref 0–37)
Albumin: 4.2 g/dL (ref 3.5–5.2)
Alkaline Phosphatase: 71 U/L (ref 39–117)
BUN: 14 mg/dL (ref 6–23)
CO2: 23 meq/L (ref 19–32)
Calcium: 9.5 mg/dL (ref 8.4–10.5)
Chloride: 107 meq/L (ref 96–112)
Creatinine, Ser: 0.86 mg/dL (ref 0.40–1.20)
GFR: 80.36 mL/min (ref >60.00)
Glucose, Bld: 99 mg/dL (ref 70–99)
Potassium: 3.8 meq/L (ref 3.5–5.1)
Sodium: 139 meq/L (ref 135–145)
Total Bilirubin: 0.7 mg/dL (ref 0.2–1.2)
Total Protein: 7.1 g/dL (ref 6.0–8.3)

## 2023-10-23 LAB — LIPID PANEL
Cholesterol: 206 mg/dL — ABNORMAL HIGH (ref 0–200)
HDL: 48.6 mg/dL (ref >39.00)
LDL Cholesterol: 120 mg/dL — ABNORMAL HIGH (ref 0–99)
NonHDL: 157.76
Total CHOL/HDL Ratio: 4
Triglycerides: 187 mg/dL — ABNORMAL HIGH (ref 0.0–149.0)
VLDL: 37.4 mg/dL (ref 0.0–40.0)

## 2023-10-23 LAB — CBC WITH DIFFERENTIAL/PLATELET
Basophils Absolute: 0.1 10*3/uL (ref 0.0–0.1)
Basophils Relative: 0.8 % (ref 0.0–3.0)
Eosinophils Absolute: 0.7 10*3/uL (ref 0.0–0.7)
Eosinophils Relative: 6.5 % — ABNORMAL HIGH (ref 0.0–5.0)
HCT: 43.9 % (ref 36.0–46.0)
Hemoglobin: 14.3 g/dL (ref 12.0–15.0)
Lymphocytes Relative: 26 % (ref 12.0–46.0)
Lymphs Abs: 2.7 10*3/uL (ref 0.7–4.0)
MCHC: 32.7 g/dL (ref 30.0–36.0)
MCV: 91.2 fL (ref 78.0–100.0)
Monocytes Absolute: 0.9 10*3/uL (ref 0.1–1.0)
Monocytes Relative: 8.9 % (ref 3.0–12.0)
Neutro Abs: 5.9 10*3/uL (ref 1.4–7.7)
Neutrophils Relative %: 57.8 % (ref 43.0–77.0)
Platelets: 302 10*3/uL (ref 150.0–400.0)
RBC: 4.81 Mil/uL (ref 3.87–5.11)
RDW: 15 % (ref 11.5–15.5)
WBC: 10.3 10*3/uL (ref 4.0–10.5)

## 2023-10-23 LAB — HEMOGLOBIN A1C: Hgb A1c MFr Bld: 6.5 % (ref 4.6–6.5)

## 2023-10-23 LAB — TSH: TSH: 1.37 u[IU]/mL (ref 0.35–5.50)

## 2023-10-23 MED ORDER — ESCITALOPRAM OXALATE 20 MG PO TABS
20.0000 mg | ORAL_TABLET | Freq: Every day | ORAL | 3 refills | Status: AC
Start: 2023-10-23 — End: ?

## 2023-10-23 MED ORDER — PANTOPRAZOLE SODIUM 40 MG PO TBEC: 40.0000 mg | DELAYED_RELEASE_TABLET | Freq: Every day | ORAL | 3 refills | Status: DC

## 2023-10-23 MED ORDER — PREDNISONE 10 MG PO TABS: ORAL_TABLET | ORAL | 0 refills | Status: DC

## 2023-10-23 MED ORDER — BUPROPION HCL ER (XL) 300 MG PO TB24: 300.0000 mg | ORAL_TABLET | Freq: Every day | ORAL | 3 refills | Status: AC

## 2023-10-23 NOTE — Patient Instructions (Addendum)
Please take prednisone 20 mg daily for 5 days. Continue your Symbicort as you have been taking it.  Rinse and gargle after using. Continue your loratadine and your Atrovent nasal spray as you have been using them Please continue your pantoprazole twice a day. Get your flu shot this fall Please call us if your current symptoms are not improving with this treatment Follow with Dr. Delton Coombes in 1 year, sooner if you have any problems.

## 2023-10-23 NOTE — Progress Notes (Signed)
Subjective:    Patient ID: Katrina Roman, female    DOB: 1976/01/17, 47 y.o.   MRN: [id number]  Chief Complaint  Patient presents with   Annual Exam    Pt here for annual exam - fasting - no concerns, requesting updated handicapped placard. Per pt mammogram done at [facility] 09/2023.   Anxiety   Gastroesophageal Reflux    HPI Discussed the use of AI scribe software for clinical note transcription with the patient, who gave verbal consent to proceed.  History of Present Illness   The patient, with a history of asthma, presents for a follow-up visit after a severe leg injury. The patient slipped on a patch of mud, resulting in a broken tibia and fibula. The injury required multiple surgeries and physical therapy. The patient reports that the recovery process has been slow and challenging, with the injury significantly limiting her mobility. The patient has been using a wheelchair for mobility when going out and a roller for moving around the apartment. The patient reports no other significant health concerns at this time.       Past Medical History:  Diagnosis Date   Allergy    Anxiety    Asthma    Depression    GERD (gastroesophageal reflux disease)    Peripheral eosinophilia    PUD (peptic ulcer disease)    Seasonal allergies    Sleep apnea     Past Surgical History:  Procedure Laterality Date   ESOPHAGOGASTRODUODENOSCOPY  08/16/2010   noraml esophagus,3 1-mm superficial gastric ulcers seen in the antrum, No evidence H pylori   FOOT SURGERY     Right; three screws,chronic frature   FRACTURE SURGERY     NASAL SINUS SURGERY  1997 and 2009   x 2   TONSILLECTOMY      Family History  Problem Relation Age of Onset   Skin cancer Maternal Grandfather    Hypertension Father    Diabetes Father    Hypertension Mother    Arthritis Mother     Social History   Tobacco Use   Smoking status: Never   Smokeless tobacco: Never  Vaping Use   Vaping status: Never Used   Substance Use Topics   Alcohol use: No   Drug use: No     Allergies  Allergen Reactions   Aspirin     REACTION: affects asthma   Cefdinir     Severe stomach cramps per patient, no nausea/vomiting   Cortisone     Swelling, flushing,itching    Review of Systems NEGATIVE UNLESS OTHERWISE INDICATED IN HPI      Objective:     BP 124/88   Pulse 91   Temp 97.7 F (36.5 C)   Ht 5\' 1"  (1.549 m)   Wt 251 lb 12.8 oz (114.2 kg)   SpO2 96%   BMI 47.58 kg/m   Wt Readings from Last 3 Encounters:  10/23/23 252 lb 12.8 oz (114.7 kg)  10/23/23 251 lb 12.8 oz (114.2 kg)  08/28/22 250 lb (113.4 kg)    BP Readings from Last 3 Encounters:  10/23/23 124/84  10/23/23 124/88  08/28/22 (!) 151/96     Physical Exam Vitals and nursing note reviewed.  Constitutional:      Appearance: Normal appearance. She is obese. She is not toxic-appearing.     Comments: Rollator walker   HENT:     Head: Normocephalic and atraumatic.     Right Ear: External ear normal.  Left Ear: External ear normal.     Nose: Nose normal.     Mouth/Throat:     Mouth: Mucous membranes are moist.  Eyes:     Extraocular Movements: Extraocular movements intact.     Conjunctiva/sclera: Conjunctivae normal.     Pupils: Pupils are equal, round, and reactive to light.  Cardiovascular:     Rate and Rhythm: Normal rate and regular rhythm.     Pulses: Normal pulses.     Heart sounds: Normal heart sounds.  Pulmonary:     Effort: Pulmonary effort is normal.     Breath sounds: Normal breath sounds.  Abdominal:     General: Abdomen is flat. Bowel sounds are normal.     Palpations: Abdomen is soft.  Musculoskeletal:        General: Normal range of motion.     Cervical back: Normal range of motion and neck supple.  Skin:    General: Skin is warm and dry.  Neurological:     General: No focal deficit present.     Mental Status: She is alert and oriented to person, place, and time.     Gait: Gait abnormal  (secondary to leg injury).  Psychiatric:        Mood and Affect: Mood normal.        Behavior: Behavior normal.        Assessment & Plan:  Encounter for annual physical exam -     CBC with Differential/Platelet -     Comprehensive metabolic panel -     Lipid panel -     TSH -     Hemoglobin A1c  Elevated LDL cholesterol level -     Lipid panel  Prediabetes -     Comprehensive metabolic panel -     Hemoglobin A1c  Right leg pain  Gastroesophageal reflux disease, unspecified whether esophagitis present -     Pantoprazole Sodium; Take 1 tablet (40 mg total) by mouth daily.  Dispense: 90 tablet; Refill: 3  Anxiety and depression -     Escitalopram Oxalate; Take 1 tablet (20 mg total) by mouth daily.  Dispense: 90 tablet; Refill: 3 -     buPROPion HCl ER (XL); Take 1 tablet (300 mg total) by mouth daily.  Dispense: 90 tablet; Refill: 3   Assessment and Plan    Fractured Tibia and Fibula Sustained in March 2024, with subsequent surgeries and physical therapy. Currently using a roller for mobility and wheelchair for longer distances. No complications such as blood clots reported. -Continue physical therapy as prescribed. -Plan for follow-up with surgeon this month. -Consider weightlifting in the next year for bone strength. -Handicap placard form completed x 6 months  Hyperlipidemia Slightly elevated LDL cholesterol noted in previous labs. -Order lipid panel today to assess current status.  Prediabetes Previous labs showed slightly elevated A1c. -Order A1c today to assess current status.  General Health Maintenance -Mammogram completed last month at [facility], results reported as normal. -Cervical cancer screening up to date. -Plan for Cologuard next year. -Continue current medications, refills sent to pharmacy in [place]. -Schedule follow-up in six months.      Patient Counseling: [x]   Nutrition: Stressed importance of moderation in sodium/caffeine intake,  saturated fat and cholesterol, caloric balance, sufficient intake of fresh fruits, vegetables, and fiber.  [x]   Stressed the importance of regular exercise.   [x]   Substance Abuse: Discussed cessation/primary prevention of tobacco, alcohol, or other drug use; driving or other dangerous activities under the  influence; availability of treatment for abuse.   [x]   Injury prevention: Discussed safety belts, safety helmets, smoke detector, smoking near bedding or upholstery.   []   Sexuality: Discussed sexually transmitted diseases, partner selection, use of condoms, avoidance of unintended pregnancy  and contraceptive alternatives.   [x]   Dental health: Discussed importance of regular tooth brushing, flossing, and dental visits.  [x]   Health maintenance and immunizations reviewed. Please refer to Health maintenance section.        Return in about 6 months (around 04/21/2024) for recheck/follow-up.    Kathren Scearce M Bonny Egger, PA-C

## 2023-10-23 NOTE — Progress Notes (Signed)
I think I printed for [facility] data  Subjective:    Patient ID: Katrina Roman, female    DOB: [birth year], 47 y.o.   MRN: [id number]  Asthma She complains of cough, shortness of breath and wheezing. Pertinent negatives include no ear pain, fever, headaches, postnasal drip, rhinorrhea, sneezing, sore throat or trouble swallowing. Her past medical history is significant for asthma.    ROV 10/19/21 --46 year old woman with a history of stridor and upper airway instability.  Probably also some superimposed mild intermittent asthma.  Both of these are exacerbated by her nasal polyposis, chronic rhinitis, GERD.  When I last saw her she was on scheduled prednisone and was doing a slow taper. Maintenance regimen includes Singulair, loratadine/Zyrtec, chlorpheniramine, Protonix. She has been on Symbicort since September.  She was admitted with a flare of her obstructive lung disease in August, off prednisone. Characterized by wheeze, SOB. She required BiPAP for a period of time. She is doing well since. On Symbicort and off pred as above. GERD seems well controlled. On her allergy regimen, NSW's as well.  She needs a PA for ventolin - has tried and failed proair, proventil, not sure that we have failed generic albuterol HFA.    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  She has chronic rhinitis, nasal polyposis, GERD.  She has had lab work that showed a relative elevated eosinophil count with a normal absolute (6%, 0.4) on 03/21/2022.  She is on symbicort, does benefit from it. Rare albuterol use. No flares since last time. She is on PPI bid, loratadine, atrovent NS.   ROV 10/23/2023 --follow-up visit for pleasant 47 year old woman with a history of mild intermittent asthma and intermittent stridor due to upper airway instability syndrome.  She has chronic rhinitis with allergic nasal polyposis and  also GERD.  Relatively elevated eosinophil count.  Has been managed on Symbicort, PPI, loratadine and Atrovent nasal spray.  She has been doing very well, although for the last few days she has had some increased UA noise and wheeze. She has some associated exertional SOB. Some at night. No GERD right now on PPI.       Objective:   Physical Exam Vitals:   10/23/23 1130 10/23/23 1135  BP: 124/84   Pulse: (!) 109 97  Temp: 98.5 F (36.9 C)   TempSrc: Oral   SpO2: 98%   Weight: 252 lb 12.8 oz (114.7 kg)   Height: 5\' 1"  (1.549 m)    Gen: Pleasant, overwt woman, in no distress,  normal affect  ENT: No lesions,  mouth clear,  oropharynx clear, no postnasal drip  Neck: No JVD, no stridor today  Lungs: No use of accessory muscles, no UA noise  Cardiovascular: RRR, heart sounds normal, no murmur or gallops, no peripheral edema  Musculoskeletal: No deformities, no cyanosis or clubbing  Neuro: alert, awake, non focal  Skin: Warm, no lesions or rash       Assessment & Plan:  Upper airway resistance syndrome With acute flaring, ? Due to more active allergy sx. Plan to treat w short course prednisone  Please take prednisone 20 mg daily for 5 days. Please call us if your current symptoms are not improving with this treatment  Asthma Continue your Symbicort as you have been taking it.  Rinse and gargle after using. Get your flu shot this fall  Allergic rhinitis Continue your loratadine and your  Atrovent nasal spray as you have been using them  GERD Please continue your pantoprazole twice a day.    Levy Pupa, MD, PhD 10/31/2023, 3:33 PM [facility] [phone] or if no answer [phone]

## 2023-10-24 NOTE — Patient Instructions (Signed)
VISIT SUMMARY:  You visited [place] today for a follow-up on your severe leg injury and to review your overall health. We discussed your recovery progress, current mobility aids, and reviewed your recent lab results and general health maintenance.  YOUR PLAN:  -FRACTURED TIBIA AND FIBULA: You broke your tibia and fibula in March 2024, which required multiple surgeries and physical therapy. These bones are in your lower leg. You are currently using a roller for short distances and a wheelchair for longer distances. Continue with your physical therapy as prescribed, follow up with your surgeon this month, and consider weightlifting next year to strengthen your bones.  -HYPERLIPIDEMIA: Hyperlipidemia means you have slightly elevated levels of LDL cholesterol, which can increase your risk of heart disease. We will order a lipid panel today to check your current cholesterol levels.  -PREDIABETES: Prediabetes means your blood sugar levels are higher than normal but not high enough to be classified as diabetes. We will order an A1c test today to assess your current blood sugar levels.  -GENERAL HEALTH MAINTENANCE: Your recent mammogram was normal, and your cervical cancer screening is up to date. We plan to do a Cologuard test next year. Continue taking your current medications, and we have sent refills to your pharmacy in [place].  INSTRUCTIONS:  Please continue with your physical therapy and follow up with your surgeon this month. We have ordered a lipid panel and an A1c test today to assess your cholesterol and blood sugar levels. Schedule your next follow-up appointment in six months.

## 2023-10-31 NOTE — Assessment & Plan Note (Signed)
Continue your loratadine and your Atrovent nasal spray as you have been using them

## 2023-10-31 NOTE — Assessment & Plan Note (Signed)
With acute flaring, ? Due to more active allergy sx. Plan to treat w short course prednisone  Please take prednisone 20 mg daily for 5 days. Please call us if your current symptoms are not improving with this treatment

## 2023-10-31 NOTE — Assessment & Plan Note (Signed)
Please continue your pantoprazole twice a day.

## 2023-10-31 NOTE — Assessment & Plan Note (Signed)
Continue your Symbicort as you have been taking it.  Rinse and gargle after using. Get your flu shot this fall

## 2023-10-31 NOTE — Telephone Encounter (Signed)
Please advise on recent message from pt.

## 2023-11-03 NOTE — Telephone Encounter (Signed)
Yes ok to set up VV

## 2023-11-04 MED ORDER — PREDNISONE 10 MG PO TABS: ORAL_TABLET | ORAL | 0 refills | Status: DC

## 2023-11-24 ENCOUNTER — Telehealth: Payer: Medicaid Other | Admitting: Nurse Practitioner

## 2023-11-24 DIAGNOSIS — J069 Acute upper respiratory infection, unspecified: Secondary | ICD-10-CM | POA: Diagnosis not present

## 2023-11-24 MED ORDER — BENZONATATE 100 MG PO CAPS: 100.0000 mg | ORAL_CAPSULE | Freq: Three times a day (TID) | ORAL | 0 refills | Status: DC | PRN

## 2023-11-24 MED ORDER — IPRATROPIUM BROMIDE 0.03 % NA SOLN: 2.0000 | Freq: Two times a day (BID) | NASAL | 12 refills | Status: DC

## 2023-11-24 NOTE — Progress Notes (Signed)
E-Visit for Upper Respiratory Infection   We are sorry you are not feeling well.  Here is how we plan to help!  Based on what you have shared with me, it looks like you may have a viral upper respiratory infection.  Upper respiratory infections are caused by a large number of viruses; however, rhinovirus is the most common cause.   The sore throat from a virus is typically from post nasal drainage and inflammation. It is less likely to have strep if you have a runny nose and or cough.   Providers prescribe antibiotics to treat infections caused by bacteria. Antibiotics are very powerful in treating bacterial infections when they are used properly. To maintain their effectiveness, they should be used only when necessary. Overuse of antibiotics has resulted in the development of superbugs that are resistant to treatment!    After careful review of your answers, I would not recommend an antibiotic for your condition.  Antibiotics are not effective against viruses and therefore should not be used to treat them. Common examples of infections caused by viruses include colds and flu   Symptoms vary from person to person, with common symptoms including sore throat, cough, fatigue or lack of energy and feeling of general discomfort.  A low-grade fever of up to 100.4 may present, but is often uncommon.  Symptoms vary however, and are closely related to a person's age or underlying illnesses.  The most common symptoms associated with an upper respiratory infection are nasal discharge or congestion, cough, sneezing, headache and pressure in the ears and face.  These symptoms usually persist for about 3 to 10 days, but can last up to 2 weeks.  It is important to know that upper respiratory infections do not cause serious illness or complications in most cases.    Upper respiratory infections can be transmitted from person to person, with the most common method of transmission being a person's hands.  The virus is  able to live on the skin and can infect other persons for up to 2 hours after direct contact.  Also, these can be transmitted when someone coughs or sneezes; thus, it is important to cover the mouth to reduce this risk.  To keep the spread of the illness at bay, good hand hygiene is very important.  This is an infection that is most likely caused by a virus. There are no specific treatments other than to help you with the symptoms until the infection runs its course.  We are sorry you are not feeling well.  Here is how we plan to help!   For nasal congestion, you may use an oral decongestants such as Mucinex D or if you have glaucoma or high blood pressure use plain Mucinex.  Saline nasal spray or nasal drops can help and can safely be used as often as needed for congestion.  For your congestion, I have prescribed Ipratropium Bromide nasal spray 0.03% two sprays in each nostril 2-3 times a day  If you do not have a history of heart disease, hypertension, diabetes or thyroid disease, prostate/bladder issues or glaucoma, you may also use Sudafed to treat nasal congestion.  It is highly recommended that you consult with a pharmacist or your primary care physician to ensure this medication is safe for you to take.     If you have a cough, you may use cough suppressants such as Delsym and Robitussin.  If you have glaucoma or high blood pressure, you can also use Coricidin HBP.  For cough I have prescribed for you A prescription cough medication called Tessalon Perles 100 mg. You may take 1-2 capsules every 8 hours as needed for cough  If you have a sore or scratchy throat, use a saltwater gargle-  to  teaspoon of salt dissolved in a 4-ounce to 8-ounce glass of warm water.  Gargle the solution for approximately 15-30 seconds and then spit.  It is important not to swallow the solution.  You can also use throat lozenges/cough drops and Chloraseptic spray to help with throat pain or discomfort.  Warm or cold  liquids can also be helpful in relieving throat pain.  For headache, pain or general discomfort, you can use Ibuprofen or Tylenol as directed.   Some authorities believe that zinc sprays or the use of Echinacea may shorten the course of your symptoms.   HOME CARE Only take medications as instructed by your medical team. Be sure to drink plenty of fluids. Water is fine as well as fruit juices, sodas and electrolyte beverages. You may want to stay away from caffeine or alcohol. If you are nauseated, try taking small sips of liquids. How do you know if you are getting enough fluid? Your urine should be a pale yellow or almost colorless. Get rest. Taking a steamy shower or using a humidifier may help nasal congestion and ease sore throat pain. You can place a towel over your head and breathe in the steam from hot water coming from a faucet. Using a saline nasal spray works much the same way. Cough drops, hard candies and sore throat lozenges may ease your cough. Avoid close contacts especially the very young and the elderly Cover your mouth if you cough or sneeze Always remember to wash your hands.   GET HELP RIGHT AWAY IF: You develop worsening fever. If your symptoms do not improve within 10 days You develop yellow or green discharge from your nose over 3 days. You have coughing fits You develop a severe head ache or visual changes. You develop shortness of breath, difficulty breathing or start having chest pain Your symptoms persist after you have completed your treatment plan  MAKE SURE YOU  Understand these instructions. Will watch your condition. Will get help right away if you are not doing well or get worse.  Thank you for choosing an e-visit.  Your e-visit answers were reviewed by a board certified advanced clinical practitioner to complete your personal care plan. Depending upon the condition, your plan could have included both over the counter or prescription  medications.  Please review your pharmacy choice. Make sure the pharmacy is open so you can pick up prescription now. If there is a problem, you may contact your provider through [employer] and have the prescription routed to another pharmacy.  Your safety is important to [place]. If you have drug allergies check your prescription carefully.   For the next 24 hours you can use MyChart to ask questions about today's visit, request a non-urgent call back, or ask for a work or school excuse. You will get an email in the next two days asking about your experience. I hope that your e-visit has been valuable and will speed your recovery.  Meds ordered this encounter  Medications   ipratropium (ATROVENT) 0.03 % nasal spray    Sig: Place 2 sprays into both nostrils every 12 (twelve) hours.    Dispense:  30 mL    Refill:  12   benzonatate (TESSALON) 100 MG capsule  Sig: Take 1 capsule (100 mg total) by mouth 3 (three) times daily as needed.    Dispense:  30 capsule    Refill:  0    I spent approximately 5 minutes reviewing the patient's history, current symptoms and coordinating their care today.

## 2023-12-03 ENCOUNTER — Telehealth: Payer: Medicaid Other | Admitting: Nurse Practitioner

## 2023-12-03 DIAGNOSIS — J40 Bronchitis, not specified as acute or chronic: Secondary | ICD-10-CM | POA: Diagnosis not present

## 2023-12-03 MED ORDER — AZITHROMYCIN 250 MG PO TABS: ORAL_TABLET | ORAL | 0 refills | Status: AC

## 2023-12-03 NOTE — Progress Notes (Signed)
E-Visit for Cough   We are sorry that you are not feeling well.  Here is how we plan to help!  Based on your presentation I believe you most likely have A cough due to bacteria.  When patients have a fever and a productive cough with a change in color or increased sputum production, we are concerned about bacterial bronchitis.  If left untreated it can progress to pneumonia.  If your symptoms do not improve with your treatment plan it is important that you contact your provider.   I have prescribed Azithromyin 250 mg: two tablets now and then one tablet daily for 4 additonal days    In addition you may use A prescription cough medication called Tessalon Perles 100mg . You may take 1-2 capsules every 8 hours as needed for your cough.   From your responses in the eVisit questionnaire you describe inflammation in the upper respiratory tract which is causing a significant cough.  This is commonly called Bronchitis and has four common causes:   Allergies Viral Infections Acid Reflux Bacterial Infection Allergies, viruses and acid reflux are treated by controlling symptoms or eliminating the cause. An example might be a cough caused by taking certain blood pressure medications. You stop the cough by changing the medication. Another example might be a cough caused by acid reflux. Controlling the reflux helps control the cough.  USE OF BRONCHODILATOR ("RESCUE") INHALERS: There is a risk from using your bronchodilator too frequently.  The risk is that over-reliance on a medication which only relaxes the muscles surrounding the breathing tubes can reduce the effectiveness of medications prescribed to reduce swelling and congestion of the tubes themselves.  Although you feel brief relief from the bronchodilator inhaler, your asthma may actually be worsening with the tubes becoming more swollen and filled with mucus.  This can delay other crucial treatments, such as oral steroid medications. If you need to use  a bronchodilator inhaler daily, several times per day, you should discuss this with your provider.  There are probably better treatments that could be used to keep your asthma under control.     HOME CARE Only take medications as instructed by your medical team. Complete the entire course of an antibiotic. Drink plenty of fluids and get plenty of rest. Avoid close contacts especially the very young and the elderly Cover your mouth if you cough or cough into your sleeve. Always remember to wash your hands A steam or ultrasonic humidifier can help congestion.   GET HELP RIGHT AWAY IF: You develop worsening fever. You become short of breath You cough up blood. Your symptoms persist after you have completed your treatment plan MAKE SURE YOU  Understand these instructions. Will watch your condition. Will get help right away if you are not doing well or get worse.    Thank you for choosing an e-visit.  Your e-visit answers were reviewed by a board certified advanced clinical practitioner to complete your personal care plan. Depending upon the condition, your plan could have included both over the counter or prescription medications.  Please review your pharmacy choice. Make sure the pharmacy is open so you can pick up prescription now. If there is a problem, you may contact your provider through [employer] and have the prescription routed to another pharmacy.  Your safety is important to [place]. If you have drug allergies check your prescription carefully.   For the next 24 hours you can use MyChart to ask questions about today's visit, request a  non-urgent call back, or ask for a work or school excuse. You will get an email in the next two days asking about your experience. I hope that your e-visit has been valuable and will speed your recovery.   Meds ordered this encounter  Medications   azithromycin (ZITHROMAX) 250 MG tablet    Sig: Take 2 tablets on day 1, then 1 tablet daily on  days 2 through 5    Dispense:  6 tablet    Refill:  0     I spent approximately 5 minutes reviewing the patient's history, current symptoms and coordinating their care today.

## 2023-12-06 ENCOUNTER — Telehealth: Payer: Medicaid Other | Admitting: Physician Assistant

## 2023-12-06 DIAGNOSIS — J45901 Unspecified asthma with (acute) exacerbation: Secondary | ICD-10-CM

## 2023-12-06 NOTE — Progress Notes (Signed)
Because of your symptoms and medication request, I feel your condition warrants further evaluation and I recommend that you be seen in a face to face visit.   NOTE: There will be NO CHARGE for this eVisit   If you are having a true medical emergency please call 911.      For an urgent face to face visit, [place] has eight urgent care centers for your convenience:   NEW!! [facility] at [facility] Get Driving Directions [phone] [address] [place], [address]    [facility] at [facility] Get Driving Directions [phone] [address] [place], [place] [address]   [facility]) Get Driving Directions [phone] [address] [place], [place] [address]  [facility] Center [facility]) Get Driving Directions [phone] [address] [place],  [place]  [address]  [facility] - at [employer]  [phone] [address] [place],  [place] [address]   [facility] Health Urgent Care at [facility] Get Driving Directions [phone] [address] [place], [place] [address]   [facility] at [facility] Get Driving Directions  [phone] [address] [place], [place] [address]   [facility] Health Urgent Care at [facility] Directions [phone] [address], [address] [place], [place] [address]  Your MyChart E-visit questionnaire answers were reviewed by a board certified advanced clinical practitioner to complete your personal care plan based on your specific symptoms.  Thank you for using e-Visits.

## 2023-12-09 ENCOUNTER — Telehealth (INDEPENDENT_AMBULATORY_CARE_PROVIDER_SITE_OTHER): Payer: Medicaid Other | Admitting: Physician Assistant

## 2023-12-09 ENCOUNTER — Encounter: Payer: Self-pay | Admitting: Physician Assistant

## 2023-12-09 ENCOUNTER — Other Ambulatory Visit: Payer: Self-pay | Admitting: Physician Assistant

## 2023-12-09 DIAGNOSIS — K219 Gastro-esophageal reflux disease without esophagitis: Secondary | ICD-10-CM

## 2023-12-09 DIAGNOSIS — J4541 Moderate persistent asthma with (acute) exacerbation: Secondary | ICD-10-CM | POA: Diagnosis not present

## 2023-12-09 MED ORDER — ALBUTEROL SULFATE (2.5 MG/3ML) 0.083% IN NEBU: 2.5000 mg | INHALATION_SOLUTION | Freq: Four times a day (QID) | RESPIRATORY_TRACT | 1 refills | Status: AC | PRN

## 2023-12-09 MED ORDER — PREDNISONE 20 MG PO TABS: 20.0000 mg | ORAL_TABLET | Freq: Two times a day (BID) | ORAL | 0 refills | Status: AC

## 2023-12-09 MED ORDER — VENTOLIN HFA 108 (90 BASE) MCG/ACT IN AERS: 2.0000 | INHALATION_SPRAY | Freq: Four times a day (QID) | RESPIRATORY_TRACT | 2 refills | Status: DC | PRN

## 2023-12-09 MED ORDER — DOXYCYCLINE HYCLATE 100 MG PO TABS: 100.0000 mg | ORAL_TABLET | Freq: Two times a day (BID) | ORAL | 0 refills | Status: AC

## 2023-12-09 NOTE — Progress Notes (Signed)
   Virtual Visit via Video Note  I connected with  Katrina Roman  on 12/09/23 at 10:00 AM EST by a video enabled telemedicine application and verified that I am speaking with the correct person using two identifiers.  Location: Patient: home Provider: Nature conservation officer at [employer] Persons present: Patient and myself   I discussed the limitations of evaluation and management by telemedicine and the availability of in person appointments. The patient expressed understanding and agreed to proceed.   History of Present Illness:  Discussed the use of AI scribe software for clinical note transcription with the patient, who gave verbal consent to proceed.  History of Present Illness   The patient, with a history of moderate persistent asthma, presents with ongoing chest congestion and wheezing for the past couple of weeks. She reports shortness of breath when active and during coughing episodes. The cough is productive, but she is not able to expectorate. She has been on a course of Z-Pak, which she finished recently, but she reports no significant improvement in her symptoms. She also has a history of high eosinophils and was prescribed prednisone in early November for similar symptoms. She has been managing her asthma with Ventolin HFA and liquid albuterol for her nebulizer. She also reports using Mucinex when she had a head cold, which she believes may have contributed to her current chest congestion.        Observations/Objective:   Gen: Awake, alert, no acute distress Resp: Breathing is even and non-labored, productive cough, some expiratory wheezing heard while she is talking Psych: calm/pleasant demeanor Neuro: Alert and Oriented x 3, + facial symmetry, speech is clear.   Assessment and Plan:  Assessment and Plan    Moderate Persistent Asthma with Exacerbation Chest congestion and wheezing for a couple of weeks. Shortness of breath with activity and coughing. Recent  course of Z-Pak completed with minimal improvement. History of elevated eosinophils. -Start Prednisone 20mg  twice daily for 4-5 days. -Start Doxycycline 100 bid x 7 days  -Refill Ventolin HFA 18g inhaler. -Refill Albuterol nebulizer solution. -Consider ENT consultation for upper airway evaluation. -Continue Mucinex as needed. -Encourage fluid intake. -Follow-up with pulmonologist for possible Airsupra discussion.    Follow Up Instructions:    I discussed the assessment and treatment plan with the patient. The patient was provided an opportunity to ask questions and all were answered. The patient agreed with the plan and demonstrated an understanding of the instructions.   The patient was advised to call back or seek an in-person evaluation if the symptoms worsen or if the condition fails to improve as anticipated.  Ashana Tullo M Isham Smitherman, PA-C

## 2023-12-19 ENCOUNTER — Encounter: Payer: Self-pay | Admitting: Physician Assistant

## 2023-12-19 NOTE — Telephone Encounter (Signed)
Please see patient message and advise.

## 2023-12-25 ENCOUNTER — Telehealth: Payer: Medicaid Other | Admitting: Family Medicine

## 2023-12-25 DIAGNOSIS — J45901 Unspecified asthma with (acute) exacerbation: Secondary | ICD-10-CM

## 2023-12-25 NOTE — Progress Notes (Signed)
 Because you have had several treatments for asthma, bronchitis and flares this last few weeks to month- in a virtual setting, your asthma does not appear to be under control, you need to be seen in person. Your condition warrants further evaluation and I recommend that you be seen in a face to face visit at your PCP office and or local Urgent Care.   NOTE: There will be NO CHARGE for this eVisit   If you are having a true medical emergency please call 911.

## 2023-12-29 ENCOUNTER — Telehealth: Payer: Medicaid Other | Admitting: Physician Assistant

## 2023-12-29 DIAGNOSIS — K0889 Other specified disorders of teeth and supporting structures: Secondary | ICD-10-CM

## 2023-12-29 MED ORDER — CLINDAMYCIN HCL 300 MG PO CAPS: 300.0000 mg | ORAL_CAPSULE | Freq: Three times a day (TID) | ORAL | 0 refills | Status: AC

## 2023-12-29 NOTE — Progress Notes (Signed)
 E-Visit for Dental Pain  We are sorry that you are not feeling well.  Here is how we plan to help!  Based on what you have shared with me in the questionnaire, it sounds like you have a possible dental infection.   Clindamycin  300mg  3 times a day for 7 days and Ibuprofen 600mg  3 times a day for 7 days for discomfort  Recommend Tylenol  as needed for pain.   It is imperative that you see a dentist within 10 days of this eVisit to determine the cause of the dental pain and be sure it is adequately treated  A toothache or tooth pain is caused when the nerve in the root of a tooth or surrounding a tooth is irritated. Dental (tooth) infection, decay, injury, or loss of a tooth are the most common causes of dental pain. Pain may also occur after an extraction (tooth is pulled out). Pain sometimes originates from other areas and radiates to the jaw, thus appearing to be tooth pain.Bacteria growing inside your mouth can contribute to gum disease and dental decay, both of which can cause pain. A toothache occurs from inflammation of the central portion of the tooth called pulp. The pulp contains nerve endings that are very sensitive to pain. Inflammation to the pulp or pulpitis may be caused by dental cavities, trauma, and infection.    HOME CARE:   For toothaches: Over-the-counter pain medications such as acetaminophen  or ibuprofen may be used. Take these as directed on the package while you arrange for a dental appointment. Avoid very cold or hot foods, because they may make the pain worse. You may get relief from biting on a cotton ball soaked in oil of cloves. You can get oil of cloves at most drug stores.  For jaw pain:  Aspirin may be helpful for problems in the joint of the jaw in adults. If pain happens every time you open your mouth widely, the temporomandibular joint (TMJ) may be the source of the pain. Yawning or taking a large bite of food may worsen the pain. An appointment with your doctor  or dentist will help you find the cause.     GET HELP RIGHT AWAY IF:  You have a high fever or chills If you have had a recent head or face injury and develop headache, light headedness, nausea, vomiting, or other symptoms that concern you after an injury to your face or mouth, you could have a more serious injury in addition to your dental injury. A facial rash associated with a toothache: This condition may improve with medication. Contact your doctor for them to decide what is appropriate. Any jaw pain occurring with chest pain: Although jaw pain is most commonly caused by dental disease, it is sometimes referred pain from other areas. People with heart disease, especially people who have had stents placed, people with diabetes, or those who have had heart surgery may have jaw pain as a symptom of heart attack or angina. If your jaw or tooth pain is associated with lightheadedness, sweating, or shortness of breath, you should see a doctor as soon as possible. Trouble swallowing or excessive pain or bleeding from gums: If you have a history of a weakened immune system, diabetes, or steroid use, you may be more susceptible to infections. Infections can often be more severe and extensive or caused by unusual organisms. Dental and gum infections in people with these conditions may require more aggressive treatment. An abscess may need draining or IV antibiotics,  for example.  MAKE SURE YOU   Understand these instructions. Will watch your condition. Will get help right away if you are not doing well or get worse.  Thank you for choosing an e-visit.  Your e-visit answers were reviewed by a board certified advanced clinical practitioner to complete your personal care plan. Depending upon the condition, your plan could have included both over the counter or prescription medications.  Please review your pharmacy choice. Make sure the pharmacy is open so you can pick up prescription now. If there is a  problem, you may contact your provider through [employer] and have the prescription routed to another pharmacy.  Your safety is important to us . If you have drug allergies check your prescription carefully.   For the next 24 hours you can use MyChart to ask questions about today's visit, request a non-urgent call back, or ask for a work or school excuse. You will get an email in the next two days asking about your experience. I hope that your e-visit has been valuable and will speed your recovery.   I have spent 5 minutes in review of e-visit questionnaire, review and updating patient chart, medical decision making and response to patient.   Harlene PEDLAR Ward, PA-C

## 2024-01-05 ENCOUNTER — Other Ambulatory Visit (HOSPITAL_COMMUNITY): Payer: Self-pay

## 2024-01-12 NOTE — Telephone Encounter (Signed)
Please contact patient and schedule Hosp follow up

## 2024-01-12 NOTE — Telephone Encounter (Signed)
Please see patient message as Katrina Roman; will schedule Hosp f/u

## 2024-01-13 ENCOUNTER — Telehealth (INDEPENDENT_AMBULATORY_CARE_PROVIDER_SITE_OTHER): Payer: Medicaid Other | Admitting: Physician Assistant

## 2024-01-13 DIAGNOSIS — J45901 Unspecified asthma with (acute) exacerbation: Secondary | ICD-10-CM | POA: Diagnosis not present

## 2024-01-13 DIAGNOSIS — K219 Gastro-esophageal reflux disease without esophagitis: Secondary | ICD-10-CM

## 2024-01-13 DIAGNOSIS — G4733 Obstructive sleep apnea (adult) (pediatric): Secondary | ICD-10-CM

## 2024-01-13 DIAGNOSIS — K760 Fatty (change of) liver, not elsewhere classified: Secondary | ICD-10-CM | POA: Insufficient documentation

## 2024-01-13 DIAGNOSIS — R739 Hyperglycemia, unspecified: Secondary | ICD-10-CM

## 2024-01-13 MED ORDER — BLOOD GLUCOSE MONITORING SUPPL DEVI: 1.0000 | Freq: Three times a day (TID) | 0 refills | Status: DC

## 2024-01-13 MED ORDER — LANCETS MISC. MISC: 1.0000 | Freq: Three times a day (TID) | 0 refills | Status: AC

## 2024-01-13 MED ORDER — BLOOD GLUCOSE TEST VI STRP: 1.0000 | ORAL_STRIP | Freq: Three times a day (TID) | 0 refills | Status: AC

## 2024-01-13 MED ORDER — LANCET DEVICE MISC: 1.0000 | Freq: Three times a day (TID) | 0 refills | Status: AC

## 2024-01-13 NOTE — Patient Instructions (Signed)
VISIT SUMMARY:  During today's visit, we discussed your ongoing respiratory symptoms, including the sensation of something in your chest and the need to clear your throat. We also reviewed your history of asthma, recent hospitalization for pneumonia, and persistent feelings of stomach fullness and acid reflux. Additionally, we addressed your sleep apnea and the impact of prednisone on your blood glucose levels.  YOUR PLAN:  -ASTHMA: Asthma is a condition where your airways narrow and swell, making it difficult to breathe. Your asthma has been poorly controlled, leading to frequent flare-ups and hospitalizations. We will continue your current inhaler and nebulizer treatments and consider starting new medications like Xolair or Dupixent after consulting with your pulmonologist. Please follow up with pulmonology in March.  -GASTROESOPHAGEAL REFLUX DISEASE (GERD): GERD is a condition where stomach acid frequently flows back into the tube connecting your mouth and stomach, causing discomfort. You will continue taking Omeprazole 40mg  daily. Recommend seeing [facility].   -PREDNISONE-INDUCED HYPERGLYCEMIA: Prednisone-induced hyperglycemia is high blood sugar caused by the steroid medication prednisone. You should monitor your blood glucose levels at home, especially while taking prednisone, and inform us if your fasting blood glucose levels remain high.  -SLEEP APNEA: Sleep apnea is a sleep disorder where breathing repeatedly stops and starts. Given your worsening symptoms at night, we may need to repeat your sleep study to reassess your condition.  -ABDOMINAL FULLNESS: The chronic sensation of fullness in your stomach may not be fully explained by GERD. Recommend seeing [facility].   INSTRUCTIONS:  Please follow up with your pulmonologist in March for further evaluation of your asthma and potential new treatments. Monitor your blood glucose levels at home and notify us if they remain high. Consider  scheduling an appointment with a gastroenterologist for further evaluation of your GERD and abdominal fullness. We may also need to repeat your sleep study to reassess your sleep apnea.

## 2024-01-13 NOTE — Progress Notes (Signed)
Virtual Visit via Video Note  I connected with  Elmer Ramp  on 01/13/24 at 10:00 AM EST by a video enabled telemedicine application and verified that I am speaking with the correct person using two identifiers.  Location: Patient: home Provider: Nature conservation officer at [employer] Persons present: Patient and myself   I discussed the limitations of evaluation and management by telemedicine and the availability of in person appointments. The patient expressed understanding and agreed to proceed.   History of Present Illness:  Discussed the use of AI scribe software for clinical note transcription with the patient, who gave verbal consent to proceed.  History of Present Illness   Shamere, a patient with a history of asthma, presents with recurring respiratory symptoms that have been worsening over the past couple of months. She reports feeling great while on antibiotics but experiences a decline in her condition a few days after completing the course. She describes a sensation of something in her chest, preventing her from breathing adequately. This feeling is not alleviated by her inhalers or nebulizer treatments. She also mentions a constant need to clear her throat and a non-productive cough.  In addition to her respiratory symptoms, Reta reports a persistent feeling of fullness in her stomach, even when she hasn't eaten. She has been experiencing this for several years and feels that it contributes to her difficulty breathing. She also mentions having severe acid reflux, which has improved with an increased dose of her medication.  Taniesha was recently hospitalized due to these symptoms in [place] where she lives. Chest CT showed R atelectasis, but no pneumonia, and fatty liver. During her hospital stay, all her blood work results were abnormal pt reports. She was discharged with a prescription for a month-long course of prednisone.  Analycia also has a history of sleep apnea and uses a  mouth guard at night. She mentions that her symptoms are usually worse at night and during the fall season. She is due for a sleep study and has a follow-up appointment with a pulmonologist.        Observations/Objective:   Gen: Awake, alert, no acute distress Resp: Breathing is even and non-labored Psych: calm/pleasant demeanor Neuro: Alert and Oriented x 3, + facial symmetry, speech is clear.   Assessment and Plan:  Assessment and Plan    Asthma Poor control with recurrent exacerbations requiring hospitalization and prolonged prednisone use. 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 -Continue current inhaler and nebulizer treatments. -Consider initiation of Xolair or Dupixent pending pulmonology consultation. -Schedule follow-up with pulmonology in March.  Gastroesophageal Reflux Disease (GERD) Chronic symptoms with sensation of fullness and potential impact on asthma control. -Continue Omeprazole 40mg  daily. -Consider referral to gastroenterology for endoscopy. Recommend she have [facility] pulmonology refer her in their system as this is close to her home.  Prednisone-induced Hyperglycemia Elevated blood glucose levels noted during hospitalization and on prolonged prednisone therapy. -Monitor blood glucose levels at home, particularly during prednisone use. -Notify provider if fasting blood glucose levels are consistently high.  Sleep Apnea History of sleep study with use of oral appliance. Worsening of respiratory symptoms at night. -Sleep study is in the works   Abdominal Fullness Chronic sensation of fullness in the stomach, not fully explained by GERD. -Consider referral to gastroenterology for further evaluation, including possible endoscopy and colonoscopy.    Follow Up Instructions:   F/up via Mychart with updates in a few weeks fro me. Strict ER RTC precautions.    I discussed the assessment  and treatment plan with the patient. The patient was provided an opportunity to  ask questions and all were answered. The patient agreed with the plan and demonstrated an understanding of the instructions.   The patient was advised to call back or seek an in-person evaluation if the symptoms worsen or if the condition fails to improve as anticipated.  Raynesha Tiedt M Bronwen Pendergraft, PA-C

## 2024-03-01 ENCOUNTER — Other Ambulatory Visit: Payer: Self-pay | Admitting: Emergency Medicine

## 2024-03-01 ENCOUNTER — Other Ambulatory Visit (HOSPITAL_COMMUNITY): Payer: Self-pay

## 2024-03-01 ENCOUNTER — Other Ambulatory Visit: Payer: Self-pay

## 2024-03-01 ENCOUNTER — Other Ambulatory Visit: Payer: Self-pay | Admitting: Physician Assistant

## 2024-03-01 MED ORDER — BUDESONIDE-FORMOTEROL FUMARATE 80-4.5 MCG/ACT IN AERO
2.0000 | INHALATION_SPRAY | Freq: Two times a day (BID) | RESPIRATORY_TRACT | 0 refills | Status: DC
  Filled 2024-03-01: qty 10.2, 30d supply, fill #0

## 2024-03-02 ENCOUNTER — Encounter: Payer: Self-pay | Admitting: Pharmacist

## 2024-03-02 ENCOUNTER — Other Ambulatory Visit: Payer: Self-pay

## 2024-03-03 ENCOUNTER — Other Ambulatory Visit: Payer: Self-pay

## 2024-03-10 ENCOUNTER — Encounter: Payer: Self-pay | Admitting: Physician Assistant

## 2024-03-11 ENCOUNTER — Other Ambulatory Visit: Payer: Self-pay

## 2024-03-11 DIAGNOSIS — K219 Gastro-esophageal reflux disease without esophagitis: Secondary | ICD-10-CM

## 2024-03-11 MED ORDER — PANTOPRAZOLE SODIUM 40 MG PO TBEC: 40.0000 mg | DELAYED_RELEASE_TABLET | Freq: Every day | ORAL | 0 refills | Status: DC

## 2024-03-12 ENCOUNTER — Encounter: Payer: Self-pay | Admitting: Emergency Medicine

## 2024-03-12 DIAGNOSIS — K219 Gastro-esophageal reflux disease without esophagitis: Secondary | ICD-10-CM

## 2024-03-12 DIAGNOSIS — J4541 Moderate persistent asthma with (acute) exacerbation: Secondary | ICD-10-CM

## 2024-03-22 ENCOUNTER — Other Ambulatory Visit: Payer: Self-pay

## 2024-03-22 ENCOUNTER — Encounter: Payer: Self-pay | Admitting: Physician Assistant

## 2024-03-22 DIAGNOSIS — Z1211 Encounter for screening for malignant neoplasm of colon: Secondary | ICD-10-CM

## 2024-03-22 NOTE — Telephone Encounter (Signed)
 OK to refill all of these for 90 days as she has requested. Thanks./

## 2024-03-23 MED ORDER — LORATADINE 10 MG PO TABS: 10.0000 mg | ORAL_TABLET | Freq: Every day | ORAL | 0 refills | Status: DC

## 2024-03-23 MED ORDER — VENTOLIN HFA 108 (90 BASE) MCG/ACT IN AERS: 2.0000 | INHALATION_SPRAY | Freq: Four times a day (QID) | RESPIRATORY_TRACT | 2 refills | Status: DC | PRN

## 2024-03-23 MED ORDER — PANTOPRAZOLE SODIUM 40 MG PO TBEC: 40.0000 mg | DELAYED_RELEASE_TABLET | Freq: Every day | ORAL | 0 refills | Status: DC

## 2024-03-23 MED ORDER — BUDESONIDE-FORMOTEROL FUMARATE 80-4.5 MCG/ACT IN AERO: 2.0000 | INHALATION_SPRAY | Freq: Two times a day (BID) | RESPIRATORY_TRACT | 1 refills | Status: DC

## 2024-03-23 NOTE — Addendum Note (Signed)
 Addended byFloydene Flock, Colbert Curenton A on: 03/23/2024 08:51 AM   Modules accepted: Orders

## 2024-04-15 ENCOUNTER — Other Ambulatory Visit: Payer: Self-pay | Admitting: Emergency Medicine

## 2024-04-15 LAB — COLOGUARD: COLOGUARD: NEGATIVE

## 2024-04-16 ENCOUNTER — Encounter: Payer: Self-pay | Admitting: Physician Assistant

## 2024-04-21 ENCOUNTER — Ambulatory Visit: Payer: Medicaid Other | Admitting: Physician Assistant

## 2024-05-05 ENCOUNTER — Encounter: Payer: Self-pay | Admitting: Physician Assistant

## 2024-05-05 NOTE — Telephone Encounter (Signed)
 Please see pt request and advise on permanent Handicap placard

## 2024-05-28 ENCOUNTER — Ambulatory Visit: Admitting: Physician Assistant

## 2024-06-19 ENCOUNTER — Other Ambulatory Visit: Payer: Self-pay | Admitting: Emergency Medicine

## 2024-06-19 DIAGNOSIS — K219 Gastro-esophageal reflux disease without esophagitis: Secondary | ICD-10-CM

## 2024-06-24 ENCOUNTER — Telehealth: Admitting: Physician Assistant

## 2024-06-24 DIAGNOSIS — J45901 Unspecified asthma with (acute) exacerbation: Secondary | ICD-10-CM

## 2024-06-24 MED ORDER — PREDNISONE 20 MG PO TABS: 40.0000 mg | ORAL_TABLET | Freq: Every day | ORAL | 0 refills | Status: DC

## 2024-06-24 NOTE — Progress Notes (Signed)
 I have spent 5 minutes in review of e-visit questionnaire, review and updating patient chart, medical decision making and response to patient.   Piedad Climes, PA-C

## 2024-06-24 NOTE — Progress Notes (Signed)

## 2024-06-27 ENCOUNTER — Telehealth: Admitting: Family

## 2024-06-27 DIAGNOSIS — B370 Candidal stomatitis: Secondary | ICD-10-CM

## 2024-06-27 MED ORDER — NYSTATIN 100000 UNIT/ML MT SUSP: 5.0000 mL | Freq: Four times a day (QID) | OROMUCOSAL | 0 refills | Status: DC

## 2024-06-27 NOTE — Progress Notes (Signed)
 E-Visit Treatment for Thrush symptoms  We are sorry that you are not feeling well. Here is how we plan to help!  Based on what you have shared with me, it appears that you have Thrush.  Celestino is a fungal (yeast) infection that can grow in your mouth, throat, and other parts of your body. With oral thrush (oral candidiasis), you may develop white, raised, cottage cheese-like lesions (spots) on your tongue and cheeks. Celestino can quickly become irritated and cause mouth pain and redness. Thrush usually develops suddenly. A common sign is the presence of creamy white, slightly raised lesions in your mouth -- usually on your tongue or inner cheeks. You may also have lesions on the roof of your mouth, gums, tonsils, or back of your throat.   Other symptoms may include: Redness and soreness inside and at the corners of your mouth Loss of sense of taste ([place]) Cottony feeling in your mouth  The lesions can hurt and may bleed a little when you scrape them or brush your teeth. In severe cases, the lesions can spread into your esophagus and cause: Pain or difficulty swallowing A feeling that food gets stuck in your throat or mid-chest area Fever, if the infection spreads beyond your esophagus  Most people have small amounts of the Candida fungus in their mouth, digestive tract and skin. When illnesses, stress or medications disturb this balance, the fungus grows out of control and causes thrush.  Medications that can make yeast flourish and cause infection include: Corticosteroids Inhalers Antibiotics Birth Control Pills  Celestino can be contagious to those at risk (like people with weakened immune systems or who take certain medications). In people with healthy immune systems, it's unusual to pass thrush through kissing or other close contact. In most cases, thrush isn't particularly contagious (meaning, it doesn't spread from person to person), but it is transmittable (meaning, you can catch it  in other ways, like through saliva when you are immunocompromised).  I have prescribed I have prescribed an antifungal - Nystatin  suspension 100,000 units/mL Swish and swallow 5mL every 6 hours for up to 10-14 days  Prevention: Practice good oral hygiene. See your dentist regularly. This is especially important if you have diabetes or wear dentures. Limit the amount of sugar and yeast-containing foods you eat. Foods such as bread, beer and wine encourage Candida growth. Avoid smoking and other tobacco use. Ask your healthcare provider about ways to help you quit smoking (We do offer a smoking cessation program through the [facility] that you can schedule on your time through MyChart). With treatment, thrush usually goes away within one to two weeks. But if your symptoms linger or get worse, please seek in-person evaluation.  Home Care: Swish with warm saltwater. Take probiotics. Eat yogurt that contains healthy bacteria.  GET HELP RIGHT AWAY IF: Ulcers that are spreading, are very large or particularly painful Ulcers last longer than one week without improving on treatment If you develop a fever, swollen glands and begin to feel unwell  MAKE SURE YOU: Understand these instructions Will watch your condition. Will get help right away if you are not doing well or get worse.  Thank you for choosing an e-visit!  Your e-visit answers were reviewed by a board certified advanced clinical practitioner to complete your personal care plan. Depending upon the condition, your plan could have included both over the counter or prescription medications.  Please review your pharmacy choice. Make sure the pharmacy is open so  you can pick up prescription now. If there is a problem, you may contact your provider through [employer] and have the prescription routed to another pharmacy. Your safety is important to us . If you have drug allergies, check your prescription  carefully.  For the next 24 hours you can use MyChart to ask questions about today's visit, request a non-urgent call back, or ask for a work or school excuse.  You will get an email in the next two days asking about your experience. I hope that your e-visit has been valuable and will speed your recovery.  Approximately 5 minutes was spent documenting and reviewing patient's chart.

## 2024-07-05 ENCOUNTER — Ambulatory Visit: Admitting: Physician Assistant

## 2024-07-30 ENCOUNTER — Encounter: Payer: Self-pay | Admitting: Emergency Medicine

## 2024-07-30 NOTE — Telephone Encounter (Signed)
 Form printed, and filled out- just needing signature and date from Dr. Shelah- I have placed in his lookat cabinet. Please let us  know once signed. Thanks!

## 2024-07-31 ENCOUNTER — Other Ambulatory Visit: Payer: Self-pay | Admitting: Emergency Medicine

## 2024-08-03 ENCOUNTER — Telehealth: Payer: No Typology Code available for payment source | Admitting: Physician Assistant

## 2024-08-03 DIAGNOSIS — H6991 Unspecified Eustachian tube disorder, right ear: Secondary | ICD-10-CM | POA: Diagnosis not present

## 2024-08-04 MED ORDER — FLUTICASONE PROPIONATE 50 MCG/ACT NA SUSP: 2.0000 | Freq: Every day | NASAL | 0 refills | Status: AC

## 2024-08-04 NOTE — Progress Notes (Signed)

## 2024-08-21 ENCOUNTER — Other Ambulatory Visit: Payer: Self-pay | Admitting: Emergency Medicine

## 2024-08-21 DIAGNOSIS — J4541 Moderate persistent asthma with (acute) exacerbation: Secondary | ICD-10-CM

## 2024-08-24 ENCOUNTER — Encounter: Payer: Self-pay | Admitting: Emergency Medicine

## 2024-08-26 DIAGNOSIS — L659 Nonscarring hair loss, unspecified: Secondary | ICD-10-CM | POA: Diagnosis not present

## 2024-08-26 DIAGNOSIS — F419 Anxiety disorder, unspecified: Secondary | ICD-10-CM | POA: Diagnosis not present

## 2024-08-26 DIAGNOSIS — F32A Depression, unspecified: Secondary | ICD-10-CM | POA: Diagnosis not present

## 2024-08-26 DIAGNOSIS — Z23 Encounter for immunization: Secondary | ICD-10-CM | POA: Diagnosis not present

## 2024-08-26 DIAGNOSIS — J0110 Acute frontal sinusitis, unspecified: Secondary | ICD-10-CM | POA: Diagnosis not present

## 2024-08-27 ENCOUNTER — Telehealth: Payer: Self-pay

## 2024-08-27 MED ORDER — PREDNISONE 10 MG PO TABS: ORAL_TABLET | ORAL | 0 refills | Status: DC

## 2024-08-27 NOTE — Telephone Encounter (Signed)
I sent script to pharmacy.

## 2024-08-27 NOTE — Telephone Encounter (Signed)
 Hello!  I was wondering of Dr B could call in a round of prednisone  for me.  I have an appointment with him in a few weeks for my yearly checkup, but I am having my normal fall flare up.   If so, the [facility] [address] in [place] [facility]).  Thank you!  Please advise

## 2024-08-27 NOTE — Telephone Encounter (Signed)
 Called to inform pt the rx was sent to pharm. NFN

## 2024-10-06 ENCOUNTER — Encounter: Payer: Self-pay | Admitting: Emergency Medicine

## 2024-10-13 ENCOUNTER — Ambulatory Visit: Admitting: Emergency Medicine

## 2024-10-13 DIAGNOSIS — Z131 Encounter for screening for diabetes mellitus: Secondary | ICD-10-CM | POA: Diagnosis not present

## 2024-10-13 DIAGNOSIS — Z0000 Encounter for general adult medical examination without abnormal findings: Secondary | ICD-10-CM | POA: Diagnosis not present

## 2024-10-17 ENCOUNTER — Other Ambulatory Visit: Payer: Self-pay | Admitting: Emergency Medicine

## 2024-10-19 DIAGNOSIS — E7800 Pure hypercholesterolemia, unspecified: Secondary | ICD-10-CM | POA: Diagnosis not present

## 2024-10-19 DIAGNOSIS — Z0000 Encounter for general adult medical examination without abnormal findings: Secondary | ICD-10-CM | POA: Diagnosis not present

## 2024-10-19 DIAGNOSIS — R7303 Prediabetes: Secondary | ICD-10-CM | POA: Diagnosis not present

## 2024-11-02 DIAGNOSIS — Z30011 Encounter for initial prescription of contraceptive pills: Secondary | ICD-10-CM | POA: Diagnosis not present

## 2024-11-02 DIAGNOSIS — Z1231 Encounter for screening mammogram for malignant neoplasm of breast: Secondary | ICD-10-CM | POA: Diagnosis not present

## 2024-11-02 DIAGNOSIS — Z30432 Encounter for removal of intrauterine contraceptive device: Secondary | ICD-10-CM | POA: Diagnosis not present

## 2024-11-16 ENCOUNTER — Telehealth: Admitting: Physician Assistant

## 2024-11-16 DIAGNOSIS — B9789 Other viral agents as the cause of diseases classified elsewhere: Secondary | ICD-10-CM

## 2024-11-16 DIAGNOSIS — J019 Acute sinusitis, unspecified: Secondary | ICD-10-CM

## 2024-11-16 MED ORDER — IPRATROPIUM BROMIDE 0.03 % NA SOLN: 2.0000 | Freq: Two times a day (BID) | NASAL | 0 refills | Status: AC

## 2024-11-16 MED ORDER — PREDNISONE 10 MG (21) PO TBPK: ORAL_TABLET | ORAL | 0 refills | Status: AC

## 2024-11-16 NOTE — Progress Notes (Signed)
 We are sorry that you are not feeling well.  Here is how we plan to help!  Based on what you have shared with me it looks like you have sinusitis.  Sinusitis is inflammation and infection in the sinus cavities of the head.  Based on your presentation I believe you most likely have Acute Viral Sinusitis.This is an infection most likely caused by a virus. There is not specific treatment for viral sinusitis other than to help you with the symptoms until the infection runs its course.  You may use an oral decongestant such as Mucinex D or if you have glaucoma or high blood pressure use plain Mucinex. Saline nasal spray help and can safely be used as often as needed for congestion, I have prescribed: Ipratropium Bromide nasal spray 0.03% 2 sprays in eah nostril 2-3 times a day.  I have also sent in a prednisone  pack to take as directed.  Some authorities believe that zinc sprays or the use of Echinacea may shorten the course of your symptoms.  Sinus infections are not as easily transmitted as other respiratory infection, however we still recommend that you avoid close contact with loved ones, especially the very young and elderly.  Remember to wash your hands thoroughly throughout the day as this is the number one way to prevent the spread of infection!  Home Care: Only take medications as instructed by your medical team. Do not take these medications with alcohol. A steam or ultrasonic humidifier can help congestion.  You can place a towel over your head and breathe in the steam from hot water coming from a faucet. Avoid close contacts especially the very young and the elderly. Cover your mouth when you cough or sneeze. Always remember to wash your hands.  Get Help Right Away If: You develop worsening fever or sinus pain. You develop a severe head ache or visual changes. Your symptoms persist after you have completed your treatment plan.  Make sure you Understand these instructions. Will watch  your condition. Will get help right away if you are not doing well or get worse.  Your e-visit answers were reviewed by a board certified advanced clinical practitioner to complete your personal care plan.  Depending on the condition, your plan could have included both over the counter or prescription medications.  If there is a problem please reply  once you have received a response from your provider.  Your safety is important to us .  If you have drug allergies check your prescription carefully.    You can use MyChart to ask questions about today's visit, request a non-urgent call back, or ask for a work or school excuse for 24 hours related to this e-Visit. If it has been greater than 24 hours you will need to follow up with your provider, or enter a new e-Visit to address those concerns.  You will get an e-mail in the next two days asking about your experience.  I hope that your e-visit has been valuable and will speed your recovery. Thank you for using e-visits.  I have spent 5 minutes in review of e-visit questionnaire, review and updating patient chart, medical decision making and response to patient.   Elsie Velma Lunger, PA-C
